# Patient Record
Sex: Female | Born: 1950 | Race: White | Hispanic: No | Marital: Married | Smoking: Never smoker
Health system: Southern US, Community
[De-identification: ages and names within clinical notes are randomized; demographics above are authoritative.]

## PROBLEM LIST (undated history)

## (undated) DIAGNOSIS — Z8679 Personal history of other diseases of the circulatory system: Secondary | ICD-10-CM

## (undated) DIAGNOSIS — Z9289 Personal history of other medical treatment: Secondary | ICD-10-CM

## (undated) DIAGNOSIS — Z860100 Personal history of colon polyps, unspecified: Secondary | ICD-10-CM

## (undated) DIAGNOSIS — I83813 Varicose veins of bilateral lower extremities with pain: Secondary | ICD-10-CM

## (undated) DIAGNOSIS — J4520 Mild intermittent asthma, uncomplicated: Secondary | ICD-10-CM

## (undated) DIAGNOSIS — Z973 Presence of spectacles and contact lenses: Secondary | ICD-10-CM

## (undated) DIAGNOSIS — E890 Postprocedural hypothyroidism: Secondary | ICD-10-CM

## (undated) DIAGNOSIS — I219 Acute myocardial infarction, unspecified: Secondary | ICD-10-CM

## (undated) DIAGNOSIS — E785 Hyperlipidemia, unspecified: Secondary | ICD-10-CM

## (undated) DIAGNOSIS — Z8639 Personal history of other endocrine, nutritional and metabolic disease: Secondary | ICD-10-CM

## (undated) DIAGNOSIS — R112 Nausea with vomiting, unspecified: Secondary | ICD-10-CM

## (undated) DIAGNOSIS — H814 Vertigo of central origin: Secondary | ICD-10-CM

## (undated) DIAGNOSIS — G4733 Obstructive sleep apnea (adult) (pediatric): Secondary | ICD-10-CM

## (undated) DIAGNOSIS — S83206A Unspecified tear of unspecified meniscus, current injury, right knee, initial encounter: Secondary | ICD-10-CM

## (undated) DIAGNOSIS — Z9889 Other specified postprocedural states: Secondary | ICD-10-CM

## (undated) DIAGNOSIS — K219 Gastro-esophageal reflux disease without esophagitis: Secondary | ICD-10-CM

## (undated) HISTORY — DX: Acute myocardial infarction, unspecified: I21.9

## (undated) HISTORY — DX: Hyperlipidemia, unspecified: E78.5

## (undated) HISTORY — DX: Vertigo of central origin: H81.4

## (undated) HISTORY — PX: LAPAROSCOPIC GASTRIC SLEEVE RESECTION: SHX5895

## (undated) HISTORY — PX: LAPAROSCOPIC CHOLECYSTECTOMY: SUR755

## (undated) HISTORY — DX: Gastro-esophageal reflux disease without esophagitis: K21.9

## (undated) HISTORY — DX: Varicose veins of bilateral lower extremities with pain: I83.813

## (undated) HISTORY — PX: TONSILLECTOMY AND ADENOIDECTOMY: SUR1326

---

## 1989-03-23 HISTORY — PX: THYROID LOBECTOMY: SHX420

## 1993-03-23 HISTORY — PX: VAGINAL HYSTERECTOMY: SUR661

## 1998-02-22 ENCOUNTER — Ambulatory Visit (HOSPITAL_COMMUNITY): Admission: RE | Admit: 1998-02-22 | Discharge: 1998-02-22 | Payer: Self-pay | Admitting: Gynecology

## 1998-02-22 ENCOUNTER — Encounter: Payer: Self-pay | Admitting: Gynecology

## 1998-05-28 ENCOUNTER — Other Ambulatory Visit: Admission: RE | Admit: 1998-05-28 | Discharge: 1998-05-28 | Payer: Self-pay | Admitting: Gynecology

## 1998-09-04 ENCOUNTER — Ambulatory Visit (HOSPITAL_COMMUNITY): Admission: RE | Admit: 1998-09-04 | Discharge: 1998-09-04 | Payer: Self-pay | Admitting: Family Medicine

## 1999-03-06 ENCOUNTER — Encounter: Payer: Self-pay | Admitting: Gynecology

## 1999-03-06 ENCOUNTER — Ambulatory Visit (HOSPITAL_COMMUNITY): Admission: RE | Admit: 1999-03-06 | Discharge: 1999-03-06 | Payer: Self-pay | Admitting: Gynecology

## 2000-11-24 ENCOUNTER — Ambulatory Visit (HOSPITAL_COMMUNITY): Admission: RE | Admit: 2000-11-24 | Discharge: 2000-11-24 | Payer: Self-pay | Admitting: Obstetrics and Gynecology

## 2000-11-24 ENCOUNTER — Encounter: Payer: Self-pay | Admitting: Obstetrics and Gynecology

## 2002-08-01 ENCOUNTER — Other Ambulatory Visit: Admission: RE | Admit: 2002-08-01 | Discharge: 2002-08-01 | Payer: Self-pay | Admitting: Gynecology

## 2002-08-28 ENCOUNTER — Ambulatory Visit (HOSPITAL_COMMUNITY): Admission: RE | Admit: 2002-08-28 | Discharge: 2002-08-28 | Payer: Self-pay | Admitting: Neurosurgery

## 2002-08-28 ENCOUNTER — Encounter: Payer: Self-pay | Admitting: Neurosurgery

## 2006-01-12 ENCOUNTER — Other Ambulatory Visit: Admission: RE | Admit: 2006-01-12 | Discharge: 2006-01-12 | Payer: Self-pay | Admitting: *Deleted

## 2007-01-21 ENCOUNTER — Other Ambulatory Visit: Admission: RE | Admit: 2007-01-21 | Discharge: 2007-01-21 | Payer: Self-pay | Admitting: Family Medicine

## 2007-03-24 LAB — HM COLONOSCOPY: HM Colonoscopy: NORMAL

## 2008-07-13 ENCOUNTER — Ambulatory Visit: Payer: Self-pay | Admitting: Endocrinology

## 2008-07-13 DIAGNOSIS — I10 Essential (primary) hypertension: Secondary | ICD-10-CM | POA: Insufficient documentation

## 2008-07-13 DIAGNOSIS — R635 Abnormal weight gain: Secondary | ICD-10-CM | POA: Insufficient documentation

## 2008-07-13 DIAGNOSIS — K219 Gastro-esophageal reflux disease without esophagitis: Secondary | ICD-10-CM | POA: Insufficient documentation

## 2008-07-13 DIAGNOSIS — E890 Postprocedural hypothyroidism: Secondary | ICD-10-CM | POA: Insufficient documentation

## 2008-07-16 ENCOUNTER — Telehealth: Payer: Self-pay | Admitting: Endocrinology

## 2008-07-16 ENCOUNTER — Ambulatory Visit: Payer: Self-pay | Admitting: Endocrinology

## 2008-07-16 LAB — CONVERTED CEMR LAB: TSH: 0.91 microintl units/mL (ref 0.35–5.50)

## 2008-09-13 ENCOUNTER — Encounter: Admission: RE | Admit: 2008-09-13 | Discharge: 2008-09-13 | Payer: Self-pay | Admitting: Neurosurgery

## 2008-09-20 LAB — CONVERTED CEMR LAB: Pap Smear: NORMAL

## 2008-10-05 ENCOUNTER — Other Ambulatory Visit: Admission: RE | Admit: 2008-10-05 | Discharge: 2008-10-05 | Payer: Self-pay | Admitting: Family Medicine

## 2008-10-11 ENCOUNTER — Encounter: Admission: RE | Admit: 2008-10-11 | Discharge: 2008-10-11 | Payer: Self-pay | Admitting: Neurosurgery

## 2008-10-25 ENCOUNTER — Ambulatory Visit (HOSPITAL_BASED_OUTPATIENT_CLINIC_OR_DEPARTMENT_OTHER): Admission: RE | Admit: 2008-10-25 | Discharge: 2008-10-25 | Payer: Self-pay | Admitting: Family Medicine

## 2008-10-25 ENCOUNTER — Ambulatory Visit: Payer: Self-pay | Admitting: Diagnostic Radiology

## 2009-04-11 ENCOUNTER — Encounter: Payer: Self-pay | Admitting: Family Medicine

## 2009-06-21 LAB — HM MAMMOGRAPHY: HM Mammogram: NORMAL

## 2009-06-28 ENCOUNTER — Encounter: Payer: Self-pay | Admitting: Family Medicine

## 2009-09-06 ENCOUNTER — Ambulatory Visit (HOSPITAL_COMMUNITY): Admission: RE | Admit: 2009-09-06 | Discharge: 2009-09-06 | Payer: Self-pay | Admitting: Surgery

## 2009-09-12 ENCOUNTER — Ambulatory Visit (HOSPITAL_COMMUNITY): Admission: RE | Admit: 2009-09-12 | Discharge: 2009-09-12 | Payer: Self-pay | Admitting: Surgery

## 2009-09-13 ENCOUNTER — Encounter: Admission: RE | Admit: 2009-09-13 | Discharge: 2009-12-12 | Payer: Self-pay | Admitting: Surgery

## 2009-09-19 ENCOUNTER — Encounter: Payer: Self-pay | Admitting: Family Medicine

## 2009-09-25 ENCOUNTER — Ambulatory Visit (HOSPITAL_BASED_OUTPATIENT_CLINIC_OR_DEPARTMENT_OTHER): Admission: RE | Admit: 2009-09-25 | Discharge: 2009-09-25 | Payer: Self-pay | Admitting: Surgery

## 2009-10-05 ENCOUNTER — Ambulatory Visit: Payer: Self-pay | Admitting: Internal Medicine

## 2009-10-10 ENCOUNTER — Ambulatory Visit: Payer: Self-pay | Admitting: Family Medicine

## 2009-10-10 DIAGNOSIS — E559 Vitamin D deficiency, unspecified: Secondary | ICD-10-CM | POA: Insufficient documentation

## 2009-10-10 DIAGNOSIS — J45909 Unspecified asthma, uncomplicated: Secondary | ICD-10-CM | POA: Insufficient documentation

## 2009-10-10 DIAGNOSIS — M545 Low back pain, unspecified: Secondary | ICD-10-CM | POA: Insufficient documentation

## 2009-10-10 DIAGNOSIS — E785 Hyperlipidemia, unspecified: Secondary | ICD-10-CM | POA: Insufficient documentation

## 2009-10-10 DIAGNOSIS — M810 Age-related osteoporosis without current pathological fracture: Secondary | ICD-10-CM | POA: Insufficient documentation

## 2009-10-10 DIAGNOSIS — M79609 Pain in unspecified limb: Secondary | ICD-10-CM | POA: Insufficient documentation

## 2009-10-10 DIAGNOSIS — E119 Type 2 diabetes mellitus without complications: Secondary | ICD-10-CM | POA: Insufficient documentation

## 2009-10-11 ENCOUNTER — Telehealth: Payer: Self-pay | Admitting: Family Medicine

## 2009-10-14 ENCOUNTER — Telehealth: Payer: Self-pay | Admitting: Family Medicine

## 2009-10-14 LAB — CONVERTED CEMR LAB
Creatinine,U: 119.3 mg/dL
Microalb Creat Ratio: 1 mg/g (ref 0.0–30.0)
Microalb, Ur: 1.2 mg/dL (ref 0.0–1.9)
Vit D, 25-Hydroxy: 58 ng/mL (ref 30–89)
Vitamin B-12: 231 pg/mL (ref 211–911)

## 2009-10-16 ENCOUNTER — Telehealth: Payer: Self-pay | Admitting: Family Medicine

## 2009-10-22 ENCOUNTER — Telehealth (INDEPENDENT_AMBULATORY_CARE_PROVIDER_SITE_OTHER): Payer: Self-pay | Admitting: *Deleted

## 2009-11-05 ENCOUNTER — Telehealth: Payer: Self-pay | Admitting: Family Medicine

## 2009-11-15 ENCOUNTER — Encounter: Payer: Self-pay | Admitting: Family Medicine

## 2009-11-20 ENCOUNTER — Encounter: Payer: Self-pay | Admitting: Family Medicine

## 2009-12-02 ENCOUNTER — Telehealth: Payer: Self-pay | Admitting: Family Medicine

## 2009-12-05 ENCOUNTER — Ambulatory Visit: Payer: Self-pay | Admitting: Family Medicine

## 2009-12-24 ENCOUNTER — Telehealth: Payer: Self-pay | Admitting: Family Medicine

## 2009-12-26 ENCOUNTER — Encounter: Payer: Self-pay | Admitting: Family Medicine

## 2009-12-27 ENCOUNTER — Ambulatory Visit: Payer: Self-pay | Admitting: Family Medicine

## 2010-01-27 IMAGING — US US EXTREM LOW VENOUS BILAT
1 series · 14 of 24 positions shown · non-contrast
Comparison: None

CLINICAL DATA: History given of pain and swelling in the legs.

BILATERAL LOWER EXTREMITY VENOUS DUPLEX ULTRASOUND
TECHNIQUE: Gray-scale sonography with graded compression, as well
as color Doppler and duplex ultrasound, were performed to evaluate
the deep venous system of both lower extremities from the level of
the common femoral vein through the popliteal and proximal calf
veins. Spectral Doppler was utilized to evaluate flow at rest and
with distal augmentation maneuvers.

[Series 1: us extrem low venous bilat · 14 of 40 slices shown]
[im 1/40]
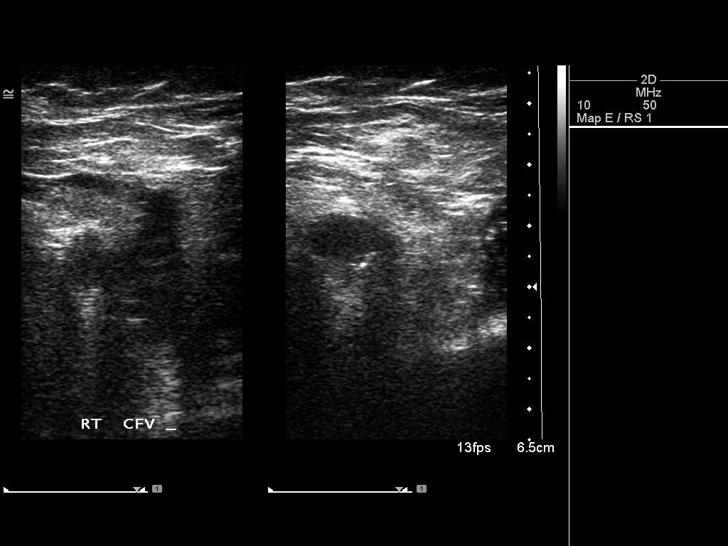
[im 4/40]
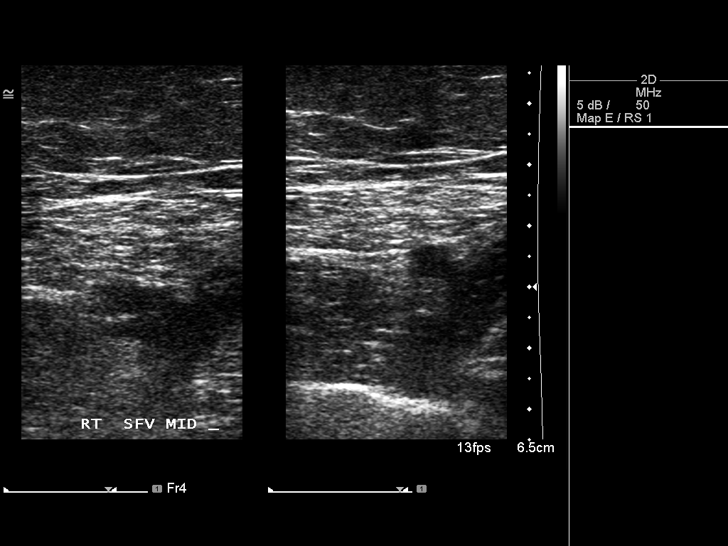
[im 7/40]
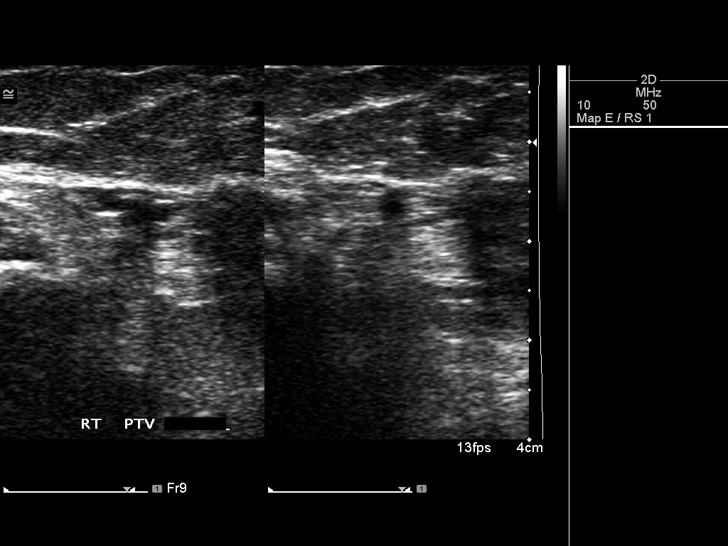
[im 11/40]
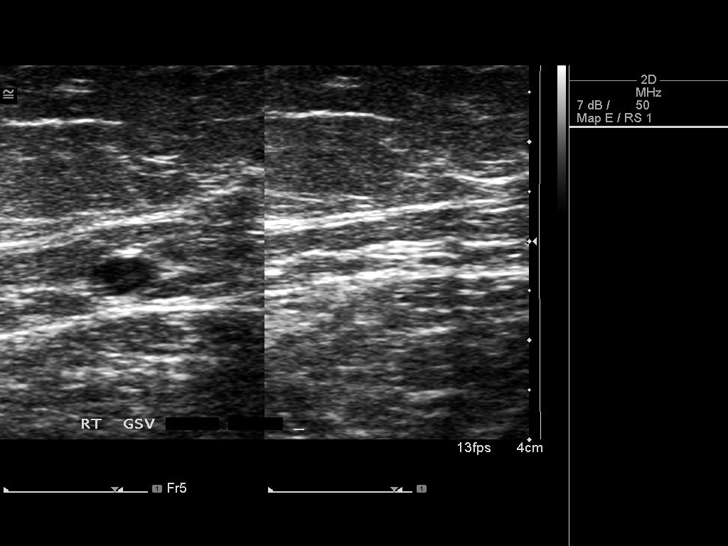
[im 12/40]
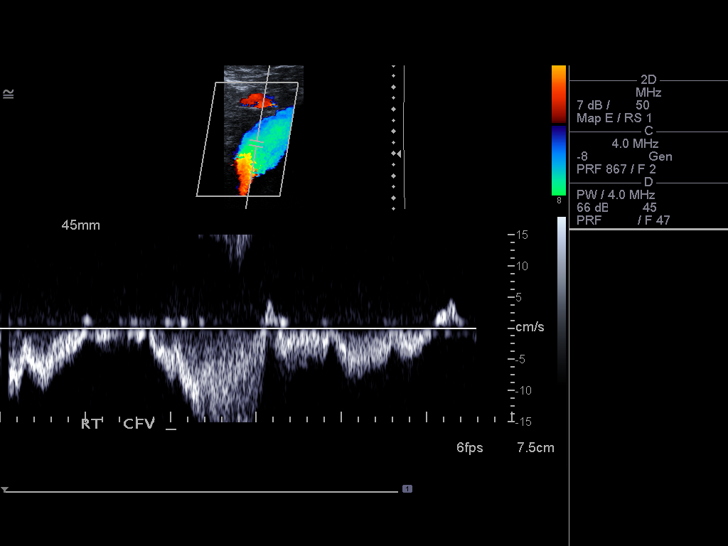
[im 16/40]
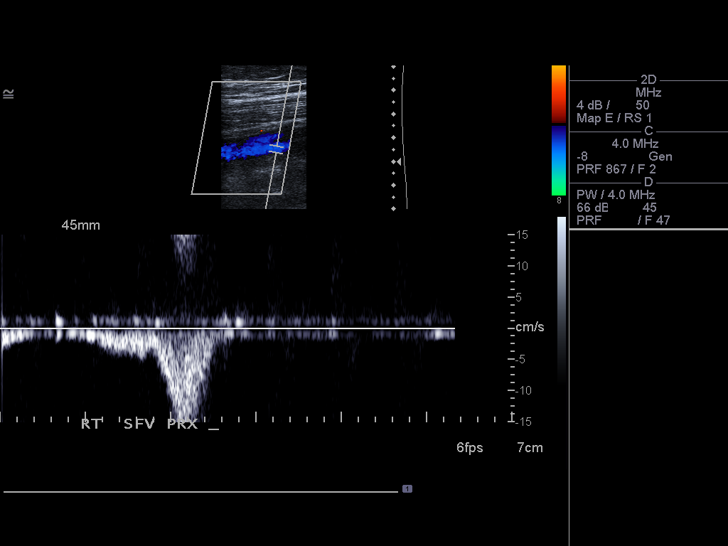
[im 19/40]
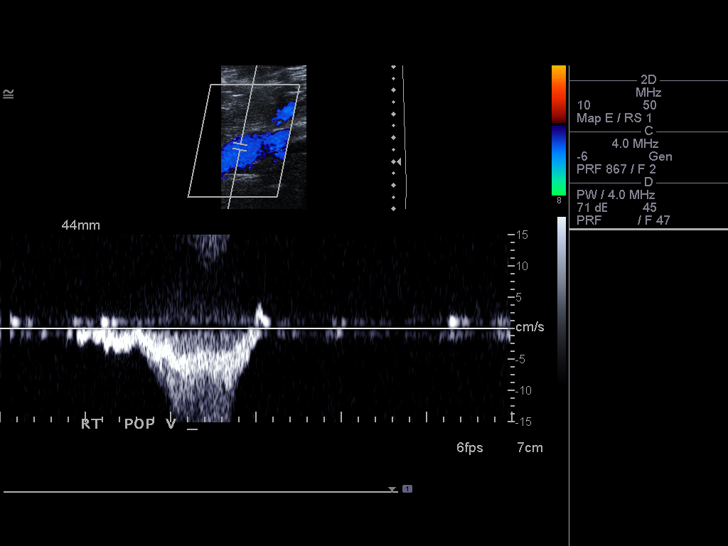
[im 21/40]
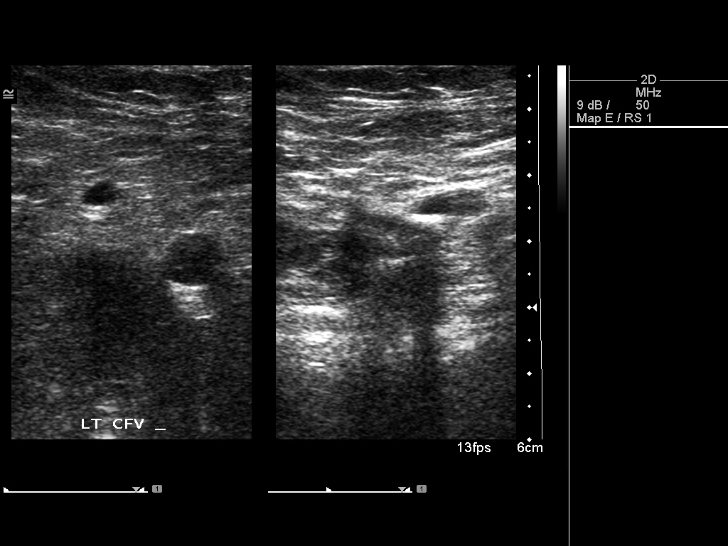
[im 24/40]
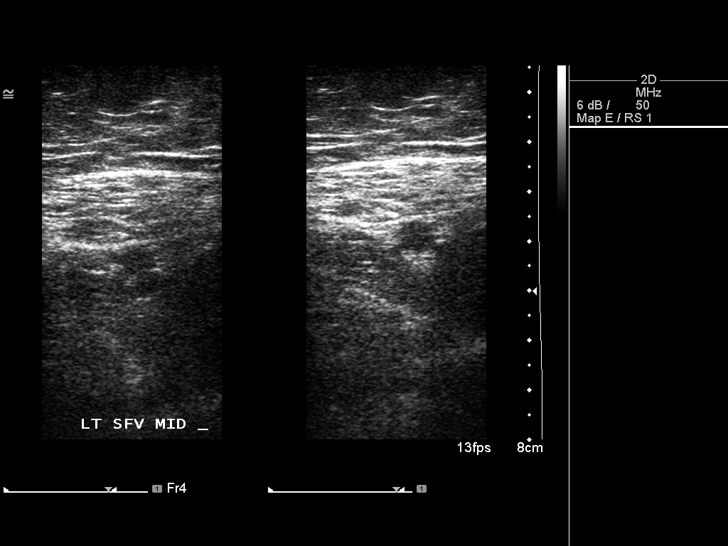
[im 28/40]
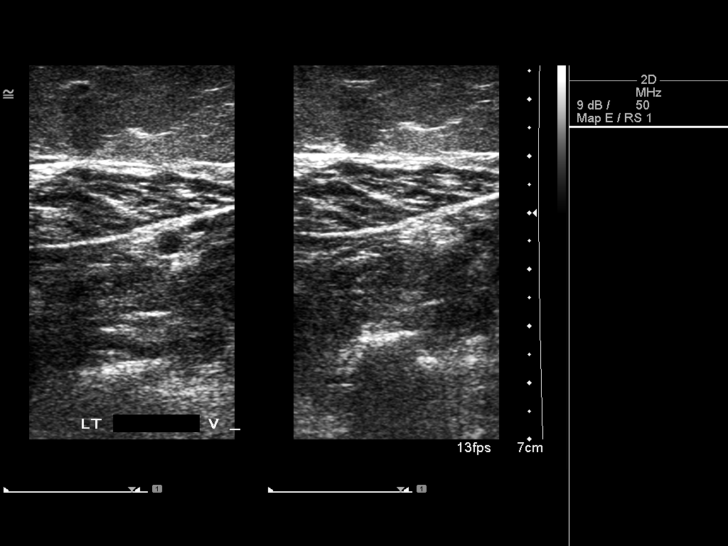
[im 31/40]
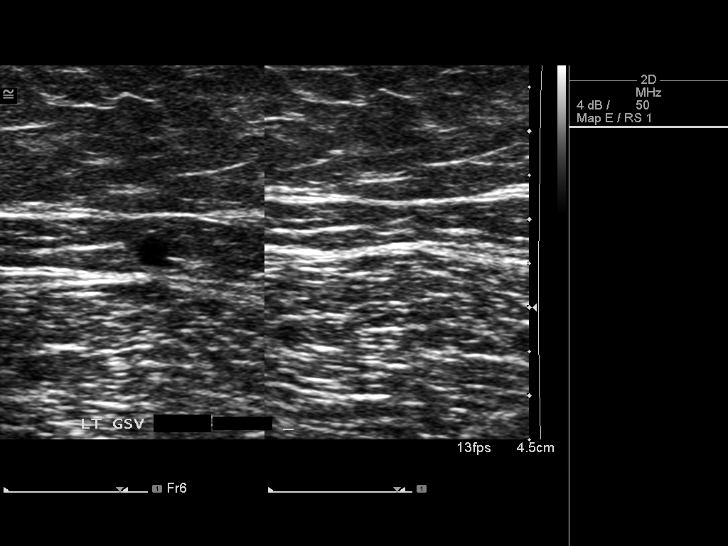
[im 33/40]
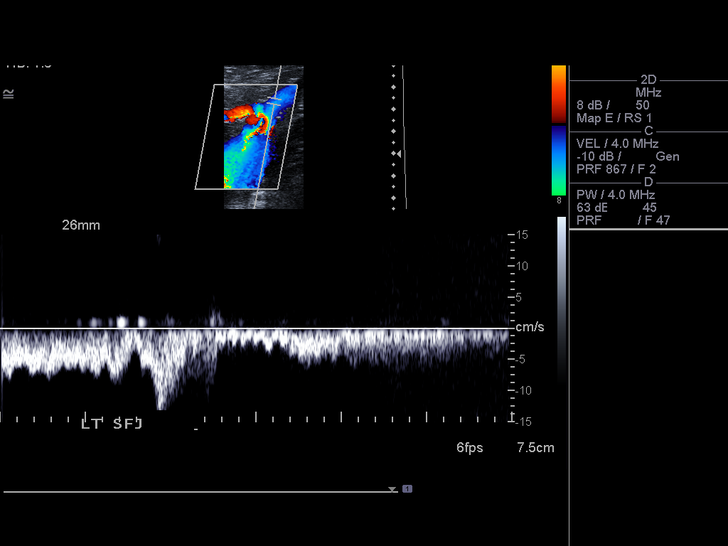
[im 36/40]
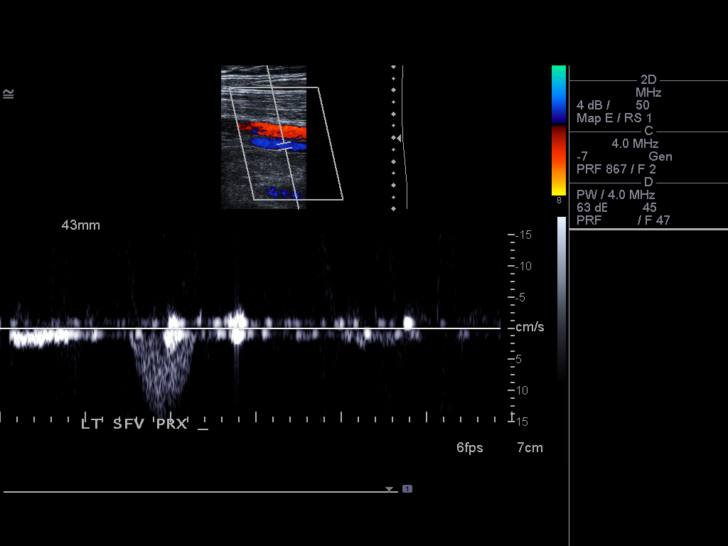
[im 40/40]
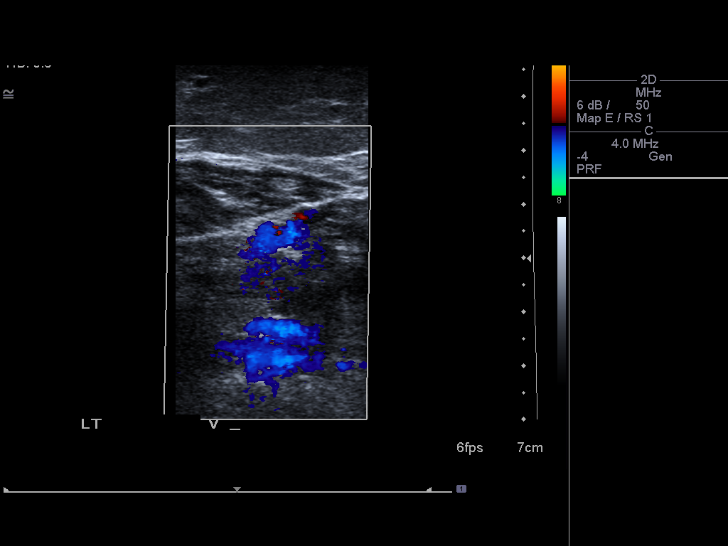

[14 of 24 positions shown; findings below may reference images not displayed]

FINDINGS: Normal compressibility of  bilateral common femoral,
superficial femoral, and popliteal veins is demonstrated, as well
as the visualized proximal calf veins.  No filling defects to
suggest DVT on grayscale or color Doppler imaging.  Doppler
waveforms show normal direction of venous flow, normal respiratory
phasicity and response to augmentation.
IMPRESSION: No evidence of bilateral lower extremity deep vein thrombosis.

## 2010-04-10 ENCOUNTER — Ambulatory Visit: Admit: 2010-04-10 | Payer: Self-pay | Admitting: Family Medicine

## 2010-04-13 ENCOUNTER — Encounter: Payer: Self-pay | Admitting: Surgery

## 2010-04-14 ENCOUNTER — Encounter: Payer: Self-pay | Admitting: Surgery

## 2010-04-22 NOTE — Progress Notes (Signed)
Summary: Pt cx the Doppler at [facility], because pt had it done at Dr. Ruffin Frederick  Phone Note Call from Patient Call back at Work Phone [phone] Call back at x [phone]   Caller: Patient Summary of Call: Pt called and said that she went to see her surgeon, Dr. Adolphus Birchwood, yesterday and he permformed the Doppler that was ordered, so she cancelled the Doppler for 10/11/09 at [facility].  Doppler results did not show anything wrong. Pt says that Dr. Adolphus Birchwood, is sending report over to Dr. Clent Ridges.   Initial call taken by: Lucy Antigua,  October 11, 2009 8:34 AM  Follow-up for Phone Call        noted Follow-up by: Nelwyn Salisbury MD,  October 11, 2009 8:44 AM

## 2010-04-22 NOTE — Assessment & Plan Note (Signed)
Summary: PROLIA INJ/CJR  rt gluteal sub q   Nurse Visit   Allergies: No Known Drug Allergies  Medication Administration  Injection # 1:    Medication: Prolia 60mg     Diagnosis: OSTEOPOROSIS (ICD-733.00)    Route: SQ    Site: R deltoid    Exp Date: 02/2011    Lot #: [id number]    Mfr: amgen    Comments: instructed pt to wait in procedure room 15 minutes to make sure no adverse reactions.      Patient tolerated injection without complications    Given by: Pura Spice, RN (December 27, 2009 3:17 PM)  Injection # 2:    Comments: pt left office without any noted problems to rtc 6 months to repeat inj.     Given by: Pura Spice, RN (December 27, 2009 3:27 PM)  Orders Added: 1)  Prolia 60mg  [J3590] 2)  Admin of Therapeutic Inj  intramuscular or subcutaneous [45409]

## 2010-04-22 NOTE — Letter (Signed)
Summary: Diagnostic Reports from [place] at [facility] 2001 - 2011  Diagnostic Reports from [place] at [facility] 2001 - 2011   Imported By: Maryln Gottron 10/15/2009 11:29:35  _____________________________________________________________________  External Attachment:    Type:   Image     Comment:   External Document

## 2010-04-22 NOTE — Letter (Signed)
Summary: [facility]  [facility]   Imported By: Maryln Gottron 12/10/2009 08:58:06  _____________________________________________________________________  External Attachment:    Type:   Image     Comment:   External Document

## 2010-04-22 NOTE — Medication Information (Signed)
Summary: Denosumab Approved  Denosumab Approved   Imported By: Maryln Gottron 01/07/2010 14:05:30  _____________________________________________________________________  External Attachment:    Type:   Image     Comment:   External Document

## 2010-04-22 NOTE — Progress Notes (Signed)
Summary: Pt has stopped Crestor and Micardis after Bariactric surgery  Phone Note Call from Patient   Summary of Call: Pt left message that she has stopped Crestor and Micardis since her Bariactric surgery.  Advised office visit and left message to call back for appt. Initial call taken by: Lynann Beaver CMA,  December 02, 2009 8:54 AM  Follow-up for Phone Call        Pt called back and has sch an ov for Thurs 12/05/09 at 8:45am, as noted above. Pt said that she could not come in any sooner, because she just started back to work after being out for surgery.  Follow-up by: Lucy Antigua,  December 02, 2009 10:01 AM  Additional Follow-up for Phone Call Additional follow up Details #1::        noted Additional Follow-up by: Nelwyn Salisbury MD,  December 02, 2009 12:58 PM

## 2010-04-22 NOTE — Op Note (Signed)
Summary: Laparoscopic Sleeve Gastrectomy/[facility]  Laparoscopic Sleeve Gastrectomy/[facility]   Imported By: Maryln Gottron 12/10/2009 09:00:31  _____________________________________________________________________  External Attachment:    Type:   Image     Comment:   External Document

## 2010-04-22 NOTE — Assessment & Plan Note (Signed)
Summary: to be est/fasting/njr   Vital Signs:  Patient profile:   60 year old female Weight:      291 pounds BMI:     47.14 Pulse rate:   84 / minute Pulse rhythm:   regular BP sitting:   132 / 78  (left arm) Cuff size:   large  Vitals Entered By: Raechel Ache, RN (October 10, 2009 9:03 AM) CC: To Establish, for CPX - not fasting.   History of Present Illness: 60 yr old female to establish with [place] and for a cpx. In general she feels good but she does mention one month of aching pains in the back of the left leg, from the knee to the mid calf. No recent trauma. No pain in the right leg. She apparently had dopplers to both legs about 6 months ago that were negative. Her main problem is her morbid obesity, and this is causing a host of other problems. She had gone through an intense workup with Dr. Wenda Low for a planned lap band surgery, but now her insurance has changed. Now she is seeing a Careers adviser by the name of Dr. Adolphus Birchwood at the [facility] in [facility], and she is scheduled to have a sleeve gastrectomy on 11-13-09. Last month she had a full preoperative workup including labs, CXR, EKG, etc., and these were remarkable for a fasting glucose of 126, a TG of over 200, and an LDL of 95. After being treated for vitamin D deficiency, her last vitamin D level last winter was normal at 22. She had a normal mammogram earlier this year.   Preventive Screening-Counseling & Management  Alcohol-Tobacco     Smoking Status: never  Caffeine-Diet-Exercise     Does Patient Exercise: no      Drug Use:  no.    Allergies (verified): No Known Drug Allergies  Past History:  Past Medical History: HYPOTHYROIDISM, POSTSURGICAL (ICD-244.0), sees Dr. Romero Belling WEIGHT GAIN [phone]) GERD (ICD-530.81) HYPERTENSION (ICD-401.9) Asthma, sees Dr. Stevphen Rochester Diabetes mellitus, type II Osteoporosis, last DEXA 10-2008 Hyperlipidemia Low back pain, sees Dr. 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 Alcoholism-parents mother had hypothyroidism mother weighed 200 lbs father was lean Family History of Arthritis Family History of CAD Female 1st degree relative <50 Family History High cholesterol Family History Hypertension  Social History: Reviewed history from 07/13/2008 and no changes required. married works hr at [employer] Never Smoked Alcohol use-yes Drug use-no Regular exercise-no Smoking Status:  never Drug Use:  no Does Patient Exercise:  no  Review of Systems  The patient denies anorexia, fever, weight loss, vision loss, decreased hearing, hoarseness, chest pain, syncope, dyspnea on exertion, peripheral edema, prolonged cough, headaches, hemoptysis, abdominal pain, melena, hematochezia, severe indigestion/heartburn, hematuria, incontinence, genital sores, muscle weakness, suspicious skin lesions, transient blindness, difficulty walking, depression, unusual weight change, abnormal bleeding, enlarged lymph nodes, angioedema, breast masses, and testicular masses.    Physical Exam  General:  morbidly obese Head:  Normocephalic and atraumatic without obvious abnormalities. No apparent alopecia or balding. Eyes:  No corneal or conjunctival inflammation noted. EOMI. Perrla. Funduscopic exam benign, without hemorrhages, exudates or papilledema. Vision grossly normal. Ears:  External ear exam  shows no significant lesions or deformities.  Otoscopic examination reveals clear canals, tympanic membranes are intact bilaterally without bulging, retraction, inflammation or discharge. Hearing is grossly normal bilaterally. Nose:  External nasal  examination shows no deformity or inflammation. Nasal mucosa are pink and moist without lesions or exudates. Mouth:  Oral mucosa and oropharynx without lesions or exudates.  Teeth in good repair. Neck:  No deformities, masses, or tenderness noted. Chest Wall:  No deformities, masses, or tenderness noted. Breasts:  No mass, nodules, thickening, tenderness, bulging, retraction, inflamation, nipple discharge or skin changes noted.   Lungs:  Normal respiratory effort, chest expands symmetrically. Lungs are clear to auscultation, no crackles or wheezes. Heart:  Normal rate and regular rhythm. S1 and S2 normal without gallop, murmur, click, rub or other extra sounds. Abdomen:  Bowel sounds positive,abdomen soft and non-tender without masses, organomegaly or hernias noted. Msk:  No deformity or scoliosis noted of thoracic or lumbar spine.   Pulses:  R and L carotid,radial,femoral,dorsalis pedis and posterior tibial pulses are full and equal bilaterally Extremities:  No clubbing, cyanosis, edema, or deformity noted with normal full range of motion of all joints.  She is mildly tender behind the right knee and in the right calf, and she is very tender behind the left knee and in the left calf. Denna Haggard is negative bilaterally. her obesity makes it difficult to palpate for cords. Neurologic:  No cranial nerve deficits noted. Station and gait are normal. Plantar reflexes are down-going bilaterally. DTRs are symmetrical throughout. Sensory, motor and coordinative functions appear intact. Skin:  Intact without suspicious lesions or rashes Cervical Nodes:  No lymphadenopathy noted Axillary Nodes:  No palpable lymphadenopathy Inguinal Nodes:  No significant adenopathy Psych:  Cognition and judgment appear intact. Alert and cooperative with normal attention span and concentration. No apparent delusions, illusions, hallucinations   Impression & Recommendations:  Problem # 1:  HEALTH MAINTENANCE EXAM  (ICD-V70.0)  Complete Medication List: 1)  Synthroid 112 Mcg Tabs (Levothyroxine sodium) .Marland Kitchen.. 1 tab qam 2)  Cytomel 5 Mcg Tabs (Liothyronine sodium) .Marland Kitchen.. 1 qam 3)  Micardis 80 Mg Tabs (Telmisartan) .Marland Kitchen.. 1 tab qam 4)  Multiple Vitamin-folic Acid Tabs (Multiple vitamin) .Marland Kitchen.. 1 daily 5)  Biotin 1000 Mcg Tabs (Biotin) .Marland Kitchen.. 1 daily 6)  Fish Oil Oil (Fish oil) .Marland Kitchen.. 1 daily 7)  Flax Seed Oil 1000 Mg Caps (Flaxseed (linseed)) .Marland Kitchen.. 1 daily 8)  Calcium 2000  .Marland Kitchen.. 1 daily 9)  Glucosamine-chondroitin 1500-1200 Mg/18ml Liqd (Glucosamine-chondroitin) .Marland Kitchen.. 1 daily 10)  Crestor 10 Mg Tabs (Rosuvastatin calcium) .... Once daily 11)  Astepro 0.15 % Soln (Azelastine hcl) .... Once daily 12)  Prolia 60 Mg/ml Soln (Denosumab) .... Once every 6 months  Other Orders: Venipuncture (81191) TLB-A1C / Hgb A1C (Glycohemoglobin) (83036-A1C) TLB-Microalbumin/Creat Ratio, Urine (82043-MALB) TLB-B12, Serum-Total ONLY (47829-F62) T-Vitamin D (25-Hydroxy) (13086-57846) Doppler Referral (Doppler)  Patient Instructions: 1)  We will get venous dopplers of both legs to rule out DVTs. Her obesity and sedentary lifestyle put her at risk for these. If negative, we will plan for her bariatric surgery as above. Check labs for her newly diagnosed DM.  Prescriptions: CRESTOR 10 MG TABS (ROSUVASTATIN CALCIUM) once daily  #90 x 3   Entered and Authorized by:   Nelwyn Salisbury MD   Signed by:   Nelwyn Salisbury MD on 10/10/2009   Method used:   [facility]ron[facility] to        [facility] [phone][facility] (retail)  [address]       [place], [place]  [address]       Ph: [phone] or [phone]       Fax: [phone]   RxID:   [id number] MICARDIS 80 MG TABS (TELMISARTAN) 1 tab qam  #90 x 3   Entered and Authorized by:   Nelwyn Salisbury MD   Signed by:   Nelwyn Salisbury MD on 10/10/2009   Method used:   Electronically to        [facility] [phone]* (retail)       [address]       [place], [place]   [address]       Ph: [phone] or [phone]       Fax: [phone]   RxID:   [id number] CYTOMEL 5 MCG TABS (LIOTHYRONINE SODIUM) 1 qam  #90 x 3   Entered and Authorized by:   Nelwyn Salisbury MD   Signed by:   Nelwyn Salisbury MD on 10/10/2009   Method used:   [facility]ronically [facility]        [facility] [phone]* (retail)       [address] [place], [place]  [address]       Ph: [phone] or [phone]       Fax: [phone]   RxID:   [id number] SYNTHROID 112 MCG TABS (LEVOTHYROXINE SODIUM) 1 tab qam Brand medically necessary #90 x 3   Entered and Authorized by:   Nelwyn Salisbury MD   Signed by:   Nelwyn Salisbury MD on 10/10/2009   Method used:   [facility]ronically [facility]        [facility]  [address] [phone]* (retail)       [address] [place], [place]  [address]       Ph: [phone] or [phone]       Fax: [phone]   RxID:   [id number]    Preventive Care Screening  Mammogram:    Date:  06/21/2009    Results:  normal   Pap Smear:    Date:  09/20/2008    Results:  normal   Colonoscopy:    Date:  03/24/2007    Results:  normal

## 2010-04-22 NOTE — Consult Note (Signed)
[facility]   Imported By: Maryln Gottron 10/16/2009 15:39:29  _____________________________________________________________________  External Attachment:    Type:   Image     Comment:   External Document

## 2010-04-22 NOTE — Progress Notes (Signed)
Summary: return call  Phone Note Call from Patient Call back at Home Phone [phone]   Caller: Patient Call For: dr fry Summary of Call: sonya please mail pt a copy of letter and also [facility] Initial call taken by: Heron Sabins,  October 22, 2009 8:44 AM  Follow-up for Phone Call        I mailed a copy and faxed a copy to [facility]ariatic center. Follow-up by: Josph Macho RMA,  October 23, 2009 8:11 AM

## 2010-04-22 NOTE — Progress Notes (Signed)
Summary: Pt req to get status of letter written to Dr. Adolphus Birchwood  Phone Note Call from Patient Call back at Work Phone [phone] Call back at ext [phone]   Caller: Patient Summary of Call: Pt called re: status of letter to Dr Adolphus Birchwood on Medical Neccessity. This letter was suppose to be sent to the attention of Cecille Po at fax#  [phone]  [facility] for [facility]. Pls call back asap.    Initial call taken by: Lucy Antigua,  October 16, 2009 9:09 AM  Follow-up for Phone Call        it is not ready yet, but should be soon Follow-up by: Nelwyn Salisbury MD,  October 16, 2009 9:27 AM  Additional Follow-up for Phone Call Additional follow up Details #1::        I called pt and notified her that letter not ready yet, but will be ready soon. Pt says that the lab would that Dr. Clent Ridges did last week, results need to be sent to them also in addition to the letter.  Pt also is req a copy for her records.  Additional Follow-up by: Lucy Antigua,  October 16, 2009 11:56 AM    Additional Follow-up for Phone Call Additional follow up Details #2::    will do Follow-up by: Nelwyn Salisbury MD,  October 16, 2009 11:58 AM

## 2010-04-22 NOTE — Letter (Signed)
Summary: Records from [facility] 2010 - 2011  Records from [facility] 2010 - 2011   Imported By: Maryln Gottron 10/03/2009 15:30:41  _____________________________________________________________________  External Attachment:    Type:   Image     Comment:   External Document

## 2010-04-22 NOTE — Progress Notes (Signed)
Summary: Sonata  Phone Note Call from Patient   Caller: Patient Call For: Dr. Clent Ridges Summary of Call: Pt is asking for Sonata for sleep.  Is on Bipap, and has trouble sleeping. [facility]  EXT [phone]  Initial call taken by: Lynann Beaver CMA,  November 05, 2009 4:54 PM  Follow-up for Phone Call        call in Sonata 10 mg at bedtime , #30 with 2 rf Follow-up by: Nelwyn Salisbury MD,  November 06, 2009 1:30 PM    New/Updated Medications: SONATA 10 MG CAPS (ZALEPLON) 1 at bedtime Prescriptions: SONATA 10 MG CAPS (ZALEPLON) 1 at bedtime  #30 x 2   Entered by:   Raechel Ache, RN   Authorized by:   Nelwyn Salisbury MD   Signed by:   Raechel Ache, RN on 11/06/2009   Method used:   Telephoned to ...       [facility] [facility] [phone]* (retail)       [address]       [place], [place]  [address]       Ph: [phone] or [phone]       Fax: [phone]   RxID:   [id number]

## 2010-04-22 NOTE — Progress Notes (Signed)
Summary: wants gina to return her call  Phone Note Call from Patient Call back at Work Phone [phone] Call back at x [phone]   Caller: Patient----live call Summary of Call: Was told to call  3 days advance for a Prolia inj. Wants to come this Friday. wants Almira Coaster to call her with confirmation. Initial call taken by: Warnell Forester,  December 24, 2009 11:06 AM  Follow-up for Phone Call        left mess to return call  Follow-up by: Pura Spice, RN,  December 24, 2009 11:54 AM  Additional Follow-up for Phone Call Additional follow up Details #1::        I called pt--pt had pa form for Prolia sent to gina and dr fry. Additional Follow-up by: Warnell Forester,  December 24, 2009 2:47 PM    Additional Follow-up for Phone Call Additional follow up Details #2::    spoke with pt  concerned about cost of inj stated insuracne has paid in past and wants to make sure its only 20. 00 copay.  infcormed pt to sch appt friday but should speak with our manager, Tim Lair  about the billing  Follow-up by: Pura Spice, RN,  December 25, 2009 4:28 PM

## 2010-04-22 NOTE — Progress Notes (Signed)
Summary: Pt req lab results. Pls call asap.  Phone Note Call from Patient Call back at Work Phone [phone] Call back at x [phone]   Caller: Patient Summary of Call: Pt req lab results. Pls call asap.    Initial call taken by: Lucy Antigua,  October 14, 2009 3:05 PM  Follow-up for Phone Call        Phone Call Completed Follow-up by: Raechel Ache, RN,  October 14, 2009 3:27 PM

## 2010-04-22 NOTE — Assessment & Plan Note (Signed)
Summary: bp check/pt stopped meds/cjr   Vital Signs:  Patient profile:   60 year old female Weight:      260 pounds O2 Sat:      94 % Temp:     98.5 degrees F Pulse rate:   99 / minute BP sitting:   140 / 82  (left arm) Cuff size:   large  Vitals Entered By: Pura Spice, RN (December 05, 2009 8:57 AM) CC: fojllow up BP states uses home  mointor at home and reading this am 107/65   History of Present Illness: Here to follow up after bariatric surgery 3 weeks ago. This went well, and she stopped taking Crestor and Micardis at that time. Since then her BP at home has been 90-110 systolic, and she feels fine. She has lost around 30 lbs since before this surgery.  Allergies: No Known Drug Allergies  Past History:  Past Medical History: Reviewed history from 10/10/2009 and no changes required. HYPOTHYROIDISM, POSTSURGICAL (ICD-244.0), sees Dr. Romero Belling WEIGHT GAIN [phone]) GERD (ICD-530.81) HYPERTENSION (ICD-401.9) Asthma, sees Dr. Stevphen Rochester Diabetes mellitus, type II Osteoporosis, last DEXA 10-2008 Hyperlipidemia Low back pain, sees Dr. 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 Adolphus Birchwood in [facility]  Review of Systems  The patient denies anorexia, fever, weight gain, vision loss, decreased hearing, hoarseness, chest pain, syncope, dyspnea on exertion, peripheral edema, prolonged cough, headaches, hemoptysis, abdominal pain, melena, hematochezia, severe indigestion/heartburn, hematuria, incontinence, genital sores, muscle weakness, suspicious skin lesions, transient blindness, difficulty walking, depression, unusual weight change, abnormal bleeding, enlarged lymph nodes,  angioedema, breast masses, and testicular masses.         Flu Vaccine Consent Questions     Do you have a history of severe allergic reactions to this vaccine? no    Any prior history of allergic reactions to egg and/or gelatin? no    Do you have a sensitivity to the preservative Thimersol? no    Do you have a past history of Guillan-Barre Syndrome? no    Do you currently have an acute febrile illness? no    Have you ever had a severe reaction to latex? no    Vaccine information given and explained to patient? yes    Are you currently pregnant? no    Lot Number:[id number]   Exp Date:09/20/2010   Site Given  Left Deltoid IM Pura Spice, RN  December 05, 2009 9:32 AM   Physical Exam  General:  overweight-appearing.   Neck:  No deformities, masses, or tenderness noted. Lungs:  Normal respiratory effort, chest expands symmetrically. Lungs are clear to auscultation, no crackles or wheezes. Heart:  Normal rate and regular rhythm. S1 and S2 normal without gallop, murmur, click, rub or other extra sounds.   Impression & Recommendations:  Problem # 1:  HYPERLIPIDEMIA (ICD-272.4)  The following medications were removed from the medication list:    Crestor 10 Mg Tabs (Rosuvastatin calcium) ..... Once daily  Problem # 2:  DIABETES MELLITUS, TYPE II (ICD-250.00)  The following medications were removed from the medication list:    Micardis 80 Mg Tabs (Telmisartan) .Marland Kitchen... 1 tab qam  Problem # 3:  HYPERTENSION (ICD-401.9)  The following medications were removed from the medication  list:    Micardis 80 Mg Tabs (Telmisartan) .Marland Kitchen... 1 tab qam  Problem # 4:  MORBID OBESITY (ICD-278.01)  Complete Medication List: 1)  Synthroid 112 Mcg Tabs (Levothyroxine sodium) .Marland Kitchen.. 1 tab qam 2)  Cytomel 5 Mcg Tabs (Liothyronine sodium) .Marland Kitchen.. 1 qam 3)  Multiple Vitamin-folic Acid Tabs (Multiple vitamin) .Marland Kitchen.. 1 daily 4)  Calcium 2000  .Marland Kitchen.. 1 daily 5)  Glucosamine-chondroitin 1500-1200 Mg/48ml Liqd  (Glucosamine-chondroitin) .Marland Kitchen.. 1 daily 6)  Prolia 60 Mg/ml Soln (Denosumab) .... Once every 6 months  Other Orders: Admin 1st Vaccine (84696) Flu Vaccine 17yrs + (29528)  Patient Instructions: 1)  Her Bp seems to be stable off meds, so we will continue off Micardis. I encouraged her to begin walking on her treadmill again. She will return in 3 months to recheck her BP and for fasting labs.

## 2010-04-25 NOTE — Letter (Signed)
Summary: Office Notes from [place] at [facility] 2001 - 2011  Office Notes from [place] at [facility] 2001 - 2011   Imported By: Maryln Gottron 10/15/2009 11:33:58  _____________________________________________________________________  External Attachment:    Type:   Image     Comment:   External Document

## 2010-05-19 ENCOUNTER — Telehealth: Payer: Self-pay | Admitting: Family Medicine

## 2010-05-19 NOTE — Telephone Encounter (Signed)
Pt states she was told on last week that Dr. Clent Ridges would not order labs for you when she comes in on 3/23 for her 6 mnth f/u, was told she needs to go to free standing lab.  Pt is confused please clarify.

## 2010-05-20 NOTE — Telephone Encounter (Signed)
Please call her and find out what she needs. I have no idea what this is about

## 2010-05-21 NOTE — Telephone Encounter (Signed)
Would you triage this for me?  thanks

## 2010-06-02 ENCOUNTER — Encounter: Payer: Self-pay | Admitting: *Deleted

## 2010-06-11 ENCOUNTER — Encounter: Payer: Self-pay | Admitting: Family Medicine

## 2010-06-13 ENCOUNTER — Ambulatory Visit: Payer: Self-pay | Admitting: Family Medicine

## 2010-06-13 ENCOUNTER — Ambulatory Visit (INDEPENDENT_AMBULATORY_CARE_PROVIDER_SITE_OTHER): Payer: Self-pay | Admitting: Family Medicine

## 2010-06-13 ENCOUNTER — Encounter: Payer: Self-pay | Admitting: Family Medicine

## 2010-06-13 DIAGNOSIS — M810 Age-related osteoporosis without current pathological fracture: Secondary | ICD-10-CM

## 2010-06-13 DIAGNOSIS — J45909 Unspecified asthma, uncomplicated: Secondary | ICD-10-CM

## 2010-06-13 DIAGNOSIS — E119 Type 2 diabetes mellitus without complications: Secondary | ICD-10-CM

## 2010-06-13 DIAGNOSIS — I83813 Varicose veins of bilateral lower extremities with pain: Secondary | ICD-10-CM

## 2010-06-13 DIAGNOSIS — E785 Hyperlipidemia, unspecified: Secondary | ICD-10-CM

## 2010-06-13 DIAGNOSIS — I10 Essential (primary) hypertension: Secondary | ICD-10-CM

## 2010-06-13 DIAGNOSIS — I83893 Varicose veins of bilateral lower extremities with other complications: Secondary | ICD-10-CM

## 2010-06-13 DIAGNOSIS — E890 Postprocedural hypothyroidism: Secondary | ICD-10-CM

## 2010-06-13 DIAGNOSIS — E559 Vitamin D deficiency, unspecified: Secondary | ICD-10-CM

## 2010-06-13 DIAGNOSIS — R079 Chest pain, unspecified: Secondary | ICD-10-CM | POA: Insufficient documentation

## 2010-06-13 DIAGNOSIS — E669 Obesity, unspecified: Secondary | ICD-10-CM

## 2010-06-13 MED ORDER — ZOLPIDEM TARTRATE 10 MG PO TABS: 10.0000 mg | ORAL_TABLET | Freq: Every evening | ORAL | Status: DC | PRN

## 2010-06-13 MED ORDER — NITROGLYCERIN 0.4 MG SL SUBL: 0.4000 mg | SUBLINGUAL_TABLET | SUBLINGUAL | Status: DC | PRN

## 2010-06-13 MED ORDER — RANITIDINE HCL 150 MG PO TABS: 150.0000 mg | ORAL_TABLET | Freq: Every day | ORAL | Status: DC

## 2010-06-13 MED ORDER — ASPIRIN EC 81 MG PO TBEC: 81.0000 mg | DELAYED_RELEASE_TABLET | Freq: Every day | ORAL | Status: DC

## 2010-06-13 MED ORDER — DIAZEPAM 2 MG PO TABS: 2.0000 mg | ORAL_TABLET | Freq: Two times a day (BID) | ORAL | Status: DC | PRN

## 2010-06-13 NOTE — Patient Instructions (Addendum)
Obesity Obesity is defined as having a Body Mass Index (BMI) of 30 or more. To calculate your BMI divide your weight in pounds by your height in inches squared and multiply that product by 703. Major illnesses resulting from long-term obesity include:  Stroke.   Heart disease.   Diabetes.   Many cancers.   Arthritis.  Obesity also complicates recovery from many other medical problems.  CAUSES  A history of obesity in your parents.   Thyroid hormone imbalance.   Environmental factors such as excess calorie intake and physical inactivity.  TREATMENT A healthy weight loss program includes:  A calorie restricted diet based on individual calorie needs.   Increased physical activity (exercise).  An exercise program is just as important as the right low calorie diet.  Weight-loss medicines should be used only under the supervision of your physician. These medicines help, but only if they are used with diet and exercise programs. Medicines can have side effects including nervousness, nausea, abdominal pain, diarrhea, headache, drowsiness, and depression.  An unhealthy weight loss program includes:  Fasting.   Fad diets.   Supplements and drugs.  These choices do not succeed in long-term weight control.  HOME CARE INSTRUCTIONS To help you make the needed dietary changes:   Keep a daily record of everything you eat. There are many free websites to help you with this. It may be helpful to measure your foods so you can determine if you are eating the correct portion sizes.   Use low-calorie cookbooks or take special cooking classes.   Avoid alcohol. Drink more water and drinks with no calories.   Take vitamins and supplements only as recommended by your caregiver.   Weight-loss support groups, Registered Dieticians, counselors, and stress reduction education can also be very helpful.  PREVENTION Losing weight and keeping it off takes time, discipline, a healthy diet and regular  exercise. Document Released: 04/16/2004 Document Re-Released: 03/29/2007 Exit[employer] Patient Information 2011 [employer]. 

## 2010-06-16 ENCOUNTER — Encounter: Payer: Self-pay | Admitting: Family Medicine

## 2010-06-16 ENCOUNTER — Other Ambulatory Visit: Payer: Self-pay | Admitting: Family Medicine

## 2010-06-16 DIAGNOSIS — Z9089 Acquired absence of other organs: Secondary | ICD-10-CM | POA: Insufficient documentation

## 2010-06-16 DIAGNOSIS — K635 Polyp of colon: Secondary | ICD-10-CM | POA: Insufficient documentation

## 2010-06-16 DIAGNOSIS — I83819 Varicose veins of unspecified lower extremities with pain: Secondary | ICD-10-CM

## 2010-06-16 DIAGNOSIS — E669 Obesity, unspecified: Secondary | ICD-10-CM | POA: Insufficient documentation

## 2010-06-16 DIAGNOSIS — E559 Vitamin D deficiency, unspecified: Secondary | ICD-10-CM | POA: Insufficient documentation

## 2010-06-16 DIAGNOSIS — I83813 Varicose veins of bilateral lower extremities with pain: Secondary | ICD-10-CM | POA: Insufficient documentation

## 2010-06-16 NOTE — Assessment & Plan Note (Signed)
Follows with endocrinology and is maintained on Synthroid and Cytomel, no changes today

## 2010-06-16 NOTE — Assessment & Plan Note (Signed)
Is currently taking a calcium with Vitamin D supplement will need levels monitored in the future

## 2010-06-16 NOTE — Assessment & Plan Note (Signed)
Unclear if this diagnosis is fully established, will encourage avoid simple carbs and check hgba1c with labs

## 2010-06-16 NOTE — Assessment & Plan Note (Signed)
No recent flares 

## 2010-06-16 NOTE — Assessment & Plan Note (Signed)
Patient is in need of next Prolia injection on 06/27/2010. Will arrange

## 2010-06-16 NOTE — Progress Notes (Signed)
Subjective:    Patient ID: Lori Durham, female    DOB: 1950-12-01, 60 y.o.   MRN: [id number]  HPI  Patient is a 60 yo Caucasian female in today for new patient appt.  She has a history of osteoporosis recently diagnosed on Prolia injections and is due for her next one 06/27/10. She has tolerated them well.  Her biggest concern today is of painful swollen varicose veins. She has undergone a chemical ablation veins in the past but now they have recurred. Most painful lesions are along the medial aspect of her leg just below her knee. She also has an uncomfortable lesions at top on anterior right leg.  She has not tried hot compresses or treatments thus far she notes she has tried compression hose in the distant past and did not tolerate them.  No recent illness, fevers, chills, chest pain, palpitations, shortness of breath, GI or GU complaints. She did undergo bariatric surgery last year.  Past Medical History  Diagnosis Date  . Weight gain   . Hypertension   . Osteoporosis 10-2008    last DEXA  . Hyperlipidemia   . Low back pain     Dr Jeral Fruit  . Varicose veins of both lower extremities with pain   . Asthma     mild, intermittent  . Hypothyroidism     postsurgical- sees Dr Everardo All  . GERD (gastroesophageal reflux disease)   . Vitamin D deficiency   . Obesity   . Colon polyps   . S/P tonsillectomy and adenoidectomy     Past Surgical History  Procedure Date  . 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 Abdominal hysterectomy     Partial w/ ovaries intact  . 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 Venoablation     Family History  Problem Relation Age of Onset  . Hypothyroidism Mother   . Heart disease Mother   . Hypertension Mother   . Alcohol abuse Father   . Arthritis Other     family hx of  . Coronary artery disease Other     family history of  . Hyperlipidemia Other     family history of  . Hypertension Other    family history of  . Heart disease Maternal Grandmother   . Alcohol abuse Paternal Grandfather   . Alcohol abuse Brother   . Heart disease Brother   . Heart attack Brother 42  . Alcohol abuse Brother   . Osteoporosis Brother   . Other Brother     varicose veins  . Osteoporosis Brother     History   Social History  . Marital Status: Married    Spouse Name: N/A    Number of Children: N/A  . Years of Education: N/A   Occupational History  . Not on file.   Social History Main Topics  . Smoking status: Never Smoker   . Smokeless tobacco: Never Used  . Alcohol Use: Yes     wine once a week  . Drug Use: No  . Sexually Active: Yes -- Female partner(s)   Other Topics Concern  . Not on file   Social History Narrative  . No narrative on file    Current Outpatient Prescriptions on File Prior to Visit  Medication Sig Dispense Refill  . Calcium Carbonate-Vitamin D (CALCIUM-D PO) Take by mouth daily.        Marland Kitchen denosumab (PROLIA)  60 MG/ML SOLN Inject 60 mg into the skin every 6 (six) months.        . Glucosamine-Chondroitin 1500-1200 MG/30ML LIQD Take by mouth daily.        Marland Kitchen levothyroxine (SYNTHROID, LEVOTHROID) 112 MCG tablet Take 112 mcg by mouth daily.        Marland Kitchen liothyronine (CYTOMEL) 5 MCG tablet Take 5 mcg by mouth daily.        . Multiple Vitamin-Folic Acid TABS Take 1 tablet by mouth daily.          No Known Allergies   Review of Systems  Constitutional: Negative for fever, chills, activity change and appetite change.  HENT: Negative for ear pain, nosebleeds, congestion, rhinorrhea, sneezing, neck pain, neck stiffness, postnasal drip and tinnitus.   Eyes: Negative for pain, discharge, itching and visual disturbance.  Respiratory: Negative for apnea, cough, choking, chest tightness, shortness of breath and wheezing.   Cardiovascular: Negative for chest pain, palpitations and leg swelling.       [Painful varicosity medial aspect of LLE near near, swollen and  tender Gastrointestinal: Negative.   Genitourinary: Negative.   Musculoskeletal: Negative.   Skin: Negative.   Neurological: Positive for numbness. Negative for dizziness, tremors, syncope, weakness, light-headedness and headaches.       [Numbness is in left lower extremity Psychiatric/Behavioral: Negative.        Objective:   Physical Exam  Constitutional: She is oriented to person, place, and time. She appears well-developed and well-nourished. No distress.  HENT:  Head: Normocephalic and atraumatic.  Right Ear: External ear normal.  Left Ear: External ear normal.  Nose: Nose normal.  Mouth/Throat: Oropharynx is clear and moist.  Eyes: Conjunctivae and EOM are normal. Pupils are equal, round, and reactive to light. Right eye exhibits no discharge. Left eye exhibits no discharge.  Neck: Normal range of motion. Neck supple. No thyromegaly present.  Cardiovascular: Normal rate, regular rhythm and normal heart sounds.  Exam reveals no gallop.   No murmur heard. Pulmonary/Chest: Effort normal and breath sounds normal. No respiratory distress. She has no wheezes. She exhibits no tenderness.  Abdominal: Soft. Bowel sounds are normal. She exhibits no distension and no mass. There is no tenderness. There is no rebound and no guarding.  Musculoskeletal: Normal range of motion. She exhibits no edema and no tenderness.  Lymphadenopathy:    She has no cervical adenopathy.  Neurological: She is alert and oriented to person, place, and time. She displays normal reflexes. No cranial nerve deficit. Coordination normal.  Skin: Skin is warm and dry. No rash noted. She is not diaphoretic.     Psychiatric: She has a normal mood and affect. Her behavior is normal. Judgment and thought content normal.          Assessment & Plan:  Vitamin D deficiency Is currently taking a calcium with Vitamin D supplement will need levels monitored in the future  Varicose veins of both lower extremities with  pain Patient noting increasing size and discomfort of veins in lower extremities, worse in left leg than right. Encouraged compression stockings 15-25 mmhg on in am off in pm and minimize sodium until seen by specialist. Will refer to [facility] for further treatments. If she develops warth and redness over veins in meantime will apply warm compresses 2-3 x a day  OSTEOPOROSIS Patient is in need of next Prolia injection on 06/27/2010. Will arrange  HYPOTHYROIDISM, POSTSURGICAL Follows with endocrinology and is maintained on Synthroid and Cytomel,  no changes today  ASTHMA No recent flares  DIABETES MELLITUS, TYPE II Unclear if this diagnosis is fully established, will encourage avoid simple carbs and check hgba1c with labs

## 2010-06-16 NOTE — Assessment & Plan Note (Signed)
Patient noting increasing size and discomfort of veins in lower extremities, worse in left leg than right. Encouraged compression stockings 15-25 mmhg on in am off in pm and minimize sodium until seen by specialist. Will refer to [facility] for further treatments. If she develops warth and redness over veins in meantime will apply warm compresses 2-3 x a day

## 2010-06-17 NOTE — Telephone Encounter (Signed)
Addended by: Danise Edge on: 06/17/2010 04:58 PM   Modules accepted: Orders

## 2010-06-17 NOTE — Telephone Encounter (Signed)
Patient aware referral is being made, discussed at visit

## 2010-06-19 ENCOUNTER — Other Ambulatory Visit: Payer: Self-pay | Admitting: Family Medicine

## 2010-06-19 ENCOUNTER — Ambulatory Visit: Payer: BC Managed Care – PPO

## 2010-06-19 ENCOUNTER — Encounter: Payer: Self-pay | Admitting: Family Medicine

## 2010-06-19 ENCOUNTER — Ambulatory Visit: Payer: Self-pay

## 2010-06-19 LAB — VITAMIN B12: Vitamin B-12: 1296 pg/mL — ABNORMAL HIGH (ref 211–911)

## 2010-06-19 LAB — HEPATIC FUNCTION PANEL
ALT: 27 U/L (ref 0–35)
AST: 22 U/L (ref 0–37)
Albumin: 3.9 g/dL (ref 3.5–5.2)
Total Bilirubin: 0.9 mg/dL (ref 0.3–1.2)
Total Protein: 6.3 g/dL (ref 6.0–8.3)

## 2010-06-19 LAB — HEMOGLOBIN A1C: Hgb A1c MFr Bld: 5.6 % (ref 4.6–6.5)

## 2010-06-19 LAB — BASIC METABOLIC PANEL
BUN: 20 mg/dL (ref 6–23)
Chloride: 108 mEq/L (ref 96–112)
Creatinine, Ser: 0.8 mg/dL (ref 0.4–1.2)
Glucose, Bld: 87 mg/dL (ref 70–99)
Potassium: 4.3 mEq/L (ref 3.5–5.1)

## 2010-06-19 LAB — FERRITIN: Ferritin: 220 ng/mL (ref 10.0–291.0)

## 2010-06-19 LAB — LIPID PANEL
Cholesterol: 204 mg/dL — ABNORMAL HIGH (ref 0–200)
VLDL: 31.2 mg/dL (ref 0.0–40.0)

## 2010-06-19 NOTE — Progress Notes (Signed)
Addended by: Baldemar Lenis on: 06/19/2010 09:23 AM   Modules accepted: Orders

## 2010-06-20 ENCOUNTER — Other Ambulatory Visit: Payer: Self-pay

## 2010-06-20 LAB — CBC
MCH: 33.8 pg (ref 26.0–34.0)
MCV: 104.1 fL — ABNORMAL HIGH (ref 78.0–100.0)
Platelets: 239 10*3/uL (ref 150–400)
RBC: 4.35 MIL/uL (ref 3.87–5.11)
RDW: 12.6 % (ref 11.5–15.5)
WBC: 5.2 10*3/uL (ref 4.0–10.5)

## 2010-06-20 LAB — PTH, INTACT AND CALCIUM
Calcium, Total (PTH): 9.8 mg/dL (ref 8.4–10.5)
PTH: 29.9 pg/mL (ref 14.0–72.0)

## 2010-06-20 MED ORDER — LIOTHYRONINE SODIUM 5 MCG PO TABS: 5.0000 ug | ORAL_TABLET | Freq: Every day | ORAL | Status: DC

## 2010-06-20 MED ORDER — LEVOTHYROXINE SODIUM 112 MCG PO TABS: 100.0000 ug | ORAL_TABLET | Freq: Every day | ORAL | Status: DC

## 2010-06-20 NOTE — Telephone Encounter (Signed)
Pt informed and RX sent, Cytomel also sent per pts request

## 2010-06-23 ENCOUNTER — Other Ambulatory Visit: Payer: Self-pay

## 2010-06-23 LAB — VITAMIN B1: Vitamin B1 (Thiamine): 79 nmol/L — ABNORMAL HIGH (ref 9–44)

## 2010-06-23 MED ORDER — LEVOTHYROXINE SODIUM 100 MCG PO TABS: 100.0000 ug | ORAL_TABLET | Freq: Every day | ORAL | Status: DC

## 2010-06-25 ENCOUNTER — Telehealth: Payer: Self-pay | Admitting: Family Medicine

## 2010-06-25 NOTE — Telephone Encounter (Signed)
Will inform with other note 

## 2010-06-25 NOTE — Telephone Encounter (Signed)
Notify normal

## 2010-06-25 NOTE — Telephone Encounter (Signed)
Wanting to know the results of her vit. D level please call. When all her lab results come in she would like a copy of her labs sent to her by mail.

## 2010-06-25 NOTE — Telephone Encounter (Signed)
Pt called and is req to change back to Dr. Clent Ridges. Pls advise.

## 2010-06-26 NOTE — Telephone Encounter (Signed)
That would be okay with me.

## 2010-06-26 NOTE — Telephone Encounter (Signed)
Pt has req to change pcps back to Dr Clent Ridges and Dr Clent Ridges has agreed to pts req. Pls advise if ok.

## 2010-06-26 NOTE — Telephone Encounter (Signed)
Opened in error

## 2010-06-27 ENCOUNTER — Ambulatory Visit: Payer: Self-pay

## 2010-06-28 NOTE — Telephone Encounter (Signed)
OK with me.

## 2010-07-03 ENCOUNTER — Ambulatory Visit: Payer: BC Managed Care – PPO

## 2010-07-04 ENCOUNTER — Encounter: Payer: Self-pay | Admitting: Family Medicine

## 2010-07-04 ENCOUNTER — Telehealth: Payer: Self-pay | Admitting: Family Medicine

## 2010-07-04 NOTE — Telephone Encounter (Signed)
Message sent in error

## 2010-07-04 NOTE — Progress Notes (Signed)
Made in error

## 2010-07-04 NOTE — Progress Notes (Signed)
Message in error °

## 2010-07-07 ENCOUNTER — Ambulatory Visit: Payer: Self-pay | Admitting: Family Medicine

## 2010-07-11 ENCOUNTER — Encounter: Payer: Self-pay | Admitting: Family Medicine

## 2010-07-11 ENCOUNTER — Ambulatory Visit (INDEPENDENT_AMBULATORY_CARE_PROVIDER_SITE_OTHER): Payer: BC Managed Care – PPO | Admitting: Family Medicine

## 2010-07-11 DIAGNOSIS — I8390 Asymptomatic varicose veins of unspecified lower extremity: Secondary | ICD-10-CM

## 2010-07-11 DIAGNOSIS — M810 Age-related osteoporosis without current pathological fracture: Secondary | ICD-10-CM

## 2010-07-11 DIAGNOSIS — E039 Hypothyroidism, unspecified: Secondary | ICD-10-CM

## 2010-07-11 MED ORDER — DENOSUMAB 60 MG/ML ~~LOC~~ SOLN
60.0000 mg | Freq: Once | SUBCUTANEOUS | Status: AC
  Administered 2010-07-11: 60 mg via SUBCUTANEOUS

## 2010-07-11 NOTE — Progress Notes (Signed)
  Subjective:    Patient ID: Lori Durham, female    DOB: [birth year], 60 y.o.   MRN: [id number]  HPI Here to re-establish with [place] after seeing Dr. Abner Greenspan once last month. She had a host of labs drawn which were remarkable for a suppressed TSH. Her Synthroid dose was decreased, and we plan to recheck a level 2 months later. She feels well in general except for very painful varicose veins. These are no better despite her recent weight loss and despite a trial of compression hose. She is set to see the [facility] soon.    Review of Systems  Constitutional: Negative.   Respiratory: Negative.   Cardiovascular: Negative.        Objective:   Physical Exam  Constitutional: She appears well-developed and well-nourished.  Neck: No thyromegaly present.  Cardiovascular: Normal rate.   Pulmonary/Chest: Effort normal.  Musculoskeletal:       Multiple tender swollen varicose veins in both lower legs           Assessment & Plan:  We will assume care of her again, and we will check another TSH in one month. See [facility] as above.

## 2010-08-05 NOTE — Letter (Signed)
October 15, 2009     RE:  Lori Durham  MRN:  [id number]  /  DOB:  11/04/1950   To Whom It May Concern:   This letter is concerning a patient of mine by the name of Lori Durham.  This patient is currently scheduled for a bariatric procedure  called a sleeve gastrectomy to be performed on November 13, 2009, at the  [facility] in [place], [place] by a Careers adviser  named Dr. Adolphus Birchwood.  Bariatric Surgery is quite appropriate and in fact  medically necessary in my opinion for this patient's long term health.  She has problems including hypertension, high cholesterol, severe  arthritis in the hip, spine, and knees, hypothyroidism, and type 2  diabetes mellitus.  All these problems are greatly exacerbated by her  morbid obesity.  She has attempted to lose weight by conventional  methods for number of years with very little success.  She has had  intense exercise program supervised by personal trainers which have  failed.  She has undergone rigorous dietary programs supervised by  nutritionists which have failed.  She has also been prescribed appetite  suppressants which have also failed.  I feel that she is medically  stable and is certainly safe to undergo such a procedure.   In summary, my patient Lori Durham is scheduled to undergo a sleeve  gastrectomy to treat morbid obesity, and I feel this is medically  necessary to prevent severe complications of the multiple medical  problems she has that are listed above.  If I may be of further  assistance, please let me know.    Sincerely,     Tera Mater. Clent Ridges, MD  Electronically Signed   SAF/MedQ  DD: 10/15/2009  DT: 10/15/2009  Job #: [id number]

## 2010-08-14 ENCOUNTER — Other Ambulatory Visit (INDEPENDENT_AMBULATORY_CARE_PROVIDER_SITE_OTHER): Payer: BC Managed Care – PPO

## 2010-08-14 DIAGNOSIS — E039 Hypothyroidism, unspecified: Secondary | ICD-10-CM

## 2010-08-14 LAB — TSH: TSH: 0.5 u[IU]/mL (ref 0.35–5.50)

## 2010-08-15 ENCOUNTER — Telehealth: Payer: Self-pay | Admitting: *Deleted

## 2010-08-15 ENCOUNTER — Other Ambulatory Visit: Payer: Self-pay | Admitting: *Deleted

## 2010-08-15 MED ORDER — LEVOTHYROXINE SODIUM 100 MCG PO TABS: 100.0000 ug | ORAL_TABLET | Freq: Every day | ORAL | Status: DC

## 2010-08-15 MED ORDER — LIOTHYRONINE SODIUM 5 MCG PO TABS: 5.0000 ug | ORAL_TABLET | Freq: Every day | ORAL | Status: DC

## 2010-08-15 NOTE — Telephone Encounter (Signed)
See the report. 

## 2010-08-15 NOTE — Telephone Encounter (Signed)
Would like lab results today, please.  Call her work not home.

## 2010-09-01 ENCOUNTER — Encounter: Payer: Self-pay | Admitting: Family Medicine

## 2010-10-10 ENCOUNTER — Other Ambulatory Visit: Payer: Self-pay | Admitting: Family Medicine

## 2010-11-06 ENCOUNTER — Ambulatory Visit (INDEPENDENT_AMBULATORY_CARE_PROVIDER_SITE_OTHER): Payer: BC Managed Care – PPO | Admitting: Family Medicine

## 2010-11-06 ENCOUNTER — Encounter: Payer: Self-pay | Admitting: Family Medicine

## 2010-11-06 VITALS — BP 120/80 | HR 68 | Temp 98.7°F | Wt 209.0 lb

## 2010-11-06 DIAGNOSIS — M810 Age-related osteoporosis without current pathological fracture: Secondary | ICD-10-CM

## 2010-11-06 DIAGNOSIS — R739 Hyperglycemia, unspecified: Secondary | ICD-10-CM

## 2010-11-06 DIAGNOSIS — R7309 Other abnormal glucose: Secondary | ICD-10-CM

## 2010-11-06 DIAGNOSIS — E039 Hypothyroidism, unspecified: Secondary | ICD-10-CM

## 2010-11-06 DIAGNOSIS — E669 Obesity, unspecified: Secondary | ICD-10-CM

## 2010-11-06 LAB — BASIC METABOLIC PANEL
CO2: 30 mEq/L (ref 19–32)
Calcium: 9.2 mg/dL (ref 8.4–10.5)
Chloride: 101 mEq/L (ref 96–112)
Glucose, Bld: 88 mg/dL (ref 70–99)
Sodium: 139 mEq/L (ref 135–145)

## 2010-11-06 LAB — T3, FREE: T3, Free: 2.7 pg/mL (ref 2.3–4.2)

## 2010-11-06 LAB — POCT URINALYSIS DIPSTICK
Blood, UA: NEGATIVE
Nitrite, UA: NEGATIVE
Protein, UA: NEGATIVE
Spec Grav, UA: 1.01
Urobilinogen, UA: 0.2
pH, UA: 7

## 2010-11-06 LAB — LIPID PANEL
Total CHOL/HDL Ratio: 4
Triglycerides: 113 mg/dL (ref 0.0–149.0)

## 2010-11-06 LAB — CBC WITH DIFFERENTIAL/PLATELET
Basophils Relative: 0.5 % (ref 0.0–3.0)
Eosinophils Relative: 2.5 % (ref 0.0–5.0)
HCT: 44 % (ref 36.0–46.0)
Lymphs Abs: 1.9 10*3/uL (ref 0.7–4.0)
MCV: 103.8 fl — ABNORMAL HIGH (ref 78.0–100.0)
Monocytes Absolute: 0.3 10*3/uL (ref 0.1–1.0)
Monocytes Relative: 6.3 % (ref 3.0–12.0)
Platelets: 216 10*3/uL (ref 150.0–400.0)
RBC: 4.24 Mil/uL (ref 3.87–5.11)
WBC: 5.1 10*3/uL (ref 4.5–10.5)

## 2010-11-06 LAB — LDL CHOLESTEROL, DIRECT: Direct LDL: 144.2 mg/dL

## 2010-11-06 LAB — HEPATIC FUNCTION PANEL
ALT: 24 U/L (ref 0–35)
AST: 24 U/L (ref 0–37)
Albumin: 4.4 g/dL (ref 3.5–5.2)
Alkaline Phosphatase: 58 U/L (ref 39–117)
Bilirubin, Direct: 0 mg/dL (ref 0.0–0.3)
Total Protein: 7.2 g/dL (ref 6.0–8.3)

## 2010-11-06 LAB — HEMOGLOBIN A1C: Hgb A1c MFr Bld: 5.4 % (ref 4.6–6.5)

## 2010-11-06 LAB — VITAMIN B12: Vitamin B-12: 944 pg/mL — ABNORMAL HIGH (ref 211–911)

## 2010-11-06 NOTE — Progress Notes (Signed)
  Subjective:    Patient ID: Lori Durham, female    DOB: August 21, 1950, 60 y.o.   MRN: [id number]  HPI Here to get fasting labs and to ask advice. She is eating between 1000 to 1200 calories a day and exercsing daily, but her weight loss is still very slow. She is 6 lbs lighter than at her last visit here. She feels well.    Review of Systems  Constitutional: Negative.   Respiratory: Negative.   Cardiovascular: Negative.        Objective:   Physical Exam  Constitutional: She appears well-developed and well-nourished.  Neck: No thyromegaly present.  Cardiovascular: Normal rate, regular rhythm, normal heart sounds and intact distal pulses.   Pulmonary/Chest: Effort normal and breath sounds normal.  Lymphadenopathy:    She has no cervical adenopathy.          Assessment & Plan:  We will get a full panel of labs today

## 2010-11-10 ENCOUNTER — Telehealth: Payer: Self-pay | Admitting: *Deleted

## 2010-11-10 NOTE — Telephone Encounter (Signed)
Pt called again re: labs.

## 2010-11-10 NOTE — Telephone Encounter (Signed)
Please call results of lab work.

## 2010-11-11 ENCOUNTER — Telehealth: Payer: Self-pay

## 2010-11-11 NOTE — Telephone Encounter (Signed)
See report

## 2010-11-11 NOTE — Telephone Encounter (Signed)
Message copied by Beverely Low on Tue Nov 11, 2010  2:10 PM ------      Message from: Gershon Crane A      Created: Tue Nov 11, 2010  5:35 AM       Normal except for mildly high chol. Watch the diet. Her thyroid levels were all normal

## 2010-11-11 NOTE — Telephone Encounter (Signed)
Pt was given results and copy up front for -pick up

## 2010-11-17 ENCOUNTER — Telehealth: Payer: Self-pay | Admitting: *Deleted

## 2010-11-17 NOTE — Telephone Encounter (Signed)
Left voice message with below info. 

## 2010-11-17 NOTE — Telephone Encounter (Signed)
Pt saw hyperglycemia on her records and wonders if she should be concerned about this.

## 2010-11-17 NOTE — Telephone Encounter (Signed)
This means her glucose is elevated above normal but not to the point we call it diabetes. She needs to exercise and restrict her carbohydrate intake to keep this from becoming overt diabetes

## 2010-11-18 ENCOUNTER — Telehealth: Payer: Self-pay | Admitting: Family Medicine

## 2010-11-18 NOTE — Telephone Encounter (Signed)
Wants a new rxs of Acyclovir ointment and pills for her fever blisters. [facility]thanks.

## 2010-11-18 NOTE — Telephone Encounter (Signed)
Call in Acyclovir ointment, to apply 4 times a day prn, 30 days supply with 11 rf. Also Acyclovir 400 mg tabs, to take 4 times a day prn, #60 with 11 rf

## 2010-11-19 MED ORDER — ACYCLOVIR 400 MG PO TABS: 400.0000 mg | ORAL_TABLET | Freq: Four times a day (QID) | ORAL | Status: AC | PRN

## 2010-11-19 MED ORDER — ACYCLOVIR 5 % EX OINT: TOPICAL_OINTMENT | CUTANEOUS | Status: AC

## 2010-11-19 NOTE — Telephone Encounter (Signed)
Both scripts called in

## 2010-11-27 ENCOUNTER — Telehealth: Payer: Self-pay | Admitting: Family Medicine

## 2010-11-27 NOTE — Telephone Encounter (Signed)
Pt is requesting her Bone Density results.

## 2010-11-28 ENCOUNTER — Telehealth: Payer: Self-pay | Admitting: Family Medicine

## 2010-11-28 NOTE — Telephone Encounter (Signed)
Pt wants to know what does normal mean for her ( Bone Density Scan )? Please call back and not to her cell phone number.

## 2010-11-28 NOTE — Telephone Encounter (Signed)
Left voice message- scan was normal and should repeat in 2 years.

## 2010-12-04 ENCOUNTER — Encounter: Payer: Self-pay | Admitting: Family Medicine

## 2010-12-04 NOTE — Telephone Encounter (Signed)
Please advise 

## 2010-12-04 NOTE — Telephone Encounter (Signed)
I called home number and left a detailed message with below info.

## 2010-12-04 NOTE — Telephone Encounter (Signed)
Her DEXA results were normal. This means that she is at average risk of osteoporotic fractures compared to other normal women her age. She should take calcium 1500 and vitamin D 2000 every day, but she does not need any prescribed meds.

## 2010-12-23 ENCOUNTER — Telehealth: Payer: Self-pay | Admitting: *Deleted

## 2010-12-23 NOTE — Telephone Encounter (Signed)
I would advise using Prolia for a total of 5 years and then stopping it

## 2010-12-23 NOTE — Telephone Encounter (Signed)
Pt is asking if she still needs Prolia 12/2010 since her bone density was better??

## 2010-12-24 NOTE — Telephone Encounter (Signed)
Spoke with pt

## 2010-12-24 NOTE — Telephone Encounter (Signed)
Left a message for pt to return call 

## 2010-12-26 ENCOUNTER — Telehealth: Payer: Self-pay | Admitting: Family Medicine

## 2010-12-26 NOTE — Telephone Encounter (Signed)
Pt spoke to you a few days ago about a Prolia injection. She was informed to contact her insurance about getting authorization for this injection. Pt has contact insurance and was instructed to have the doctor contact them or fax them. Any questions please contact pt     Fax [phone]

## 2010-12-30 NOTE — Telephone Encounter (Signed)
I see from her chart that her last DEXA was in August of 2010, and we should be getting these every 2 years. Her insurance company will probably require this before it would approve the Prolia anyway. Please set her up for another DEXA soon for osteoporosis.

## 2011-01-08 ENCOUNTER — Telehealth: Payer: Self-pay | Admitting: Family Medicine

## 2011-01-08 NOTE — Telephone Encounter (Signed)
I gave paperwork to [facility] to help with scheduling the Prolia and also the EKG.

## 2011-01-08 NOTE — Telephone Encounter (Signed)
Pt called to get status on getting Prolia Inj and EKG. Pt originally req this back on 12/26/10. Pls call asap.

## 2011-01-08 NOTE — Telephone Encounter (Signed)
Pt stated last dexa was aug 2012 at [facility]

## 2011-01-15 NOTE — Telephone Encounter (Signed)
Spoke with patient, appt scheduled

## 2011-01-23 ENCOUNTER — Ambulatory Visit: Payer: BC Managed Care – PPO | Admitting: Family Medicine

## 2011-01-27 ENCOUNTER — Telehealth: Payer: Self-pay

## 2011-01-27 NOTE — Telephone Encounter (Signed)
Took triage call- pt is coming in Thurs for prolia injection. Has emergency surg for gallbladder removal - was on abx in hospital - now with vaginal itching - ?yeast infection? Do you need to see or can something be called out?  I did tell her that if we called out rx - you would like to see her post hospital after surgeon released her. [facility] - please advise

## 2011-01-28 MED ORDER — TERCONAZOLE 0.4 % VA CREA: 1.0000 | TOPICAL_CREAM | Freq: Two times a day (BID) | VAGINAL | Status: DC

## 2011-01-28 NOTE — Telephone Encounter (Signed)
rx sent into pharmacy

## 2011-01-28 NOTE — Telephone Encounter (Signed)
Call in Terazol vaginal cream, apply bid prn, 10 day supply with 2 rf

## 2011-01-29 ENCOUNTER — Ambulatory Visit (INDEPENDENT_AMBULATORY_CARE_PROVIDER_SITE_OTHER): Payer: BC Managed Care – PPO | Admitting: Family Medicine

## 2011-01-29 ENCOUNTER — Telehealth: Payer: Self-pay | Admitting: Family Medicine

## 2011-01-29 ENCOUNTER — Other Ambulatory Visit: Payer: Self-pay

## 2011-01-29 DIAGNOSIS — M810 Age-related osteoporosis without current pathological fracture: Secondary | ICD-10-CM

## 2011-01-29 MED ORDER — DENOSUMAB 60 MG/ML ~~LOC~~ SOLN
60.0000 mg | Freq: Once | SUBCUTANEOUS | Status: AC
  Administered 2011-01-29: 60 mg via SUBCUTANEOUS

## 2011-01-29 MED ORDER — FLUCONAZOLE 150 MG PO TABS: 150.0000 mg | ORAL_TABLET | Freq: Once | ORAL | Status: AC

## 2011-01-29 NOTE — Telephone Encounter (Signed)
Pt came in for a Prolia injection today and request a pill instead of vaginal cream.  Per Dr Clent Ridges sent in Diflucan to pharmacy.

## 2011-01-29 NOTE — Telephone Encounter (Signed)
Triage Record Num: [id number] Operator: Tarri Glenn Patient Name: Lindalou Soltis Call Date & Time: 01/27/2011 6:45:42PM Patient Phone: [phone] ext. [phone] PCP: Tera Mater. Clent Ridges Patient Gender: Female PCP Fax : [phone] Patient DOB: 03/28/1950 Practice Name: [name] Reason for Call: Patient is calling about called office 01/27/11 and was told that Diflucan was going to be called into pharmacy but it is not there yet. All emergent s/s r/o with exception to genital itching, burning or redness per Vaginal Discharge or Irritation protocol. Prescription for Diflucan 150mg  x1, no refills, [facility] [phone] to Cindy, Rph, per standing orders. Protocol(s) Used: Vaginal Discharge or Irritation Recommended Outcome per Protocol: See Provider within 24 hours Override Outcome if Used in Protocol: Provide Home/Self Care RN Reason for Override Outcome: Rx Standing Orders Used. Reason for Outcome: Genital itching, burning or redness Care Advice: ~

## 2011-01-30 ENCOUNTER — Encounter: Payer: Self-pay | Admitting: Internal Medicine

## 2011-01-30 ENCOUNTER — Ambulatory Visit (INDEPENDENT_AMBULATORY_CARE_PROVIDER_SITE_OTHER): Payer: BC Managed Care – PPO | Admitting: Internal Medicine

## 2011-01-30 ENCOUNTER — Other Ambulatory Visit (HOSPITAL_COMMUNITY)
Admission: RE | Admit: 2011-01-30 | Discharge: 2011-01-30 | Disposition: A | Discharge status: Home / Self Care | Payer: BC Managed Care – PPO | Source: Ambulatory Visit | Attending: Internal Medicine | Admitting: Internal Medicine

## 2011-01-30 VITALS — BP 110/60 | HR 72 | Wt 197.0 lb

## 2011-01-30 DIAGNOSIS — N760 Acute vaginitis: Secondary | ICD-10-CM

## 2011-01-30 DIAGNOSIS — Z9889 Other specified postprocedural states: Secondary | ICD-10-CM

## 2011-01-30 DIAGNOSIS — Z01419 Encounter for gynecological examination (general) (routine) without abnormal findings: Secondary | ICD-10-CM | POA: Insufficient documentation

## 2011-01-30 DIAGNOSIS — E669 Obesity, unspecified: Secondary | ICD-10-CM

## 2011-01-30 DIAGNOSIS — Z113 Encounter for screening for infections with a predominantly sexual mode of transmission: Secondary | ICD-10-CM | POA: Insufficient documentation

## 2011-01-30 LAB — POCT URINALYSIS DIPSTICK
Bilirubin, UA: NEGATIVE
Blood, UA: NEGATIVE
Ketones, UA: NEGATIVE
pH, UA: 5

## 2011-01-30 NOTE — Patient Instructions (Signed)
Will notify you  of labs when available. This could be prtly treated yeast .  Take/finish the terazole  In the meantime .

## 2011-01-30 NOTE — Progress Notes (Signed)
  Subjective:    Patient ID: Lori Durham, female    DOB: 03-23-1951, 60 y.o.   MRN: [id number]  HPI Patient comes in today for SDA  For acute problem evaluation.  Pt of dr fry. Says se has severe problem with vaginal ? Yeast infection.   Dr Lysle Morales had called in diflucan and terazole    Had hospital for gallbladder surgery.   And mild secondary  pancreatitis .   Got out November 3rd.  Had antibiotic on the hospital. No meds on dc . Then began to have sx.   A day or 2 later   External irritation. Severe this week  Did have dc now  is better.  No frequency but dysuria and sensitivity externally  Used 2 day of terazole and didn't finish cause she had the diflucan. Still with sx and going to weekend and asks to be seen today.   Want to make sure she doesn't have any thing like a staph infection.  Low risk sti  Hx of hyst for endometriosis no hx of gu cancer   Review of Systems NO fever cp sob  New gi gu sx. No vomiting uti sx otherwise.  No hx of foley catheter use.  Rest of ros neg or [place] Past history family history social history reviewed in the electronic medical record.     Objective:   Physical Exam  WDWN in nad  Neck: Supple without adenopathy or masses or bruits BP 110/60  Pulse 72  Wt 197 lb (89.359 kg) abd soft nt healng stapled area from lap surgery. No masses or hernia seen  [id number]  Mild  erythema no lesions or pustules  Speculum exam  Done no cx  red no lesion vaginally scan dc.  No hosp records fromHP hospital  See ua  Affirm sent     Assessment & Plan:  Vaginitis  Post surgical hosp and antibiotic  No hx of same ( bg nl in hospital and says she doesn't have dm)  Low to no risk sti  Partly rx ?  Exam not as bad as her sx .  Good hx of yeast.rec finish the terazole  In case of resistant yeast . Affirm done today. Should fu with dr Clent Ridges for further advice if not better  ? If has vaginal atrophic changes  SP abd surgery. SP bariatric surgery. Pt say she doesn't  have dm  Will change in ehr .  To rseolve and make comment of such    Total visit > 50% spent counseling revieweing record  Updating with pt  and coordinating care

## 2011-01-31 ENCOUNTER — Encounter: Payer: Self-pay | Admitting: Internal Medicine

## 2011-01-31 DIAGNOSIS — Z9889 Other specified postprocedural states: Secondary | ICD-10-CM | POA: Insufficient documentation

## 2011-01-31 DIAGNOSIS — N760 Acute vaginitis: Secondary | ICD-10-CM | POA: Insufficient documentation

## 2011-02-04 ENCOUNTER — Telehealth: Payer: Self-pay | Admitting: *Deleted

## 2011-02-04 NOTE — Telephone Encounter (Signed)
I cannot find results   In the EHR  Please  Investigate  ? If result  Not back yet or to anothers desk top?

## 2011-02-04 NOTE — Telephone Encounter (Signed)
These results need to come from Dr. Fabian Sharp, the doctor who ordered them

## 2011-02-04 NOTE — Telephone Encounter (Signed)
Pt would like to have lab results.

## 2011-02-04 NOTE — Telephone Encounter (Signed)
Pt. Is calling for lab results.

## 2011-02-05 ENCOUNTER — Telehealth: Payer: Self-pay | Admitting: *Deleted

## 2011-02-05 NOTE — Telephone Encounter (Signed)
See previous phone note.  

## 2011-02-05 NOTE — Telephone Encounter (Signed)
Pt aware that results are not back yet but that we will call her as soon as we get the results.

## 2011-02-05 NOTE — Telephone Encounter (Signed)
Returning call for lab work again.

## 2011-02-18 ENCOUNTER — Telehealth: Payer: Self-pay | Admitting: *Deleted

## 2011-02-18 NOTE — Progress Notes (Signed)
Quick Note:  Tell patient PAP is normal. As well as Neg for yeast and BV. ______

## 2011-02-18 NOTE — Telephone Encounter (Signed)
Patient is requesting her lab results

## 2011-02-19 NOTE — Progress Notes (Signed)
Quick Note:  Left message to call back. ______ 

## 2011-02-19 NOTE — Telephone Encounter (Signed)
Dr. Fabian Sharp saw her that day

## 2011-02-19 NOTE — Telephone Encounter (Signed)
See results. Left message for pt to call back.

## 2011-02-20 NOTE — Progress Notes (Signed)
Quick Note:  Pt aware of results. ______ 

## 2011-03-03 ENCOUNTER — Ambulatory Visit (INDEPENDENT_AMBULATORY_CARE_PROVIDER_SITE_OTHER): Payer: BC Managed Care – PPO | Admitting: Family Medicine

## 2011-03-03 ENCOUNTER — Encounter: Payer: Self-pay | Admitting: Family Medicine

## 2011-03-03 VITALS — BP 130/88 | HR 81 | Temp 99.4°F | Wt 190.0 lb

## 2011-03-03 DIAGNOSIS — J329 Chronic sinusitis, unspecified: Secondary | ICD-10-CM

## 2011-03-03 MED ORDER — HYDROCODONE-HOMATROPINE 5-1.5 MG/5ML PO SYRP: 5.0000 mL | ORAL_SOLUTION | ORAL | Status: AC | PRN

## 2011-03-03 MED ORDER — FLUCONAZOLE 150 MG PO TABS: 150.0000 mg | ORAL_TABLET | Freq: Once | ORAL | Status: AC

## 2011-03-03 MED ORDER — AMOXICILLIN-POT CLAVULANATE 875-125 MG PO TABS: 1.0000 | ORAL_TABLET | Freq: Two times a day (BID) | ORAL | Status: AC

## 2011-03-03 NOTE — Progress Notes (Signed)
  Subjective:    Patient ID: Lori Durham, female    DOB: Aug 18, 1950, 60 y.o.   MRN: [id number]  HPI Here for one week of sinus pressure, PND, right ear pain, ST, and a dry cough.    Review of Systems  Constitutional: Positive for fever.  HENT: Positive for ear pain, congestion, postnasal drip and sinus pressure.   Eyes: Negative.   Respiratory: Positive for cough.        Objective:   Physical Exam  Constitutional: She appears well-developed and well-nourished.  HENT:  Right Ear: External ear normal.  Left Ear: External ear normal.  Nose: Nose normal.  Mouth/Throat: Oropharynx is clear and moist. No oropharyngeal exudate.  Eyes: Conjunctivae are normal.  Pulmonary/Chest: Effort normal and breath sounds normal.  Lymphadenopathy:    She has no cervical adenopathy.          Assessment & Plan:  Recheck prn

## 2011-04-27 ENCOUNTER — Telehealth: Payer: Self-pay | Admitting: *Deleted

## 2011-04-27 MED ORDER — ALPRAZOLAM 0.25 MG PO TABS: 0.2500 mg | ORAL_TABLET | Freq: Three times a day (TID) | ORAL | Status: DC | PRN

## 2011-04-27 NOTE — Telephone Encounter (Signed)
Script called in to [facility] and I spoke with pt.

## 2011-04-27 NOTE — Telephone Encounter (Signed)
Pt is requesting low dose of generic xanax- pt is going thru separation and needs something for anxiety--[facility] [address]--call pt at work at [phone]

## 2011-04-27 NOTE — Telephone Encounter (Signed)
Call in Xanax 0.25 mg tid prn anxiety, #60 with no rf. See me after that

## 2011-04-28 ENCOUNTER — Telehealth: Payer: Self-pay | Admitting: Family Medicine

## 2011-04-28 MED ORDER — ALPRAZOLAM 0.25 MG PO TABS: 0.2500 mg | ORAL_TABLET | Freq: Three times a day (TID) | ORAL | Status: AC | PRN

## 2011-04-28 NOTE — Telephone Encounter (Signed)
Pt requesting script to be called in to [facility] on [address] ( for Xanax ). I cancelled script at [facility] and called in to [facility], also spoke with pt.

## 2011-06-12 ENCOUNTER — Other Ambulatory Visit: Payer: Self-pay | Admitting: Dermatology

## 2011-07-08 ENCOUNTER — Other Ambulatory Visit: Payer: Self-pay | Admitting: Family Medicine

## 2011-07-29 ENCOUNTER — Telehealth: Payer: Self-pay | Admitting: Family Medicine

## 2011-07-29 NOTE — Telephone Encounter (Signed)
Pt called and said that she is sch for Prolia inj on Fri 07/31/11 and wants to be sure that this has been preapproved by pts insurance. Pt said that Tim Lair was suppose to be working on getting approval. Pls call.

## 2011-07-30 NOTE — Telephone Encounter (Signed)
Prolia authorized by [employer]. Pt aware.

## 2011-07-31 ENCOUNTER — Ambulatory Visit (INDEPENDENT_AMBULATORY_CARE_PROVIDER_SITE_OTHER): Payer: BC Managed Care – PPO | Admitting: Family Medicine

## 2011-07-31 ENCOUNTER — Other Ambulatory Visit: Payer: BC Managed Care – PPO

## 2011-07-31 DIAGNOSIS — M810 Age-related osteoporosis without current pathological fracture: Secondary | ICD-10-CM

## 2011-07-31 MED ORDER — DENOSUMAB 60 MG/ML ~~LOC~~ SOLN
60.0000 mg | Freq: Once | SUBCUTANEOUS | Status: AC
  Administered 2011-07-31: 60 mg via SUBCUTANEOUS

## 2011-08-07 ENCOUNTER — Encounter: Payer: BC Managed Care – PPO | Admitting: Family Medicine

## 2011-10-05 ENCOUNTER — Telehealth: Payer: Self-pay | Admitting: Family Medicine

## 2011-10-05 MED ORDER — LIOTHYRONINE SODIUM 5 MCG PO TABS: 5.0000 ug | ORAL_TABLET | Freq: Every day | ORAL | Status: DC

## 2011-10-05 NOTE — Telephone Encounter (Signed)
Refill request for Cytomel and I sent script e-scribe.

## 2011-10-20 ENCOUNTER — Encounter: Payer: Self-pay | Admitting: Family Medicine

## 2011-12-07 HISTORY — PX: COLONOSCOPY: SHX174

## 2011-12-17 ENCOUNTER — Other Ambulatory Visit (INDEPENDENT_AMBULATORY_CARE_PROVIDER_SITE_OTHER): Payer: BC Managed Care – PPO

## 2011-12-17 DIAGNOSIS — Z0000 Encounter for general adult medical examination without abnormal findings: Secondary | ICD-10-CM

## 2011-12-17 LAB — LDL CHOLESTEROL, DIRECT: Direct LDL: 141.2 mg/dL

## 2011-12-17 LAB — POCT URINALYSIS DIPSTICK
Ketones, UA: NEGATIVE
Protein, UA: NEGATIVE
Urobilinogen, UA: 0.2
pH, UA: 5.5

## 2011-12-17 LAB — HEPATIC FUNCTION PANEL
ALT: 21 U/L (ref 0–35)
Alkaline Phosphatase: 58 U/L (ref 39–117)
Bilirubin, Direct: 0.1 mg/dL (ref 0.0–0.3)
Total Protein: 7.1 g/dL (ref 6.0–8.3)

## 2011-12-17 LAB — BASIC METABOLIC PANEL
Calcium: 9.2 mg/dL (ref 8.4–10.5)
Creatinine, Ser: 0.7 mg/dL (ref 0.4–1.2)
GFR: 94.14 mL/min (ref >60.00)

## 2011-12-17 LAB — CBC WITH DIFFERENTIAL/PLATELET
Basophils Relative: 0.6 % (ref 0.0–3.0)
Eosinophils Absolute: 0.2 10*3/uL (ref 0.0–0.7)
Eosinophils Relative: 3.6 % (ref 0.0–5.0)
Lymphocytes Relative: 47.9 % — ABNORMAL HIGH (ref 12.0–46.0)
Neutrophils Relative %: 40.1 % — ABNORMAL LOW (ref 43.0–77.0)
RBC: 4.11 Mil/uL (ref 3.87–5.11)
WBC: 4.3 10*3/uL — ABNORMAL LOW (ref 4.5–10.5)

## 2011-12-17 LAB — LIPID PANEL
Total CHOL/HDL Ratio: 4
VLDL: 22.6 mg/dL (ref 0.0–40.0)

## 2011-12-17 NOTE — Addendum Note (Signed)
Addended by: Bonnye Fava on: 12/17/2011 08:46 AM   Modules accepted: Orders

## 2011-12-21 NOTE — Progress Notes (Signed)
Quick Note:  I left voice message with results. ______ 

## 2011-12-25 ENCOUNTER — Encounter: Payer: Self-pay | Admitting: Family Medicine

## 2011-12-25 ENCOUNTER — Ambulatory Visit (INDEPENDENT_AMBULATORY_CARE_PROVIDER_SITE_OTHER): Payer: BC Managed Care – PPO | Admitting: Family Medicine

## 2011-12-25 VITALS — BP 132/84 | HR 66 | Temp 98.3°F | Ht 65.75 in | Wt 200.0 lb

## 2011-12-25 DIAGNOSIS — Z23 Encounter for immunization: Secondary | ICD-10-CM

## 2011-12-25 DIAGNOSIS — Z0000 Encounter for general adult medical examination without abnormal findings: Secondary | ICD-10-CM

## 2011-12-25 MED ORDER — SYNTHROID 100 MCG PO TABS: 100.0000 ug | ORAL_TABLET | Freq: Every day | ORAL | Status: DC

## 2011-12-25 MED ORDER — LIOTHYRONINE SODIUM 5 MCG PO TABS: 5.0000 ug | ORAL_TABLET | Freq: Every day | ORAL | Status: DC

## 2011-12-25 NOTE — Addendum Note (Signed)
Addended by: Aniceto Boss A on: 12/25/2011 10:28 AM   Modules accepted: Orders

## 2011-12-25 NOTE — Progress Notes (Signed)
  Subjective:    Patient ID: Lori Durham, female    DOB: [birth year], 61 y.o.   MRN: [id number]  HPI 61 yr old female for a cpx. She feels well and has no concerns. She is going through a divorce currently. She says things broke down with her husband after she had her bariatric surgery.    Review of Systems  Constitutional: Negative.   HENT: Negative.   Eyes: Negative.   Respiratory: Negative.   Cardiovascular: Negative.   Gastrointestinal: Negative.   Genitourinary: Negative for dysuria, urgency, frequency, hematuria, flank pain, decreased urine volume, enuresis, difficulty urinating, pelvic pain and dyspareunia.  Musculoskeletal: Negative.   Skin: Negative.   Neurological: Negative.   Hematological: Negative.   Psychiatric/Behavioral: Negative.        Objective:   Physical Exam  Constitutional: She is oriented to person, place, and time. She appears well-developed and well-nourished. No distress.  HENT:  Head: Normocephalic and atraumatic.  Right Ear: External ear normal.  Left Ear: External ear normal.  Nose: Nose normal.  Mouth/Throat: Oropharynx is clear and moist. No oropharyngeal exudate.  Eyes: Conjunctivae normal and EOM are normal. Pupils are equal, round, and reactive to light. No scleral icterus.  Neck: Normal range of motion. Neck supple. No JVD present. No thyromegaly present.  Cardiovascular: Normal rate, regular rhythm, normal heart sounds and intact distal pulses.  Exam reveals no gallop and no friction rub.   No murmur heard.      EKG normal   Pulmonary/Chest: Effort normal and breath sounds normal. No respiratory distress. She has no wheezes. She has no rales. She exhibits no tenderness.  Abdominal: Soft. Bowel sounds are normal. She exhibits no distension and no mass. There is no tenderness. There is no rebound and no guarding.  Musculoskeletal: Normal range of motion. She exhibits no edema and no tenderness.  Lymphadenopathy:    She has no cervical  adenopathy.  Neurological: She is alert and oriented to person, place, and time. She has normal reflexes. No cranial nerve deficit. She exhibits normal muscle tone. Coordination normal.  Skin: Skin is warm and dry. No rash noted. No erythema.  Psychiatric: She has a normal mood and affect. Her behavior is normal. Judgment and thought content normal.          Assessment & Plan:  Well exam. She needs to get back on exercising again.

## 2012-01-06 ENCOUNTER — Telehealth: Payer: Self-pay | Admitting: Family Medicine

## 2012-01-06 NOTE — Telephone Encounter (Signed)
Lori Durham,  Patient is scheduled for Prolia injection on 11/21. States she usually calls in 2wks ahead of time to make sure we have the injection. Is this necessary, or will it be here?

## 2012-01-06 NOTE — Telephone Encounter (Signed)
Yes we do have the injection in and I left a voice message with this information.

## 2012-01-20 ENCOUNTER — Other Ambulatory Visit: Payer: Self-pay | Admitting: Family Medicine

## 2012-02-05 ENCOUNTER — Ambulatory Visit (INDEPENDENT_AMBULATORY_CARE_PROVIDER_SITE_OTHER): Payer: BC Managed Care – PPO | Admitting: Family Medicine

## 2012-02-05 DIAGNOSIS — M810 Age-related osteoporosis without current pathological fracture: Secondary | ICD-10-CM

## 2012-02-05 MED ORDER — DENOSUMAB 60 MG/ML ~~LOC~~ SOLN
60.0000 mg | Freq: Once | SUBCUTANEOUS | Status: AC
  Administered 2012-02-05: 60 mg via SUBCUTANEOUS

## 2012-02-08 ENCOUNTER — Telehealth: Payer: Self-pay | Admitting: Family Medicine

## 2012-02-08 NOTE — Telephone Encounter (Signed)
[facility] Triage Call Report Triage Record Num: [id number] Operator: Bennie Hind Patient Name: Lori Durham Call Date & Time: 02/06/2012 10:47:04AM Patient Phone: [phone] ext. [phone] PCP: Tera Mater. Clent Ridges Patient Gender: Female PCP Fax : [phone] Patient DOB: 03/10/1951 Practice Name: [name] Reason for Call: Caller: Gregory/Patient; PCP: Gershon Crane [facility]); CB#: [phone]; Call regarding Back Pain; 02-06-12 had episdode of very sharp back pain last night, she took some Ibuprofen and it seemed to ease up her sxs. She has very mild pain now. She got her Prolia shot yesterday and she has never had any type of adverse reaction to it She has no neuro deficits. All emergent sxs per Back Symptoms ruled out Home care advice given Protocol(s) Used: Back Symptoms Recommended Outcome per Protocol: Provide Home/Self Care Reason for Outcome: Mild to moderate low back discomfort or recurrent backaches and no other symptoms AND responding to home care OR home care for less than 72 hours Care Advice: ~ Call provider if symptoms worsen or new symptoms develop. ~ Avoid heavy lifting, bending and twisting of the back, and prolonged sitting for 24 hours. Apply a cloth-covered cold or ice pack to the area for 20 minutes 4 to 8 times a day for relief of pain for the first 24-48 hours. After 24 to 48 hours of cold application, use a cloth-covered heat pack to the area for 20 minutes 3 to 4 times a day. ~ Continue normal activities. Walking short distances, using a stationary bike or swimming can be done even if having mild pain without putting much stress on back. Follow any provider's specific directions. ~ Sleep on a moderately firm mattress. Try sleeping on back with a pillow placed under knees or sleep on side with knees bent and a pillow between knees. When lying on stomach, place pillow under the abdomen and pelvis; do not use a pillow for your head. ~ ~  CAUTIONS Analgesic/Antipyretic Advice - Acetaminophen: Consider acetaminophen as directed on label or by pharmacist/provider for pain or fever PRECAUTIONS: - Use if there is no history of liver disease, alcoholism, or intake of three or more alcohol drinks per day - Only if approved by provider during pregnancy or when breastfeeding - During pregnancy, acetaminophen should not be taken more than 3 consecutive days without telling provider - Do not exceed recommended dose or frequency ~ 11/16/

## 2012-02-12 ENCOUNTER — Telehealth: Payer: Self-pay | Admitting: Family Medicine

## 2012-02-12 MED ORDER — SYNTHROID 100 MCG PO TABS: 100.0000 ug | ORAL_TABLET | Freq: Every day | ORAL | Status: DC

## 2012-02-12 MED ORDER — LIOTHYRONINE SODIUM 5 MCG PO TABS: 5.0000 ug | ORAL_TABLET | Freq: Every day | ORAL | Status: DC

## 2012-02-12 NOTE — Telephone Encounter (Signed)
Call both of these into [facility] please for #90 with 3 rf

## 2012-02-12 NOTE — Telephone Encounter (Signed)
Patient calling back.  She is going to get medications filled at [facility].  She has found out SHE CAN STAY SYNTHROID same dosage.  The Cytomel/leiothyroxnine Will REMAIN THE SAME AS WELL.  The only difference is she needs these- (1) medication called into [facility]- [phone]  (2)  She needs them to be 90 day supply.

## 2012-02-12 NOTE — Telephone Encounter (Signed)
Patient is calling concerning her medications Levothyroxine/Synthroid and Liothyroxnine/ Cytomel.  Patient was switched to a new insurance [facility] and they will not cover these medications. She has been on these medication for 30 years and is concerned about changes.   She will need her prescriptions changed to Generic and resubmitted to her pharmacy.   She also states that her Cytomel is not covered.  She is thinking she will have to give up this medication. Listed medication on her plan she can afford- Methimozole, levothyroxine, Levoxyl .    (1)  Synthroid will be covered   (2)  Can it be switched to generic Levothyroxine  (3) Does  Dr.Fry  want to increase her dosage on the Synthroid since she cannot afford Cytomel.  Please contact patient regarding medication.

## 2012-04-06 ENCOUNTER — Ambulatory Visit (INDEPENDENT_AMBULATORY_CARE_PROVIDER_SITE_OTHER): Payer: 59 | Admitting: Family Medicine

## 2012-04-06 ENCOUNTER — Encounter: Payer: Self-pay | Admitting: Family Medicine

## 2012-04-06 VITALS — BP 136/82 | HR 75 | Temp 98.5°F | Wt 206.0 lb

## 2012-04-06 DIAGNOSIS — M461 Sacroiliitis, not elsewhere classified: Secondary | ICD-10-CM

## 2012-04-06 DIAGNOSIS — B351 Tinea unguium: Secondary | ICD-10-CM

## 2012-04-06 MED ORDER — DICLOFENAC SODIUM 75 MG PO TBEC: 75.0000 mg | DELAYED_RELEASE_TABLET | Freq: Two times a day (BID) | ORAL | Status: DC

## 2012-04-06 MED ORDER — TERBINAFINE HCL 250 MG PO TABS: 250.0000 mg | ORAL_TABLET | Freq: Every day | ORAL | Status: DC

## 2012-04-06 NOTE — Progress Notes (Signed)
  Subjective:    Patient ID: Lori Durham, female    DOB: 07-Oct-1950, 62 y.o.   MRN: [id number]  HPI Here for 2 issues. First she has had toenail fungus on the left foot for a year, and it is causing the nails to get thick and painful. She tries to trim them back as much as possible. Also she has had stiffness and pain in the left lower back for 5 days. No acute trauma, but she has been moving furniture and unpacking boxes for 2 weeks since she is moving to a new home,. Motrin helps.    Review of Systems  Constitutional: Negative.   Musculoskeletal: Positive for back pain.       Objective:   Physical Exam  Constitutional: She appears well-developed and well-nourished.  Musculoskeletal:       Tender over the left sacroiliac joint and the left lower back. No spasm. Full ROM   Skin:       The left great and second toes have thick yellow nails           Assessment & Plan:  Rest, use ice on the back. Try Diclofenac prn. Try Terbinafine for the nails for 90 days

## 2012-05-13 ENCOUNTER — Ambulatory Visit: Payer: 59 | Admitting: Family Medicine

## 2012-05-30 ENCOUNTER — Other Ambulatory Visit: Payer: Self-pay | Admitting: Family Medicine

## 2012-06-09 ENCOUNTER — Telehealth: Payer: Self-pay | Admitting: Family Medicine

## 2012-06-09 NOTE — Telephone Encounter (Signed)
Patient Information:  Caller Name: Rhodia  Phone: [phone]  Patient: Shamara, Soza  Gender: Female  DOB: 1950/07/07  Age: 62 Years  PCP: Gershon Crane [facility])  Office Follow Up:  Does the office need to follow up with this patient?: No  Instructions For The Office: N/A   Symptoms  Reason For Call & Symptoms: Patient state she has hives.  Reviewed Health History In EMR: Yes  Reviewed Medications In EMR: Yes  Reviewed Allergies In EMR: Yes  Reviewed Surgeries / Procedures: Yes  Date of Onset of Symptoms: 05/26/2012  Treatments Tried: medicated Gold Bond lotion, Benadryl nightly  Treatments Tried Worked: No  Guideline(s) Used:  Hives  Disposition Per Guideline:   See Today or Tomorrow in Office  Reason For Disposition Reached:   Hives persist > 1 week  Advice Given:  Antihistamine  Benadryl (diphenhydramine) is an antihistamine. The adult dose is 25-50 mg. If the hives are still present after 6 hours, repeat the Benadryl.  Call Back If:  Severe hives or severe itching persist more than 24 hours despite taking an antihistamine (e.g., Benadryl)  You become worse.  Patient Will Follow Care Advice:  YES  Appointment Scheduled:  06/10/2012 13:45:00 Appointment Scheduled Provider:  Gershon Crane [facility])

## 2012-06-10 ENCOUNTER — Encounter: Payer: Self-pay | Admitting: Family Medicine

## 2012-06-10 ENCOUNTER — Telehealth: Payer: Self-pay | Admitting: Family Medicine

## 2012-06-10 ENCOUNTER — Ambulatory Visit (INDEPENDENT_AMBULATORY_CARE_PROVIDER_SITE_OTHER): Payer: 59 | Admitting: Family Medicine

## 2012-06-10 VITALS — BP 160/80 | HR 77 | Temp 99.4°F | Wt 208.0 lb

## 2012-06-10 DIAGNOSIS — M810 Age-related osteoporosis without current pathological fracture: Secondary | ICD-10-CM

## 2012-06-10 DIAGNOSIS — R6882 Decreased libido: Secondary | ICD-10-CM

## 2012-06-10 MED ORDER — ESTROGENS CONJUGATED 0.3 MG PO TABS: 0.3000 mg | ORAL_TABLET | Freq: Every day | ORAL | Status: DC

## 2012-06-10 NOTE — Telephone Encounter (Signed)
Pt states the estrogens, conjugated, (PREMARIN) 0.3 MG tablet the Dr Clent Ridges prescribed today does NOT come in generic. Pt instructed to let MD know if it did not so that he could prescribe an alternate that is not so expensive. Pharm: [facility] [address]

## 2012-06-10 NOTE — Telephone Encounter (Signed)
I spoke with pt and she is going to check with insurance company and find out what is on the preferred list and call us back.

## 2012-06-10 NOTE — Progress Notes (Signed)
  Subjective:    Patient ID: Lori Durham, female    DOB: [birth year], 62 y.o.   MRN: [id number]  HPI Here for several issues. First she asks about getting another Prolia injection but she says her insurance company requires a prior authorization. Also she reports that she got some diffuse body aches after the last shot of this. Second she asks for help with a low libido. She was recently remarried and her husband is very interested in sex. She has little desire for this however. She is S/P a hysterectomy. She seems to be through with menopause and no longer gets hot flashes. She denies any anxiety or depression.    Review of Systems  Constitutional: Negative.   Respiratory: Negative.   Cardiovascular: Negative.   Neurological: Negative.   Psychiatric/Behavioral: Negative.        Objective:   Physical Exam  Constitutional: She is oriented to person, place, and time. She appears well-developed and well-nourished.  Neurological: She is alert and oriented to person, place, and time.  Psychiatric: She has a normal mood and affect. Her behavior is normal. Thought content normal.          Assessment & Plan:  We will refer her to Endocrine to consider alternative treatments for her osteoporosis. Start on low dose Premarin and recheck in 90 days

## 2012-06-21 ENCOUNTER — Telehealth: Payer: Self-pay | Admitting: Family Medicine

## 2012-06-21 DIAGNOSIS — M810 Age-related osteoporosis without current pathological fracture: Secondary | ICD-10-CM

## 2012-06-21 NOTE — Telephone Encounter (Signed)
I am not sure what happened but I put in another referral today to Endocrine

## 2012-06-21 NOTE — Telephone Encounter (Addendum)
Pt was seen on 06-10-12 and was suppose to be referred to endocrinologist for alternative treatment for osteoporosis

## 2012-06-21 NOTE — Telephone Encounter (Signed)
I spoke with pt  

## 2012-07-04 ENCOUNTER — Encounter: Payer: Self-pay | Admitting: Internal Medicine

## 2012-07-04 ENCOUNTER — Ambulatory Visit (INDEPENDENT_AMBULATORY_CARE_PROVIDER_SITE_OTHER): Payer: 59 | Admitting: Internal Medicine

## 2012-07-04 VITALS — BP 132/88 | HR 68 | Temp 98.4°F | Resp 10 | Ht 65.5 in | Wt 212.0 lb

## 2012-07-04 DIAGNOSIS — M810 Age-related osteoporosis without current pathological fracture: Secondary | ICD-10-CM

## 2012-07-04 DIAGNOSIS — R5381 Other malaise: Secondary | ICD-10-CM

## 2012-07-04 DIAGNOSIS — E559 Vitamin D deficiency, unspecified: Secondary | ICD-10-CM

## 2012-07-04 MED ORDER — ALENDRONATE SODIUM 70 MG PO TABS: 70.0000 mg | ORAL_TABLET | ORAL | Status: DC

## 2012-07-04 NOTE — Patient Instructions (Addendum)
Please return in a year.  Please join MyChart, I will release the labs there. I sent Fosamax to your pharmacy.  Osteoporosis instructions  How Can I Prevent Falls? Men and women with osteoporosis need to take care not to fall down. Falls can break bones. Some reasons people fall are:   Poor vision    Poor balance    Certain diseases that affect how you walk    Some types of medicine, such as sleeping pills.  Some tips to help prevent falls outdoors are:   Use a cane or walker    Wear rubber-soled shoes so you don't slip    Walk on grass when sidewalks are slippery    In winter, put salt or kitty litter on icy sidewalks.  Some ways to help prevent falls indoors are:   Keep rooms free of clutter, especially on floors    Use plastic or carpet runners on slippery floors    Wear low-heeled shoes that provide good support    Do not walk in socks, stockings, or slippers    Be sure carpets and area rugs have skid-proof backs or are tacked to the floor    Be sure stairs are well lit and have rails on both sides    Put grab bars on bathroom walls near tub, shower, and toilet    Use a rubber bath mat in the shower or tub    Keep a flashlight next to your bed    Use a sturdy step stool with a handrail and wide steps    Add more lights in rooms (and night lights)   Buy a cordless phone to keep with you so that you don't have to rush to the phone when it rings and so that you can call for help if you fall.  (adapted from http://www.niams.HostessTraining.at)  Dietary sources of calcium and vitamin D:  Calcium content (mg) - http://www.niams.https://www.gonzalez.org/  Fortified oatmeal, 1 packet 350  Sardines, canned in oil, with edible bones, 3 oz. 324  Cheddar cheese, 1 oz. shredded 306  Milk, nonfat, 1 cup 302  Milkshake, 1 cup 300  Yogurt, plain, low-fat, 1 cup 300  Soybeans, cooked, 1 cup 261  Tofu, firm, with calcium,  cup 204   Orange juice, fortified with calcium, 6 oz. 200-260 (varies)  Salmon, canned, with edible bones, 3 oz. 181  Pudding, instant, made with 2% milk,  cup 153  Baked beans, 1 cup 142  Cottage cheese, 1% milk fat, 1 cup 138  Spaghetti, lasagna, 1 cup 125  Frozen yogurt, vanilla, soft-serve,  cup 103  Ready-to-eat cereal, fortified with calcium, 1 cup 100-1,000 (varies)  Cheese pizza, 1 slice 100  Fortified waffles, 2 100  Turnip greens, boiled,  cup 99  Broccoli, raw, 1 cup 90  Ice cream, vanilla,  cup 85  Soy or rice milk, fortified with calcium, 1 cup 80-500 (varies)   Vitamin D content (International Units, IU) - https://www.ars.usda.gov Cod liver oil, 1 tablespoon 1,360  Swordfish, cooked, 3 oz 566  Salmon (sockeye), cooked, 3 oz 447  Tuna fish, canned in water, drained, 3 oz 154  Orange juice fortified with vitamin D, 1 cup (check product labels, as amount of added vitamin D varies) 137  Milk, nonfat, reduced fat, and whole, vitamin D-fortified, 1 cup 115-124  Yogurt, fortified with 20% of the daily value for vitamin D, 6 oz 80  Margarine, fortified, 1 tablespoon 60  Sardines, canned in oil, drained, 2 sardines 46  Liver,  beef, cooked, 3 oz 42  Egg, 1 large (vitamin D is found in yolk) 41  Ready-to-eat cereal, fortified with 10% of the daily value for vitamin D, 0.75-1 cup  40  Cheese, Swiss, 1 oz 6

## 2012-07-04 NOTE — Progress Notes (Signed)
Subjective:     Patient ID: Lori Durham, female   DOB: 1950/08/30, 62 y.o.   MRN: [id number]  HPI Lori Durham is a pleasant 62 y/o woman, referred by her PCP, Dr. Clent Ridges, for management of osteoporosis.  She was dx with OP in 2004. She was 62 years old at the time. She tells me that she had hysterectomy in 27 (62 y/o) but per review of the chart, she did not have oophorectomy. No history of fractures. She was seen in the past at [facility], but I do not have these records. She was started on Prolia of which she had at least 8 injections. Her last injection was in 02/05/2012, and it caused her to have hip pain (bilateral) - lasted for <2 days.  She did not try a bisphosphonate before, and she mentions that this was because of the severe osteoporosis, not because of her GERD. No reflux problems anymore.   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 She takes calcium 2500 milligrams daily: Citrucel - divided into doses. She is not sure about how much vitamin D this has. She takes an extra vit D 5000 IU 5/7 days. Does not drink milk every day but likes it, has a yoghurt 5/7 days, likes cheese. She has protein shakes frequently. She also eats green leafy vegetables.   She has FH of OP in mom, and 2 brothers who had to have a hip replaced at 33. She is suspecting that there problems with because of their alcoholic father.  She mention is that she has some more fatigued than normal despite walking 2 miles/day, 5/7 days. She also mentions that she is newly married and has low libido, so Dr. Clent Ridges started her on Premarin 0.3 milligrams daily, however she could not afford this.   Review of Systems - all negative except as per history of present illness Constitutional: no weight gain/loss, no fatigue, no subjective hyperthermia/hypothermia Eyes: no blurry vision, no xerophthalmia ENT: no sore throat, no nodules palpated in throat, no dysphagia/odynophagia, no  hoarseness Cardiovascular: no CP/SOB/palpitations/leg swelling Respiratory: no cough/SOB Gastrointestinal: no N/V/D/C Musculoskeletal: no muscle/joint aches Skin: no rashes Neurological: no tremors/numbness/tingling/dizziness Psychiatric: no depression/anxiety  Past Medical History  Diagnosis Date  . Weight gain   . Hypertension   . Osteoporosis 10-2008    last DEXA  . Hyperlipidemia   . Low back pain     Dr Jeral Fruit  . Varicose veins of both lower extremities with pain   . Asthma     mild, intermittent  . Hypothyroidism     postsurgical- sees Dr Everardo All  . GERD (gastroesophageal reflux disease)   . Vitamin D deficiency   . Obesity     hx of hyperglycemia before surgery.  . Colon polyps   . S/P tonsillectomy and adenoidectomy    Past Surgical History  Procedure Laterality Date  . 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 Abdominal hysterectomy      Partial w/ ovaries intact  . Sleeve gastrectomy  11-15-09    Bariatric per Dr Adolphus Birchwood in [facility]  . Venoablation    . Cholecystectomy, laparoscopic  NOV 2012    [facility]  . Colonoscopy  12-07-11    per Dr. Noe Gens at [facility], clear, repeat in 5 yrs    History   Social History  . Marital Status: Married    Spouse Name: N/A  Number of Children: 1   Occupational History  . HR   Social History Main Topics  . Smoking status: Never Smoker   . Smokeless tobacco: Never Used  . Alcohol Use: 4.2 oz/week    7 Glasses of wine per week     Comment: glass of wine each day  . Drug Use: No  . Sexually Active: Yes -- Female partner(s)   Current Outpatient Prescriptions on File Prior to Visit  Medication Sig Dispense Refill  . acyclovir (ZOVIRAX) 400 MG tablet Take 400 mg by mouth as needed.      . ALPRAZolam (XANAX) 0.25 MG tablet Take 0.25 mg by mouth 3 (three) times daily as needed.      . Calcium Carbonate-Vitamin D (CALCIUM-D PO) Take by mouth daily.        . diclofenac (VOLTAREN) 75  MG EC tablet Take 1 tablet (75 mg total) by mouth 2 (two) times daily.  60 tablet  2  . fish oil-omega-3 fatty acids 1000 MG capsule Take 2 g by mouth daily.      . Glucosamine-Chondroitin 1500-1200 MG/30ML LIQD Take by mouth daily.        Marland Kitchen liothyronine (CYTOMEL) 5 MCG tablet Take 1 tablet (5 mcg total) by mouth daily.  90 tablet  3  . Multiple Vitamin-Folic Acid TABS Take 1 tablet by mouth daily.        Marland Kitchen SYNTHROID 100 MCG tablet Take 1 tablet (100 mcg total) by mouth daily.  90 tablet  3  . terbinafine (LAMISIL) 250 MG tablet TAKE ONE TABLET BY MOUTH ONE TIME DAILY  30 tablet  11  . zinc gluconate 50 MG tablet Take 50 mg by mouth daily.         No current facility-administered medications on file prior to visit.   Allergies  Allergen Reactions  . Hydrocodone     ( cough syrup ) light headed   Family History  Problem Relation Age of Onset  . Hypothyroidism Mother   . Heart disease Mother   . Hypertension Mother   . Alcohol abuse Father   . Arthritis Other     family hx of  . Coronary artery disease Other     family history of  . Hyperlipidemia Other     family history of  . Hypertension Other     family history of  . Heart disease Maternal Grandmother   . Alcohol abuse Paternal Grandfather   . Alcohol abuse Brother   . Heart disease Brother   . Heart attack Brother 42  . Alcohol abuse Brother   . Osteoporosis Brother   . Other Brother     varicose veins  . Osteoporosis Brother    Objective:   Physical Exam BP 132/88  Pulse 68  Temp(Src) 98.4 F (36.9 C) (Oral)  Resp 10  Ht 5' 5.5" (1.664 m)  Wt 212 lb (96.163 kg)  BMI 34.73 kg/m2  SpO2 97% Wt Readings from Last 3 Encounters:  07/04/12 212 lb (96.163 kg)  06/10/12 208 lb (94.348 kg)  04/06/12 206 lb (93.441 kg)   Constitutional: overweight, in NAD Eyes: PERRLA, EOMI, no exophthalmos ENT: moist mucous membranes, no thyromegaly, no cervical lymphadenopathy Cardiovascular: RRR, No MRG Respiratory: CTA  B Gastrointestinal: abdomen soft, NT, ND, BS+ Musculoskeletal: no deformities, strength intact in all 4 Skin: moist, warm, no rashes Neurological: no tremor with outstretched hands, DTR normal in all 4  Assessment:     1. Osteoporosis - on Prolia x  8 doses - insurance now not approving Prolia - last DEXA on 12/04/2010:  Lumbar L1-4: 0.1 (but high SD a before and measurements: -0.8, -0.1, -0.1, 0.3)  Right femoral neck: - 1.8 (BMD increased 6.5% from 11/16/2008 (signif.)  Left femoral neck: - 1.1   Date L1-4 left femoral neck Total left hip Right femoral neck Total right hip  12/04/2010 0.1  -1.1  -1.8   11/20/2010 (-0.8%)  (1.0%)  (6.5%)  11/16/2008 (8.7%)  (-3.0%)  (-8.5%)  08/28/2005 (2.1%)  (11.5%)  (8.9%)  07/19/2002 -  -  -   Plan:     Patient with ten-year history of osteoporosis, initially severe, however I do not have these records to review. The only DEXA scan that I have is after approximately 2 years of Prolia, and this is much improved per patient.  - she also has problems with her insurance paying for Prolia - We discussed about obtaining the drug holiday, however, with Prolia, we can expect her bone mineral density to decrease fast after we stop the medication, therefore I suggest keeping her on a bisphosphonate (for example alendronate) for 1 year before a drug holiday - I sent alendronate 70 mg q. weekly to her pharmacy and I advised her how to take it correctly - We'll check a DEXA scan now, and a repeat in a year - We did discuss about side effects to bisphosphonates to include osteonecrosis of the jaw and atypical fractures and I advised the patient let me know if he develops pain in her jaw or thigh. She does not have upcoming dental work. - The also need to make sure that her calcium and vitamin D levels are normal before we start a medication - I would check these today - Do not her a handout about sources of vitamin D and calcium in foods - we also discussed  about Cytomel and the fact that the blood level fluctuation can impact her bones negatively. However, if she feels well on it, and her tests are normal, she should probably continue - since she complaints of fatigue, and her MCV is elevated, in the context of her previous bariatric surgery, I would check a B12 and a folate level  - I did suggest that she start a B complex supplement daily     Office Visit on 07/04/2012  Component Date Value Range Status  . Sodium 07/04/2012 136  135 - 145 mEq/L Final  . Potassium 07/04/2012 4.1  3.5 - 5.1 mEq/L Final  . Chloride 07/04/2012 100  96 - 112 mEq/L Final  . CO2 07/04/2012 28  19 - 32 mEq/L Final  . Glucose, Bld 07/04/2012 85  70 - 99 mg/dL Final  . BUN 78/29/5621 24* 6 - 23 mg/dL Final  . Creatinine, Ser 07/04/2012 0.7  0.4 - 1.2 mg/dL Final  . Calcium 30/86/5784 9.6  8.4 - 10.5 mg/dL Final  . GFR 69/62/9528 90.87  >60.00 mL/min Final  . Vit D, 25-Hydroxy 07/04/2012 60  30 - 89 ng/mL Final   Comment: This assay accurately quantifies Vitamin D, which is the sum of the                          25-Hydroxy forms of Vitamin D2 and D3.  Studies have shown that the                          optimum concentration of 25-Hydroxy  Vitamin D is 30 ng/mL or higher.                           Concentrations of Vitamin D between 20 and 29 ng/mL are considered to                          be insufficient and concentrations less than 20 ng/mL are considered                          to be deficient for Vitamin D.  . Vitamin B-12 07/04/2012 414  211 - 911 pg/mL Final  . RBC Folate 07/04/2012 TEST NOT PERFORMED  >=366 ng/mL Final   Reference range not established for pediatric patients.

## 2012-07-05 ENCOUNTER — Other Ambulatory Visit: Payer: Self-pay | Admitting: *Deleted

## 2012-07-05 LAB — BASIC METABOLIC PANEL
BUN: 24 mg/dL — ABNORMAL HIGH (ref 6–23)
CO2: 28 mEq/L (ref 19–32)
Calcium: 9.6 mg/dL (ref 8.4–10.5)
Creatinine, Ser: 0.7 mg/dL (ref 0.4–1.2)
Glucose, Bld: 85 mg/dL (ref 70–99)

## 2012-07-05 LAB — VITAMIN B12: Vitamin B-12: 414 pg/mL (ref 211–911)

## 2012-07-05 LAB — VITAMIN D 25 HYDROXY (VIT D DEFICIENCY, FRACTURES): Vit D, 25-Hydroxy: 60 ng/mL (ref 30–89)

## 2012-07-06 ENCOUNTER — Telehealth: Payer: Self-pay | Admitting: Internal Medicine

## 2012-07-06 NOTE — Telephone Encounter (Signed)
Called [facility] to schedule pt's DEXA. She can only have a scan every 2 yrs. She is unable to have one after 11/19/12. Scheduled her for 11/22/12 at 3:30 pm. Spoke with pt and gave her the date and time of the appt. Please be advised.

## 2012-07-06 NOTE — Telephone Encounter (Signed)
The patient scheduled to come in for blood work tomorrow at 10:30.  She stated she need labs that she "missed" on her last apt.

## 2012-07-07 ENCOUNTER — Other Ambulatory Visit: Payer: 59

## 2012-07-07 DIAGNOSIS — M810 Age-related osteoporosis without current pathological fracture: Secondary | ICD-10-CM

## 2012-07-07 DIAGNOSIS — E559 Vitamin D deficiency, unspecified: Secondary | ICD-10-CM

## 2012-07-07 DIAGNOSIS — R5381 Other malaise: Secondary | ICD-10-CM

## 2012-07-11 ENCOUNTER — Other Ambulatory Visit: Payer: Self-pay | Admitting: *Deleted

## 2012-07-11 ENCOUNTER — Encounter: Payer: Self-pay | Admitting: Internal Medicine

## 2012-07-12 NOTE — Progress Notes (Signed)
Francis Dowse Callicutt, CMA at 07/06/2012 10:21 AM:  [facility] to schedule pt's DEXA. She can only have a scan every 2 yrs. She is unable to have one after 11/19/12. Scheduled her for 11/22/12 at 3:30 pm. Spoke with pt and gave her the date and time of the appt. Please be advised.

## 2012-07-20 ENCOUNTER — Ambulatory Visit (INDEPENDENT_AMBULATORY_CARE_PROVIDER_SITE_OTHER): Payer: 59 | Admitting: Family Medicine

## 2012-07-20 ENCOUNTER — Encounter: Payer: Self-pay | Admitting: Family Medicine

## 2012-07-20 VITALS — BP 148/80 | HR 69 | Temp 98.8°F | Wt 212.0 lb

## 2012-07-20 DIAGNOSIS — M899 Disorder of bone, unspecified: Secondary | ICD-10-CM

## 2012-07-20 NOTE — Progress Notes (Signed)
  Subjective:    Patient ID: Lori Durham, female    DOB: [birth year], 62 y.o.   MRN: [id number]  HPI Here for a recurrent mild but sharp pain in the pubic area. This started 2 months ago and bothered her for 2-3 days, and then it went away. Now for the past 2 days it has returned. No abdominal pain, no urinary or bowel issues, no vaginal DC. No recent trauma. She has recently started to exercise however by walking her dog at a very brisk pace daily. Part of this walk includes going up a very steep hill which she does very aggressively.    Review of Systems  Constitutional: Negative.   Gastrointestinal: Negative.   Endocrine: Negative.        Objective:   Physical Exam  Constitutional: She appears well-developed and well-nourished. No distress.  Abdominal: Soft. Bowel sounds are normal. She exhibits no distension and no mass. There is no tenderness. There is no rebound and no guarding.  Genitourinary:  The perineal area is unremarkable  Musculoskeletal:  Tender over the pubis, no swelling          Assessment & Plan:  Pubic pain, possibly inflammation of the attachment of the abdominal musculature. Rest, avoid walking up steep hills as much as possible. Recheck prn

## 2012-07-21 ENCOUNTER — Encounter: Payer: Self-pay | Admitting: Family Medicine

## 2012-08-17 ENCOUNTER — Encounter: Payer: Self-pay | Admitting: Internal Medicine

## 2012-10-25 ENCOUNTER — Encounter: Payer: Self-pay | Admitting: Internal Medicine

## 2012-11-02 ENCOUNTER — Encounter: Payer: Self-pay | Admitting: Internal Medicine

## 2012-11-23 ENCOUNTER — Encounter: Payer: Self-pay | Admitting: Family Medicine

## 2012-12-12 ENCOUNTER — Encounter: Payer: Self-pay | Admitting: Family Medicine

## 2013-01-26 ENCOUNTER — Other Ambulatory Visit: Payer: 59

## 2013-01-26 ENCOUNTER — Other Ambulatory Visit: Payer: Self-pay

## 2013-02-03 ENCOUNTER — Encounter: Payer: 59 | Admitting: Family Medicine

## 2013-02-17 ENCOUNTER — Ambulatory Visit (INDEPENDENT_AMBULATORY_CARE_PROVIDER_SITE_OTHER): Payer: 59 | Admitting: Family Medicine

## 2013-02-17 ENCOUNTER — Other Ambulatory Visit (INDEPENDENT_AMBULATORY_CARE_PROVIDER_SITE_OTHER): Payer: 59

## 2013-02-17 DIAGNOSIS — Z23 Encounter for immunization: Secondary | ICD-10-CM

## 2013-02-17 DIAGNOSIS — Z0000 Encounter for general adult medical examination without abnormal findings: Secondary | ICD-10-CM

## 2013-02-17 LAB — POCT URINALYSIS DIPSTICK
Ketones, UA: NEGATIVE
Leukocytes, UA: NEGATIVE
Nitrite, UA: NEGATIVE
Protein, UA: NEGATIVE
Urobilinogen, UA: 0.2
pH, UA: 8.5

## 2013-02-17 LAB — HEPATIC FUNCTION PANEL
ALT: 19 U/L (ref 0–35)
Albumin: 4.1 g/dL (ref 3.5–5.2)
Bilirubin, Direct: 0.1 mg/dL (ref 0.0–0.3)
Total Bilirubin: 1.3 mg/dL — ABNORMAL HIGH (ref 0.3–1.2)
Total Protein: 6.8 g/dL (ref 6.0–8.3)

## 2013-02-17 LAB — LDL CHOLESTEROL, DIRECT: Direct LDL: 149.6 mg/dL

## 2013-02-17 LAB — BASIC METABOLIC PANEL
BUN: 13 mg/dL (ref 6–23)
CO2: 30 mEq/L (ref 19–32)
Calcium: 9.6 mg/dL (ref 8.4–10.5)
Chloride: 103 mEq/L (ref 96–112)
Creatinine, Ser: 0.6 mg/dL (ref 0.4–1.2)
Glucose, Bld: 89 mg/dL (ref 70–99)
Potassium: 3.7 mEq/L (ref 3.5–5.1)

## 2013-02-17 LAB — CBC WITH DIFFERENTIAL/PLATELET
Basophils Relative: 0.3 % (ref 0.0–3.0)
Eosinophils Absolute: 0.3 10*3/uL (ref 0.0–0.7)
Eosinophils Relative: 5.4 % — ABNORMAL HIGH (ref 0.0–5.0)
HCT: 42.8 % (ref 36.0–46.0)
Lymphocytes Relative: 39.8 % (ref 12.0–46.0)
Lymphs Abs: 1.9 10*3/uL (ref 0.7–4.0)
MCHC: 34.6 g/dL (ref 30.0–36.0)
MCV: 104.4 fl — ABNORMAL HIGH (ref 78.0–100.0)
Monocytes Absolute: 0.4 10*3/uL (ref 0.1–1.0)
Neutro Abs: 2.2 10*3/uL (ref 1.4–7.7)
RBC: 4.1 Mil/uL (ref 3.87–5.11)
RDW: 12 % (ref 11.5–14.6)
WBC: 4.7 10*3/uL (ref 4.5–10.5)

## 2013-02-17 LAB — TSH: TSH: 1.74 u[IU]/mL (ref 0.35–5.50)

## 2013-02-17 LAB — LIPID PANEL: VLDL: 24.8 mg/dL (ref 0.0–40.0)

## 2013-02-23 ENCOUNTER — Ambulatory Visit (INDEPENDENT_AMBULATORY_CARE_PROVIDER_SITE_OTHER): Payer: 59 | Admitting: Family Medicine

## 2013-02-23 ENCOUNTER — Encounter: Payer: Self-pay | Admitting: Family Medicine

## 2013-02-23 VITALS — BP 144/82 | HR 75 | Temp 99.5°F | Ht 65.25 in | Wt 213.0 lb

## 2013-02-23 DIAGNOSIS — Z0000 Encounter for general adult medical examination without abnormal findings: Secondary | ICD-10-CM

## 2013-02-23 DIAGNOSIS — Z23 Encounter for immunization: Secondary | ICD-10-CM

## 2013-02-23 DIAGNOSIS — Z2911 Encounter for prophylactic immunotherapy for respiratory syncytial virus (RSV): Secondary | ICD-10-CM

## 2013-02-23 MED ORDER — MECLIZINE HCL 25 MG PO TABS: 25.0000 mg | ORAL_TABLET | ORAL | Status: DC | PRN

## 2013-02-23 MED ORDER — DICLOFENAC SODIUM 1 % TD GEL: 4.0000 g | Freq: Four times a day (QID) | TRANSDERMAL | Status: DC

## 2013-02-23 MED ORDER — LIOTHYRONINE SODIUM 5 MCG PO TABS: 5.0000 ug | ORAL_TABLET | Freq: Every day | ORAL | Status: DC

## 2013-02-23 MED ORDER — ALPRAZOLAM 0.25 MG PO TABS: 0.2500 mg | ORAL_TABLET | Freq: Three times a day (TID) | ORAL | Status: DC | PRN

## 2013-02-23 MED ORDER — LEVOTHYROXINE SODIUM 100 MCG PO TABS: 100.0000 ug | ORAL_TABLET | Freq: Every day | ORAL | Status: DC

## 2013-02-23 MED ORDER — ACYCLOVIR 400 MG PO TABS: 400.0000 mg | ORAL_TABLET | Freq: Every day | ORAL | Status: DC

## 2013-02-23 NOTE — Progress Notes (Signed)
Pre visit review using our clinic review tool, if applicable. No additional management support is needed unless otherwise documented below in the visit note. 

## 2013-02-23 NOTE — Progress Notes (Signed)
   Subjective:    Patient ID: Lori Durham, female    DOB: 1951/02/28, 62 y.o.   MRN: [id number]  HPI 62 yr old female for a cpx. She feels well in general. She watches her diet but admits to getting little exercise.    Review of Systems  Constitutional: Negative.   HENT: Negative.   Eyes: Negative.   Respiratory: Negative.   Cardiovascular: Negative.   Gastrointestinal: Negative.   Genitourinary: Negative for dysuria, urgency, frequency, hematuria, flank pain, decreased urine volume, enuresis, difficulty urinating, pelvic pain and dyspareunia.  Musculoskeletal: Negative.   Skin: Negative.   Neurological: Negative.   Psychiatric/Behavioral: Negative.        Objective:   Physical Exam  Constitutional: She is oriented to person, place, and time. She appears well-developed and well-nourished. No distress.  HENT:  Head: Normocephalic and atraumatic.  Right Ear: External ear normal.  Left Ear: External ear normal.  Nose: Nose normal.  Mouth/Throat: Oropharynx is clear and moist. No oropharyngeal exudate.  Eyes: Conjunctivae and EOM are normal. Pupils are equal, round, and reactive to light. No scleral icterus.  Neck: Normal range of motion. Neck supple. No JVD present. No thyromegaly present.  Cardiovascular: Normal rate, regular rhythm, normal heart sounds and intact distal pulses.  Exam reveals no gallop and no friction rub.   No murmur heard. EKG normal   Pulmonary/Chest: Effort normal and breath sounds normal. No respiratory distress. She has no wheezes. She has no rales. She exhibits no tenderness.  Abdominal: Soft. Bowel sounds are normal. She exhibits no distension and no mass. There is no tenderness. There is no rebound and no guarding.  Musculoskeletal: Normal range of motion. She exhibits no edema and no tenderness.  Lymphadenopathy:    She has no cervical adenopathy.  Neurological: She is alert and oriented to person, place, and time. She has normal reflexes. No  cranial nerve deficit. She exhibits normal muscle tone. Coordination normal.  Skin: Skin is warm and dry. No rash noted. No erythema.  Psychiatric: She has a normal mood and affect. Her behavior is normal. Judgment and thought content normal.          Assessment & Plan:  Well exam.

## 2013-02-23 NOTE — Addendum Note (Signed)
Addended by: Aniceto Boss A on: 02/23/2013 12:15 PM   Modules accepted: Orders

## 2013-02-24 ENCOUNTER — Encounter: Payer: Self-pay | Admitting: Family Medicine

## 2013-02-24 NOTE — Telephone Encounter (Signed)
Call in Raloxifene 60 mg to take daily, for a one year supply

## 2013-02-27 ENCOUNTER — Encounter: Payer: Self-pay | Admitting: Family Medicine

## 2013-02-27 MED ORDER — RALOXIFENE HCL 60 MG PO TABS: 60.0000 mg | ORAL_TABLET | Freq: Every day | ORAL | Status: DC

## 2013-02-28 ENCOUNTER — Encounter: Payer: Self-pay | Admitting: Family Medicine

## 2013-03-01 NOTE — Telephone Encounter (Signed)
Tell her that it is okay to take the meds now

## 2013-03-30 ENCOUNTER — Encounter: Payer: Self-pay | Admitting: Family Medicine

## 2013-03-31 ENCOUNTER — Encounter: Payer: Self-pay | Admitting: Family Medicine

## 2013-03-31 NOTE — Telephone Encounter (Signed)
Tell her that I don't think she needs to worry. This is a normal reaction to stress and should be temporary. Use Xanax prn. See me if it does not settle down by 2 weeks

## 2013-05-18 ENCOUNTER — Encounter: Payer: Self-pay | Admitting: Family Medicine

## 2013-05-19 NOTE — Telephone Encounter (Signed)
Tell her to drink plenty of water along with some Gatorade. Try Imodium AD prn

## 2013-06-05 ENCOUNTER — Encounter: Payer: Self-pay | Admitting: Family Medicine

## 2013-06-06 MED ORDER — RALOXIFENE HCL 60 MG PO TABS: 60.0000 mg | ORAL_TABLET | Freq: Every day | ORAL | Status: DC

## 2013-06-06 NOTE — Telephone Encounter (Signed)
She is correct, I forgot about this conversation. Let's try Evista, since this works in a very different way and never upsets the stomach. Call in Evista 60 mg daily for one year

## 2013-06-06 NOTE — Telephone Encounter (Signed)
Tell her that yes there are alternatives, but we referred her to Dr. Elvera LennoxGherghe for this several years ago, so she should ask Dr. Elvera LennoxGherghe this question

## 2013-07-05 ENCOUNTER — Encounter: Payer: Self-pay | Admitting: Family Medicine

## 2013-07-05 DIAGNOSIS — E039 Hypothyroidism, unspecified: Secondary | ICD-10-CM

## 2013-07-06 NOTE — Telephone Encounter (Signed)
We could probably do this but I would like to get a full thyroid panel first to see where we are. Have her set up a lab appt and I will put in the orders

## 2013-07-07 ENCOUNTER — Other Ambulatory Visit (INDEPENDENT_AMBULATORY_CARE_PROVIDER_SITE_OTHER): Payer: 59

## 2013-07-07 ENCOUNTER — Encounter: Payer: Self-pay | Admitting: *Deleted

## 2013-07-07 DIAGNOSIS — E039 Hypothyroidism, unspecified: Secondary | ICD-10-CM

## 2013-07-07 LAB — T3, FREE: T3, Free: 2.9 pg/mL (ref 2.3–4.2)

## 2013-07-07 LAB — T4, FREE: FREE T4: 1.03 ng/dL (ref 0.60–1.60)

## 2013-07-07 LAB — TSH: TSH: 1.52 u[IU]/mL (ref 0.35–5.50)

## 2013-07-11 ENCOUNTER — Encounter: Payer: Self-pay | Admitting: Family Medicine

## 2013-07-11 NOTE — Telephone Encounter (Signed)
Pt recently had thyroid checked. Do you still recommend both thyroid medications?

## 2013-07-12 MED ORDER — LIOTHYRONINE SODIUM 5 MCG PO TABS: 5.0000 ug | ORAL_TABLET | Freq: Every day | ORAL | Status: DC

## 2013-07-12 MED ORDER — LEVOTHYROXINE SODIUM 100 MCG PO TABS: 100.0000 ug | ORAL_TABLET | Freq: Every day | ORAL | Status: DC

## 2013-07-12 MED ORDER — LEVOTHYROXINE SODIUM 150 MCG PO TABS: 150.0000 ug | ORAL_TABLET | Freq: Every day | ORAL | Status: DC

## 2013-07-12 NOTE — Telephone Encounter (Signed)
Refill BOTH thyroid meds for a year

## 2013-07-12 NOTE — Telephone Encounter (Signed)
She is correct, we did have this conversation. Stop the Cytomel and increase the Synthroid to 150 mcg daily. Call in one year supply. Recheck a TSH in 60 days

## 2013-07-14 ENCOUNTER — Other Ambulatory Visit: Payer: Self-pay | Admitting: Family Medicine

## 2013-07-14 DIAGNOSIS — E039 Hypothyroidism, unspecified: Secondary | ICD-10-CM

## 2013-07-14 NOTE — Telephone Encounter (Signed)
Yes just set up a TSH in 60 days

## 2013-07-19 ENCOUNTER — Encounter: Payer: Self-pay | Admitting: Family Medicine

## 2013-07-19 ENCOUNTER — Telehealth: Payer: Self-pay | Admitting: Family Medicine

## 2013-07-19 NOTE — Telephone Encounter (Signed)
I sent pt the below message by way of my chart.

## 2013-07-19 NOTE — Telephone Encounter (Signed)
Patient Information:  Caller Name: Lori Durham  Phone: [phone]  Patient: Lori Durham, Lori Durham  Gender: Female  DOB: September 21, 1950  Age: 63 Years  PCP: Lori Durham, Lori Durham [facility](Family Practice)  Office Follow Up:  Does the office need to follow up with this patient?: Yes  Instructions For The Office: Please contact patient regarding medication issues  RN Note:  Patient asking for medication changes.  She is concerned about her new symptoms of tiredness, concentration, emotional and weight gain.  Requesting Dr. Clent RidgesFry review and someone contact her if Rx are called in.  Symptoms  Reason For Call & Symptoms: Patient states she is having issues with her Thyroid medication..  She states that her insurance DID NOT  covered her synthroid  and she switched to generic  Levothyroxine  and changed her dosage.  She also stopped the Cytomel.  She complains of being tired, emotional, concentrations issues and weight gain.  (1) Patient wants to  Stop the Generic Levothyroixine and go back to regular Synthroid.   She is requesting a new  a 90 day script sent to [facility]  (2) She wants to start back on her Cytomel  . She states she already has a script for that.   She is asking that Dr. Clent RidgesFry review.  Reviewed Health History In EMR: N/A  Reviewed Medications In EMR: N/A  Reviewed Allergies In EMR: N/A  Reviewed Surgeries / Procedures: N/A  Date of Onset of Symptoms: 07/19/2013  Guideline(s) Used:  No Protocol Available - Information Only  Disposition Per Guideline:   Discuss with PCP and Callback by Nurse Today  Reason For Disposition Reached:   Nursing judgment  Advice Given:  Call Back If:  New symptoms develop  You become worse.  Patient Will Follow Care Advice:  YES

## 2013-07-19 NOTE — Telephone Encounter (Signed)
I think her thyroid issue is too complicated for me to handle, and I suggest she return to see Dr. Elvera LennoxGherghe who is a thyroid specialist. I will not change her meds any more at this time. She will not need a referral, just tell her to call and set up an appt soon

## 2013-07-20 ENCOUNTER — Encounter: Payer: Self-pay | Admitting: Family Medicine

## 2013-07-20 MED ORDER — LEVOTHYROXINE SODIUM 150 MCG PO TABS: 150.0000 ug | ORAL_TABLET | Freq: Every day | ORAL | Status: DC

## 2013-07-20 MED ORDER — LIOTHYRONINE SODIUM 5 MCG PO TABS: 5.0000 ug | ORAL_TABLET | Freq: Every day | ORAL | Status: DC

## 2013-07-20 NOTE — Telephone Encounter (Signed)
Okay. Get back on Name Brand Synthroid and generic Cytomel 5 mg daily. Call in one year supply of both

## 2013-07-21 MED ORDER — LEVOTHYROXINE SODIUM 100 MCG PO TABS: 100.0000 ug | ORAL_TABLET | Freq: Every day | ORAL | Status: DC

## 2013-07-21 MED ORDER — LIOTHYRONINE SODIUM 5 MCG PO TABS: 5.0000 ug | ORAL_TABLET | Freq: Every day | ORAL | Status: DC

## 2013-08-08 ENCOUNTER — Encounter: Payer: Self-pay | Admitting: Family Medicine

## 2013-12-12 ENCOUNTER — Encounter: Payer: Self-pay | Admitting: Family Medicine

## 2013-12-12 NOTE — Telephone Encounter (Signed)
Can you put pt on injection schedule for 12/14/13 Thursday for flu vaccine? I have already spoken with pt, thanks.

## 2013-12-14 ENCOUNTER — Ambulatory Visit (INDEPENDENT_AMBULATORY_CARE_PROVIDER_SITE_OTHER): Payer: 59 | Admitting: Family Medicine

## 2013-12-14 DIAGNOSIS — Z23 Encounter for immunization: Secondary | ICD-10-CM

## 2013-12-18 ENCOUNTER — Encounter: Payer: Self-pay | Admitting: Family Medicine

## 2013-12-18 ENCOUNTER — Ambulatory Visit (INDEPENDENT_AMBULATORY_CARE_PROVIDER_SITE_OTHER): Payer: 59 | Admitting: Family Medicine

## 2013-12-18 VITALS — BP 163/95 | HR 79 | Temp 99.3°F | Ht 65.25 in | Wt 225.0 lb

## 2013-12-18 DIAGNOSIS — E890 Postprocedural hypothyroidism: Secondary | ICD-10-CM

## 2013-12-18 DIAGNOSIS — K047 Periapical abscess without sinus: Secondary | ICD-10-CM

## 2013-12-18 LAB — T3, FREE: T3, Free: 2.9 pg/mL (ref 2.3–4.2)

## 2013-12-18 LAB — T4, FREE: FREE T4: 0.9 ng/dL (ref 0.60–1.60)

## 2013-12-18 LAB — TSH: TSH: 1.17 u[IU]/mL (ref 0.35–4.50)

## 2013-12-18 MED ORDER — AMOXICILLIN 875 MG PO TABS: 875.0000 mg | ORAL_TABLET | Freq: Two times a day (BID) | ORAL | Status: DC

## 2013-12-18 NOTE — Progress Notes (Signed)
   Subjective:    Patient ID: Lori Durham, female    DOB: [birth year], 63 y.o.   MRN: [id number]  HPI Here for several things. First she has had some pain around the left jaw and some swelling of the nodes in the left neck for one week. It hurts to chew at times. No fever. No ST or sinus drainage. Also she thinks her thyroid dose is too low because she is tired all the time ans has gained some weight. She wants to go back to Name Brand Only Synthroid.    Review of Systems  Constitutional: Positive for fatigue and unexpected weight change. Negative for fever.  HENT: Positive for dental problem. Negative for congestion, ear pain, postnasal drip, sinus pressure and sore throat.   Eyes: Negative.   Respiratory: Negative.        Objective:   Physical Exam  Constitutional: She appears well-developed and well-nourished.  HENT:  Right Ear: External ear normal.  Left Ear: External ear normal.  Nose: Nose normal.  Mouth/Throat: Oropharynx is clear and moist.  Tender over the left lower jaw, the teeth and gums appear normal   Eyes: Conjunctivae are normal.  Neck: No thyromegaly present.  Tender mildly swollen left AC nodes   Pulmonary/Chest: Effort normal and breath sounds normal.          Assessment & Plan:  She likely has an abscessed tooth. Treat with Amoxicillin, and she will set up an appt with her dentist soon. Check a thyroid panel today

## 2013-12-18 NOTE — Progress Notes (Signed)
Pre visit review using our clinic review tool, if applicable. No additional management support is needed unless otherwise documented below in the visit note. 

## 2013-12-19 ENCOUNTER — Encounter: Payer: Self-pay | Admitting: Family Medicine

## 2013-12-19 NOTE — Telephone Encounter (Signed)
Increase the Synthroid dose to 125 mcg daily. Call this in for one year (NAME BRAND ONLY) and recheck labs in 90 days. Tell her to take the antibiotic that was prescribed since it could be an infected salivary gland, as we discussed. If this is not better by the first of next week, see me again

## 2013-12-20 ENCOUNTER — Encounter: Payer: Self-pay | Admitting: Family Medicine

## 2013-12-20 DIAGNOSIS — M542 Cervicalgia: Secondary | ICD-10-CM

## 2013-12-20 MED ORDER — SYNTHROID 125 MCG PO TABS: 125.0000 ug | ORAL_TABLET | Freq: Every day | ORAL | Status: DC

## 2013-12-21 ENCOUNTER — Encounter: Payer: Self-pay | Admitting: Family Medicine

## 2013-12-21 NOTE — Telephone Encounter (Signed)
Tell her I understand and this needs to be checked out. I have ordered some labs (CBC and BMET) as well as a CT scan of her neck. We will go from there

## 2013-12-28 ENCOUNTER — Inpatient Hospital Stay: Admission: RE | Admit: 2013-12-28 | Payer: 59 | Source: Ambulatory Visit

## 2013-12-28 ENCOUNTER — Ambulatory Visit (INDEPENDENT_AMBULATORY_CARE_PROVIDER_SITE_OTHER)
Admission: RE | Admit: 2013-12-28 | Discharge: 2013-12-28 | Disposition: A | Discharge status: Home / Self Care | Payer: 59 | Source: Ambulatory Visit | Attending: Family Medicine | Admitting: Family Medicine

## 2013-12-28 ENCOUNTER — Telehealth: Payer: Self-pay | Admitting: Family Medicine

## 2013-12-28 DIAGNOSIS — M542 Cervicalgia: Secondary | ICD-10-CM

## 2013-12-28 MED ORDER — IOHEXOL 300 MG/ML  SOLN
80.0000 mL | Freq: Once | INTRAMUSCULAR | Status: AC | PRN
  Administered 2013-12-28: 75 mL via INTRAVENOUS

## 2013-12-28 NOTE — Telephone Encounter (Signed)
Pt insurance  denied   The request  For CT SOFT TISSUE NECK W CONTRAST  [facility] is [facility] this  The doctor may [facility] to do a peer to peer @[phone] case# [id number] for cpt code [id number] pt ID # [id number] .

## 2013-12-29 ENCOUNTER — Ambulatory Visit (INDEPENDENT_AMBULATORY_CARE_PROVIDER_SITE_OTHER): Payer: 59 | Admitting: Family Medicine

## 2013-12-29 ENCOUNTER — Encounter: Payer: Self-pay | Admitting: Family Medicine

## 2013-12-29 VITALS — BP 156/92 | HR 110 | Temp 99.4°F | Ht 65.25 in | Wt 228.0 lb

## 2013-12-29 DIAGNOSIS — L509 Urticaria, unspecified: Secondary | ICD-10-CM

## 2013-12-29 DIAGNOSIS — G9001 Carotid sinus syncope: Secondary | ICD-10-CM

## 2013-12-29 DIAGNOSIS — E039 Hypothyroidism, unspecified: Secondary | ICD-10-CM

## 2013-12-29 LAB — CBC WITH DIFFERENTIAL/PLATELET
BASOS ABS: 0 10*3/uL (ref 0.0–0.1)
Basophils Relative: 0.3 % (ref 0.0–3.0)
Eosinophils Absolute: 0.2 10*3/uL (ref 0.0–0.7)
Eosinophils Relative: 3.5 % (ref 0.0–5.0)
HEMATOCRIT: 49.1 % — AB (ref 36.0–46.0)
Hemoglobin: 16.5 g/dL — ABNORMAL HIGH (ref 12.0–15.0)
LYMPHS ABS: 1.1 10*3/uL (ref 0.7–4.0)
Lymphocytes Relative: 15.2 % (ref 12.0–46.0)
MCHC: 33.7 g/dL (ref 30.0–36.0)
MCV: 106.5 fl — AB (ref 78.0–100.0)
MONO ABS: 0.5 10*3/uL (ref 0.1–1.0)
Monocytes Relative: 6.5 % (ref 3.0–12.0)
Neutro Abs: 5.2 10*3/uL (ref 1.4–7.7)
Neutrophils Relative %: 74.5 % (ref 43.0–77.0)
PLATELETS: 237 10*3/uL (ref 150.0–400.0)
RBC: 4.61 Mil/uL (ref 3.87–5.11)
RDW: 12.5 % (ref 11.5–15.5)
WBC: 7 10*3/uL (ref 4.0–10.5)

## 2013-12-29 LAB — BASIC METABOLIC PANEL
BUN: 22 mg/dL (ref 6–23)
CHLORIDE: 101 meq/L (ref 96–112)
CO2: 24 mEq/L (ref 19–32)
Calcium: 9.6 mg/dL (ref 8.4–10.5)
Creatinine, Ser: 1 mg/dL (ref 0.4–1.2)
GFR: 57.26 mL/min — ABNORMAL LOW (ref >60.00)
GLUCOSE: 104 mg/dL — AB (ref 70–99)
Potassium: 4.7 mEq/L (ref 3.5–5.1)
Sodium: 137 mEq/L (ref 135–145)

## 2013-12-29 LAB — TSH: TSH: 0.74 u[IU]/mL (ref 0.35–4.50)

## 2013-12-29 MED ORDER — METHYLPREDNISOLONE ACETATE 40 MG/ML IJ SUSP
120.0000 mg | Freq: Once | INTRAMUSCULAR | Status: AC
  Administered 2013-12-29: 120 mg via INTRAMUSCULAR

## 2013-12-29 NOTE — Addendum Note (Signed)
Addended by: Aniceto BossNIMMONS, Baby Gieger A on: 12/29/2013 12:20 PM   Modules accepted: Orders

## 2013-12-29 NOTE — Progress Notes (Signed)
   Subjective:    Patient ID: Lori Durham, female    DOB: 1951/02/16, 63 y.o.   MRN: [id number]  HPI Here for several reasons. First of all she has had an allergic reaction to the contrast media used yesterday to get a CT scan of her neck. Last night she developed stomach cramps, diarrhea, and an itchy red rash all over her body. No tongue swelling or SOB. She has been taking Benadryl. Today the stomach cramps are gone but she still has the rash. We had ordered the CT scan to investigate some swelling and pain she has been having in the left neck area. She had recently been treated by her dentist for a dental infection, and this has completely cleared up. The scan report was normal.    Review of Systems  Constitutional: Negative.   HENT: Negative.   Respiratory: Negative.   Cardiovascular: Negative.   Gastrointestinal: Negative.   Skin: Positive for rash.       Objective:   Physical Exam  Constitutional: She is oriented to person, place, and time. She appears well-developed and well-nourished. No distress.  HENT:  Right Ear: External ear normal.  Left Ear: External ear normal.  Nose: Nose normal.  Mouth/Throat: Oropharynx is clear and moist. No oropharyngeal exudate.  Eyes: Conjunctivae are normal. Pupils are equal, round, and reactive to light.  Neck: Normal range of motion. Neck supple. No thyromegaly present.  Mildly tender in the left anterior neck just under the mandible. No masses felt   Cardiovascular: Normal rate, regular rhythm, normal heart sounds and intact distal pulses.   Pulmonary/Chest: Effort normal and breath sounds normal. No respiratory distress. She has no wheezes. She has no rales.  Abdominal: Soft. Bowel sounds are normal. She exhibits no distension and no mass. There is no tenderness. There is no rebound and no guarding.  Lymphadenopathy:    She has no cervical adenopathy.  Neurological: She is alert and oriented to person, place, and time.  Skin:    Diffuse erythema with a few wheals           Assessment & Plan:  She has had an allergic reaction to contrast media. She will continue to take 50 mg of Benadryl every 4 hours prn and she is given a steroid shot today. 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 She was reassured that this is benign and self limited. The steroid shot may help alleviate this as well.

## 2013-12-29 NOTE — Progress Notes (Signed)
Pre visit review using our clinic review tool, if applicable. No additional management support is needed unless otherwise documented below in the visit note. 

## 2013-12-29 NOTE — Telephone Encounter (Signed)
Apparently she did have the scan done, and we will see her today

## 2014-01-01 ENCOUNTER — Encounter: Payer: Self-pay | Admitting: Family Medicine

## 2014-01-01 MED ORDER — PREDNISONE (PAK) 10 MG PO TABS: ORAL_TABLET | ORAL | Status: DC

## 2014-01-01 NOTE — Telephone Encounter (Signed)
I think she needs a prednisone taper. Call in Prednisone 10 mg to take 4 tabs a day for 4 days, then 3 a day for 4 days, then 2 a day for 4 days, then 1 a day for 4 days, then stop, #60 with no rf

## 2014-01-03 ENCOUNTER — Encounter: Payer: Self-pay | Admitting: Family Medicine

## 2014-01-03 NOTE — Telephone Encounter (Signed)
See my report on her results

## 2014-03-02 ENCOUNTER — Other Ambulatory Visit (INDEPENDENT_AMBULATORY_CARE_PROVIDER_SITE_OTHER): Payer: 59

## 2014-03-02 DIAGNOSIS — Z0000 Encounter for general adult medical examination without abnormal findings: Secondary | ICD-10-CM

## 2014-03-02 LAB — CBC WITH DIFFERENTIAL/PLATELET
BASOS ABS: 0 10*3/uL (ref 0.0–0.1)
BASOS PCT: 0.4 % (ref 0.0–3.0)
Eosinophils Absolute: 0.3 10*3/uL (ref 0.0–0.7)
Eosinophils Relative: 5.8 % — ABNORMAL HIGH (ref 0.0–5.0)
HCT: 43.1 % (ref 36.0–46.0)
HEMOGLOBIN: 14.5 g/dL (ref 12.0–15.0)
Lymphocytes Relative: 31.6 % (ref 12.0–46.0)
Lymphs Abs: 1.8 10*3/uL (ref 0.7–4.0)
MCHC: 33.7 g/dL (ref 30.0–36.0)
MCV: 106 fl — ABNORMAL HIGH (ref 78.0–100.0)
MONOS PCT: 8.1 % (ref 3.0–12.0)
Monocytes Absolute: 0.5 10*3/uL (ref 0.1–1.0)
NEUTROS ABS: 3 10*3/uL (ref 1.4–7.7)
Neutrophils Relative %: 54.1 % (ref 43.0–77.0)
Platelets: 222 10*3/uL (ref 150.0–400.0)
RBC: 4.07 Mil/uL (ref 3.87–5.11)
RDW: 12.3 % (ref 11.5–15.5)
WBC: 5.6 10*3/uL (ref 4.0–10.5)

## 2014-03-02 LAB — BASIC METABOLIC PANEL
BUN: 11 mg/dL (ref 6–23)
CALCIUM: 9.1 mg/dL (ref 8.4–10.5)
CO2: 29 mEq/L (ref 19–32)
Chloride: 104 mEq/L (ref 96–112)
Creatinine, Ser: 0.6 mg/dL (ref 0.4–1.2)
GFR: 107.96 mL/min (ref >60.00)
Glucose, Bld: 90 mg/dL (ref 70–99)
Potassium: 4.1 mEq/L (ref 3.5–5.1)
Sodium: 138 mEq/L (ref 135–145)

## 2014-03-02 LAB — POCT URINALYSIS DIPSTICK
BILIRUBIN UA: NEGATIVE
Glucose, UA: NEGATIVE
Ketones, UA: NEGATIVE
Leukocytes, UA: NEGATIVE
Nitrite, UA: NEGATIVE
Protein, UA: NEGATIVE
RBC UA: NEGATIVE
SPEC GRAV UA: 1.015
UROBILINOGEN UA: 0.2
pH, UA: 8.5

## 2014-03-02 LAB — LIPID PANEL
CHOLESTEROL: 173 mg/dL (ref 0–200)
HDL: 81.1 mg/dL (ref >39.00)
LDL CALC: 77 mg/dL (ref 0–99)
NonHDL: 91.9
Total CHOL/HDL Ratio: 2
Triglycerides: 77 mg/dL (ref 0.0–149.0)
VLDL: 15.4 mg/dL (ref 0.0–40.0)

## 2014-03-02 LAB — HEPATIC FUNCTION PANEL
ALBUMIN: 4 g/dL (ref 3.5–5.2)
ALK PHOS: 86 U/L (ref 39–117)
ALT: 20 U/L (ref 0–35)
AST: 22 U/L (ref 0–37)
Bilirubin, Direct: 0.1 mg/dL (ref 0.0–0.3)
Total Bilirubin: 1 mg/dL (ref 0.2–1.2)
Total Protein: 6.7 g/dL (ref 6.0–8.3)

## 2014-03-02 LAB — TSH: TSH: 0.51 u[IU]/mL (ref 0.35–4.50)

## 2014-03-08 ENCOUNTER — Ambulatory Visit (INDEPENDENT_AMBULATORY_CARE_PROVIDER_SITE_OTHER): Payer: 59 | Admitting: Family Medicine

## 2014-03-08 ENCOUNTER — Encounter: Payer: Self-pay | Admitting: Family Medicine

## 2014-03-08 VITALS — BP 168/96 | HR 78 | Temp 99.2°F | Ht 65.25 in | Wt 225.0 lb

## 2014-03-08 DIAGNOSIS — I10 Essential (primary) hypertension: Secondary | ICD-10-CM

## 2014-03-08 DIAGNOSIS — Z0000 Encounter for general adult medical examination without abnormal findings: Secondary | ICD-10-CM

## 2014-03-08 MED ORDER — RALOXIFENE HCL 60 MG PO TABS: 60.0000 mg | ORAL_TABLET | Freq: Every day | ORAL | Status: DC

## 2014-03-08 MED ORDER — BENZONATATE 200 MG PO CAPS: 200.0000 mg | ORAL_CAPSULE | Freq: Three times a day (TID) | ORAL | Status: DC | PRN

## 2014-03-08 MED ORDER — ACYCLOVIR 400 MG PO TABS: 400.0000 mg | ORAL_TABLET | ORAL | Status: DC | PRN

## 2014-03-08 MED ORDER — FLUCONAZOLE 150 MG PO TABS: 150.0000 mg | ORAL_TABLET | Freq: Once | ORAL | Status: DC

## 2014-03-08 MED ORDER — AMOXICILLIN-POT CLAVULANATE 875-125 MG PO TABS: 1.0000 | ORAL_TABLET | Freq: Two times a day (BID) | ORAL | Status: DC

## 2014-03-08 MED ORDER — MECLIZINE HCL 25 MG PO TABS: 25.0000 mg | ORAL_TABLET | ORAL | Status: DC | PRN

## 2014-03-08 NOTE — Progress Notes (Signed)
   Subjective:    Patient ID: Armond HangKathy K Honse, female    DOB: [birth year], 63 y.o.   MRN: [id number]  HPI 63 yr old female for a cpx. She had been doing well in general until one week ago when she developed sinus pressure, PND, and a dry cough. No fever.    Review of Systems  Constitutional: Negative.   HENT: Positive for congestion, postnasal drip and sinus pressure. Negative for ear discharge, ear pain, facial swelling, mouth sores and nosebleeds.   Eyes: Negative.   Respiratory: Positive for cough. Negative for apnea, chest tightness, shortness of breath and wheezing.   Cardiovascular: Negative.   Gastrointestinal: Negative.   Genitourinary: Negative for dysuria, urgency, frequency, hematuria, flank pain, decreased urine volume, enuresis, difficulty urinating, pelvic pain and dyspareunia.  Musculoskeletal: Negative.   Skin: Negative.   Neurological: Negative.   Psychiatric/Behavioral: Negative.        Objective:   Physical Exam  Constitutional: She is oriented to person, place, and time. She appears well-developed and well-nourished. No distress.  HENT:  Head: Normocephalic and atraumatic.  Right Ear: External ear normal.  Left Ear: External ear normal.  Nose: Nose normal.  Mouth/Throat: Oropharynx is clear and moist. No oropharyngeal exudate.  Eyes: Conjunctivae and EOM are normal. Pupils are equal, round, and reactive to light. No scleral icterus.  Neck: Normal range of motion. Neck supple. No JVD present. No thyromegaly present.  Cardiovascular: Normal rate, regular rhythm, normal heart sounds and intact distal pulses.  Exam reveals no gallop and no friction rub.   No murmur heard. EKG normal   Pulmonary/Chest: Effort normal and breath sounds normal. No respiratory distress. She has no wheezes. She has no rales. She exhibits no tenderness.  Abdominal: Soft. Bowel sounds are normal. She exhibits no distension and no mass. There is no tenderness. There is no rebound and no  guarding.  Musculoskeletal: Normal range of motion. She exhibits no edema or tenderness.  Lymphadenopathy:    She has no cervical adenopathy.  Neurological: She is alert and oriented to person, place, and time. She has normal reflexes. No cranial nerve deficit. She exhibits normal muscle tone. Coordination normal.  Skin: Skin is warm and dry. No rash noted. No erythema.  Psychiatric: She has a normal mood and affect. Her behavior is normal. Judgment and thought content normal.          Assessment & Plan:  Well exam. We also treated her sinusitis as below.

## 2014-03-08 NOTE — Progress Notes (Signed)
Pre visit review using our clinic review tool, if applicable. No additional management support is needed unless otherwise documented below in the visit note. 

## 2014-03-09 ENCOUNTER — Telehealth: Payer: Self-pay | Admitting: Family Medicine

## 2014-03-09 NOTE — Telephone Encounter (Signed)
emmi emailed °

## 2014-03-12 ENCOUNTER — Encounter: Payer: Self-pay | Admitting: Family Medicine

## 2014-03-12 MED ORDER — AZITHROMYCIN 250 MG PO TABS: ORAL_TABLET | ORAL | Status: DC

## 2014-03-12 NOTE — Telephone Encounter (Signed)
Per Dr. Clent RidgesFry, change from Medrol dose pack to a Z-pack. Pt does not like taking prednisone, it makes her feel agitated, she does not want this added to allergy list. I sent script for Z pack and spoke with pt.

## 2014-03-12 NOTE — Telephone Encounter (Signed)
We noted in her chart that she is allergic to Augmentin. Tell her to take Benadryl 50 mg every 4 hours prn, also Zantac 150 mg bid until the hives are gone. Call in a Medrol dose pack also

## 2014-03-29 ENCOUNTER — Other Ambulatory Visit: Payer: Self-pay | Admitting: Family Medicine

## 2014-04-06 ENCOUNTER — Encounter: Payer: Self-pay | Admitting: Family Medicine

## 2014-04-06 NOTE — Telephone Encounter (Signed)
This sounds like a yeast infection. Try OTC Monistat cream prn

## 2014-05-08 ENCOUNTER — Encounter: Payer: Self-pay | Admitting: Family Medicine

## 2014-05-23 ENCOUNTER — Encounter: Payer: Self-pay | Admitting: Family

## 2014-05-23 ENCOUNTER — Other Ambulatory Visit (HOSPITAL_COMMUNITY)
Admission: RE | Admit: 2014-05-23 | Discharge: 2014-05-23 | Disposition: A | Discharge status: Home / Self Care | Payer: 59 | Source: Ambulatory Visit | Attending: Family | Admitting: Family

## 2014-05-23 ENCOUNTER — Ambulatory Visit (INDEPENDENT_AMBULATORY_CARE_PROVIDER_SITE_OTHER): Payer: 59 | Admitting: Family

## 2014-05-23 VITALS — BP 152/80 | Temp 98.7°F | Wt 232.1 lb

## 2014-05-23 DIAGNOSIS — Z01419 Encounter for gynecological examination (general) (routine) without abnormal findings: Secondary | ICD-10-CM | POA: Diagnosis not present

## 2014-05-23 DIAGNOSIS — Z1272 Encounter for screening for malignant neoplasm of vagina: Secondary | ICD-10-CM

## 2014-05-23 NOTE — Progress Notes (Signed)
Subjective:    Patient ID: Lori Durham, female    DOB: 18-Dec-1950, 64 y.o.   MRN: [id number]  HPI 64 year old white female with a history of a hysterectomy is in today for Pap smear and pelvic exam. Denies any concerns.   Review of Systems  Constitutional: Negative.   Respiratory: Negative.   Cardiovascular: Negative.   Endocrine: Negative.   Genitourinary: Negative.   Musculoskeletal: Negative.   Skin: Negative.   Psychiatric/Behavioral: Negative.    Past Medical History  Diagnosis Date  . Weight gain   . Hypertension   . Hyperlipidemia   . Low back pain     Dr Jeral Fruit  . Varicose veins of both lower extremities with pain   . Asthma     mild, intermittent  . Hypothyroidism     postsurgical- sees Dr Everardo All  . GERD (gastroesophageal reflux disease)   . Vitamin D deficiency   . Obesity     hx of hyperglycemia before surgery.  . Colon polyps   . S/P tonsillectomy and adenoidectomy   . Osteoporosis     sees Dr. Elvera Lennox     History   Social History  . Marital Status: Married    Spouse Name: N/A  . Number of Children: N/A  . Years of Education: N/A   Occupational History  . Not on file.   Social History Main Topics  . Smoking status: Never Smoker   . Smokeless tobacco: Never Used  . Alcohol Use: 4.2 oz/week    7 Glasses of wine per week     Comment: glass of wine each day  . Drug Use: No  . Sexual Activity:    Partners: Male   Other Topics Concern  . Not on file   Social History Narrative    Past Surgical History  Procedure Laterality Date  . 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 Abdominal hysterectomy      Partial w/ ovaries intact  . 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 Venoablation    . Cholecystectomy, laparoscopic  NOV 2012    [facility]  . Colonoscopy  12-07-11    per Dr. Noe Gens at [facility], clear, repeat in 5 yrs     Family History  Problem Relation Age of  Onset  . Hypothyroidism Mother   . Heart disease Mother   . Hypertension Mother   . Alcohol abuse Father   . Arthritis Other     family hx of  . Coronary artery disease Other     family history of  . Hyperlipidemia Other     family history of  . Hypertension Other     family history of  . Heart disease Maternal Grandmother   . Alcohol abuse Paternal Grandfather   . Alcohol abuse Brother   . Heart disease Brother   . Heart attack Brother 42  . Alcohol abuse Brother   . Osteoporosis Brother   . Other Brother     varicose veins  . Osteoporosis Brother     Allergies  Allergen Reactions  . Augmentin [Amoxicillin-Pot Clavulanate] Hives  . Contrast Media [Iodinated Diagnostic Agents] Hives  . Hydrocodone     ( cough syrup ) light headed    Current Outpatient Prescriptions on File Prior to Visit  Medication Sig Dispense Refill  . acyclovir (ZOVIRAX) 400 MG tablet Take 1 tablet (400 mg total)  by mouth as needed. 100 tablet 3  . ALPRAZolam (XANAX) 0.25 MG tablet Take 1 tablet (0.25 mg total) by mouth 3 (three) times daily as needed for anxiety. 90 tablet 5  . amoxicillin-clavulanate (AUGMENTIN) 875-125 MG per tablet Take 1 tablet by mouth 2 (two) times daily. 20 tablet 0  . azithromycin (ZITHROMAX Z-PAK) 250 MG tablet Take as directed 6 each 0  . benzonatate (TESSALON) 200 MG capsule Take 1 capsule (200 mg total) by mouth 3 (three) times daily as needed for cough. 60 capsule 0  . Calcium Carbonate-Vitamin D (CALCIUM-D PO) Take by mouth daily.      . diclofenac sodium (VOLTAREN) 1 % GEL Apply 4 g topically 4 (four) times daily. 300 g 3  . fish oil-omega-3 fatty acids 1000 MG capsule Take 2 g by mouth daily.    . fluconazole (DIFLUCAN) 150 MG tablet Take 1 tablet (150 mg total) by mouth once. 1 tablet 11  . Glucosamine-Chondroitin 1500-1200 MG/30ML LIQD Take by mouth daily.      Marland Kitchen. liothyronine (CYTOMEL) 5 MCG tablet Take 1 tablet (5 mcg total) by mouth daily. 90 tablet 3  .  meclizine (ANTIVERT) 25 MG tablet Take 1 tablet (25 mg total) by mouth every 4 (four) hours as needed for dizziness. 100 tablet 3  . Multiple Vitamin-Folic Acid TABS Take 1 tablet by mouth daily.      . predniSONE (STERAPRED UNI-PAK) 10 MG tablet Take 4 tabs a day for 4 days, then 3 a day for 4 days, then 2 a day for 4 days, then 1 a day for 4 days, then stop. 40 tablet 0  . raloxifene (EVISTA) 60 MG tablet Take 1 tablet (60 mg total) by mouth daily. 90 tablet 3  . SYNTHROID 125 MCG tablet Take 1 tablet (125 mcg total) by mouth daily. 30 tablet 11  . zinc gluconate 50 MG tablet Take 50 mg by mouth daily.       No current facility-administered medications on file prior to visit.    BP 152/80 mmHg  Temp(Src) 98.7 F (37.1 C) (Oral)  Wt 232 lb 1.6 oz (105.28 kg)chart    Objective:   Physical Exam  Constitutional: She is oriented to person, place, and time. She appears well-developed and well-nourished.  Neck: Normal range of motion. Neck supple.  Cardiovascular: Normal rate, regular rhythm and normal heart sounds.   Pulmonary/Chest: Effort normal and breath sounds normal.  Abdominal: Soft. Bowel sounds are normal.  Genitourinary: Vagina normal. Guaiac negative stool. No vaginal discharge found.  Musculoskeletal: Normal range of motion.  Neurological: She is alert and oriented to person, place, and time.  Skin: Skin is warm and dry.  Psychiatric: She has a normal mood and affect.          Assessment & Plan:  Lori Durham was seen today for gynecologic exam.  Diagnoses and all orders for this visit:  Special screening for malignant neoplasms, vagina Orders: -     PAP [[place]]   Normal pelvic exam. Pap smear sent. Will notify patient pending results. Encouraged healthy diet, exercise, monthly self breast exams.

## 2014-05-23 NOTE — Patient Instructions (Signed)

## 2014-05-23 NOTE — Progress Notes (Signed)
Pre visit review using our clinic review tool, if applicable. No additional management support is needed unless otherwise documented below in the visit note. 

## 2014-05-28 LAB — CYTOLOGY - PAP

## 2014-05-29 ENCOUNTER — Telehealth: Payer: Self-pay | Admitting: Family Medicine

## 2014-05-29 ENCOUNTER — Encounter: Payer: Self-pay | Admitting: Family

## 2014-05-29 NOTE — Telephone Encounter (Signed)
I will call pt once [facility]

## 2014-05-29 NOTE — Telephone Encounter (Signed)
Pt needs papsmear results

## 2014-05-30 ENCOUNTER — Encounter: Payer: Self-pay | Admitting: Family

## 2014-07-05 ENCOUNTER — Encounter: Payer: Self-pay | Admitting: Family Medicine

## 2014-07-06 ENCOUNTER — Encounter: Payer: Self-pay | Admitting: Family Medicine

## 2014-07-06 NOTE — Telephone Encounter (Signed)
There is not much I can do without examining the knee. Use ice and call in refills for Diclofenac 75 mg to take bid prn pain, #60 with 5 rf

## 2014-07-06 NOTE — Telephone Encounter (Signed)
Scheduled appt at [facility] to see you sat.  However pt concerned about taking too much nsaids due to previous sleeve gaslectomy surgery.  Would like to know what to do between now and tomorrow for pain.

## 2014-07-06 NOTE — Telephone Encounter (Signed)
I spoke with pt and she will see Dr. Clent RidgesFry at the Saturday clinic on 07/07/14. I did offer to send in the Diclofenac and she declined at this time. She has already been taking it and doesn't seem to be helping much.

## 2014-07-07 ENCOUNTER — Ambulatory Visit (INDEPENDENT_AMBULATORY_CARE_PROVIDER_SITE_OTHER): Payer: 59 | Admitting: Family Medicine

## 2014-07-07 ENCOUNTER — Encounter: Payer: Self-pay | Admitting: Family Medicine

## 2014-07-07 VITALS — BP 140/90 | Temp 99.7°F | Wt 227.0 lb

## 2014-07-07 DIAGNOSIS — M25561 Pain in right knee: Secondary | ICD-10-CM | POA: Diagnosis not present

## 2014-07-07 MED ORDER — TRAMADOL HCL 50 MG PO TABS: 100.0000 mg | ORAL_TABLET | Freq: Four times a day (QID) | ORAL | Status: DC | PRN

## 2014-07-07 NOTE — Progress Notes (Signed)
   Subjective:    Patient ID: Lori Durham, female    DOB: Jul 04, 1950, 64 y.o.   MRN: [id number]  HPI Here for 4 weeks of pain in the right knee. The pain started the morning after she had spent several hours crawling around on her knees to scrub the floors of her house. The right knee swelled a little and had some pain. She took some Diclofenac and used Voltaren gel on the right knee, and it improved quite a bit. Then 4 days ago while carrying heavy grocery bags up some steps from her garage into her house, she felt a sudden resurgence of pain in the right knee. This has been swollen and painful ever since. Using ice and some Diclofenac, but this has not been helping the pain. Wrapping the knee with an Ace wrap.    Review of Systems  Constitutional: Negative.   Musculoskeletal: Positive for joint swelling, arthralgias and gait problem. Negative for back pain.       Objective:   Physical Exam  Constitutional:  In pain, limping   Musculoskeletal:  The right knee is swollen and has very reduced ROM due to pain. It is very tender in the medial and lateral joint spaces. Anterior drawer is negative but Mc[place] is positive.           Assessment & Plan:  She has probably torn some cartilage in the knee. Rest, ice, wear a Neoprene support sleeve. Use Tramadol for pain. Refer to see Orthopedics

## 2014-07-07 NOTE — Progress Notes (Signed)
Pre visit review using our clinic review tool, if applicable. No additional management support is needed unless otherwise documented below in the visit note. 

## 2014-07-18 ENCOUNTER — Encounter: Payer: Self-pay | Admitting: Family Medicine

## 2014-07-18 NOTE — Telephone Encounter (Signed)
Please tell her I am sorry but there is nothing I can do at this point. If I order an MRI scan, her insurance may not cover it since she is under the care of a specialist. Even if I did, it would only possibly confirm a cartilage tear. This would not change the treatment at all, unless Dr. Charlann Boxerlin was considering surgery. That is a decision only he can make

## 2014-08-06 ENCOUNTER — Encounter (HOSPITAL_BASED_OUTPATIENT_CLINIC_OR_DEPARTMENT_OTHER): Payer: Self-pay | Admitting: *Deleted

## 2014-08-06 NOTE — Progress Notes (Signed)
NPO AFTER MN WITH EXCEPTION CLEAR LIQUIDS UNTIL 0730 (NO CREAM/MILK PRODUCTS).  ARRIVE AT 1130. NEEDS HG.  CURRENT EKG IN CHART AND EPIC. WILL TAKE SYNTHROID AND CYTOMEL AM DOS W/ SIPS OF WATER.

## 2014-08-10 ENCOUNTER — Ambulatory Visit (HOSPITAL_BASED_OUTPATIENT_CLINIC_OR_DEPARTMENT_OTHER): Payer: 59 | Admitting: Anesthesiology

## 2014-08-10 ENCOUNTER — Ambulatory Visit (HOSPITAL_BASED_OUTPATIENT_CLINIC_OR_DEPARTMENT_OTHER)
Admission: RE | Admit: 2014-08-10 | Discharge: 2014-08-10 | Disposition: A | Discharge status: Home Health | Payer: 59 | Source: Ambulatory Visit | Attending: Orthopedic Surgery | Admitting: Orthopedic Surgery

## 2014-08-10 ENCOUNTER — Encounter (HOSPITAL_BASED_OUTPATIENT_CLINIC_OR_DEPARTMENT_OTHER): Payer: Self-pay | Admitting: *Deleted

## 2014-08-10 ENCOUNTER — Encounter (HOSPITAL_BASED_OUTPATIENT_CLINIC_OR_DEPARTMENT_OTHER)
Admission: RE | Disposition: A | Discharge status: Home Health | Payer: Self-pay | Source: Ambulatory Visit | Attending: Orthopedic Surgery

## 2014-08-10 DIAGNOSIS — S83241A Other tear of medial meniscus, current injury, right knee, initial encounter: Secondary | ICD-10-CM | POA: Insufficient documentation

## 2014-08-10 DIAGNOSIS — J45909 Unspecified asthma, uncomplicated: Secondary | ICD-10-CM | POA: Diagnosis not present

## 2014-08-10 DIAGNOSIS — M659 Synovitis and tenosynovitis, unspecified: Secondary | ICD-10-CM | POA: Insufficient documentation

## 2014-08-10 DIAGNOSIS — M94261 Chondromalacia, right knee: Secondary | ICD-10-CM | POA: Insufficient documentation

## 2014-08-10 DIAGNOSIS — M810 Age-related osteoporosis without current pathological fracture: Secondary | ICD-10-CM | POA: Insufficient documentation

## 2014-08-10 DIAGNOSIS — E559 Vitamin D deficiency, unspecified: Secondary | ICD-10-CM | POA: Diagnosis not present

## 2014-08-10 DIAGNOSIS — I8393 Asymptomatic varicose veins of bilateral lower extremities: Secondary | ICD-10-CM | POA: Insufficient documentation

## 2014-08-10 DIAGNOSIS — Z9884 Bariatric surgery status: Secondary | ICD-10-CM | POA: Insufficient documentation

## 2014-08-10 DIAGNOSIS — G4733 Obstructive sleep apnea (adult) (pediatric): Secondary | ICD-10-CM | POA: Insufficient documentation

## 2014-08-10 DIAGNOSIS — Z79899 Other long term (current) drug therapy: Secondary | ICD-10-CM | POA: Insufficient documentation

## 2014-08-10 DIAGNOSIS — Z86010 Personal history of colonic polyps: Secondary | ICD-10-CM | POA: Insufficient documentation

## 2014-08-10 DIAGNOSIS — X58XXXA Exposure to other specified factors, initial encounter: Secondary | ICD-10-CM | POA: Insufficient documentation

## 2014-08-10 DIAGNOSIS — K219 Gastro-esophageal reflux disease without esophagitis: Secondary | ICD-10-CM | POA: Diagnosis not present

## 2014-08-10 DIAGNOSIS — E039 Hypothyroidism, unspecified: Secondary | ICD-10-CM | POA: Insufficient documentation

## 2014-08-10 DIAGNOSIS — Y929 Unspecified place or not applicable: Secondary | ICD-10-CM | POA: Diagnosis not present

## 2014-08-10 DIAGNOSIS — E785 Hyperlipidemia, unspecified: Secondary | ICD-10-CM | POA: Diagnosis not present

## 2014-08-10 HISTORY — DX: Personal history of other diseases of the circulatory system: Z86.79

## 2014-08-10 HISTORY — DX: Presence of spectacles and contact lenses: Z97.3

## 2014-08-10 HISTORY — PX: KNEE ARTHROSCOPY: SHX127

## 2014-08-10 HISTORY — DX: Obstructive sleep apnea (adult) (pediatric): G47.33

## 2014-08-10 HISTORY — DX: Postprocedural hypothyroidism: E89.0

## 2014-08-10 HISTORY — DX: Other specified postprocedural states: Z98.890

## 2014-08-10 HISTORY — DX: Other specified postprocedural states: R11.2

## 2014-08-10 HISTORY — DX: Mild intermittent asthma, uncomplicated: J45.20

## 2014-08-10 HISTORY — DX: Unspecified tear of unspecified meniscus, current injury, right knee, initial encounter: S83.206A

## 2014-08-10 HISTORY — DX: Personal history of colon polyps, unspecified: Z86.0100

## 2014-08-10 HISTORY — DX: Personal history of colonic polyps: Z86.010

## 2014-08-10 HISTORY — DX: Personal history of other endocrine, nutritional and metabolic disease: Z86.39

## 2014-08-10 HISTORY — DX: Personal history of other medical treatment: Z92.89

## 2014-08-10 LAB — POCT HEMOGLOBIN-HEMACUE: Hemoglobin: 14.6 g/dL (ref 12.0–15.0)

## 2014-08-10 SURGERY — ARTHROSCOPY, KNEE
Anesthesia: General | Site: Knee | Laterality: Right

## 2014-08-10 MED ORDER — FENTANYL CITRATE (PF) 100 MCG/2ML IJ SOLN
25.0000 ug | INTRAMUSCULAR | Status: DC | PRN
  Filled 2014-08-10: qty 1

## 2014-08-10 MED ORDER — SCOPOLAMINE 1 MG/3DAYS TD PT72
MEDICATED_PATCH | TRANSDERMAL | Status: AC
  Filled 2014-08-10: qty 1

## 2014-08-10 MED ORDER — TRAMADOL-ACETAMINOPHEN 37.5-325 MG PO TABS: 1.0000 | ORAL_TABLET | Freq: Four times a day (QID) | ORAL | Status: DC | PRN

## 2014-08-10 MED ORDER — PROMETHAZINE HCL 25 MG/ML IJ SOLN
6.2500 mg | INTRAMUSCULAR | Status: DC | PRN
  Filled 2014-08-10: qty 1

## 2014-08-10 MED ORDER — LIDOCAINE-EPINEPHRINE (PF) 1 %-1:200000 IJ SOLN
INTRAMUSCULAR | Status: DC | PRN
  Administered 2014-08-10: 30 mL

## 2014-08-10 MED ORDER — SCOPOLAMINE 1 MG/3DAYS TD PT72
MEDICATED_PATCH | TRANSDERMAL | Status: DC | PRN
  Administered 2014-08-10: 1 via TRANSDERMAL

## 2014-08-10 MED ORDER — ACETAMINOPHEN 10 MG/ML IV SOLN
INTRAVENOUS | Status: DC | PRN
  Administered 2014-08-10: 1000 mg via INTRAVENOUS

## 2014-08-10 MED ORDER — MIDAZOLAM HCL 2 MG/2ML IJ SOLN
INTRAMUSCULAR | Status: AC
  Filled 2014-08-10: qty 2

## 2014-08-10 MED ORDER — CLINDAMYCIN PHOSPHATE 900 MG/50ML IV SOLN
900.0000 mg | INTRAVENOUS | Status: AC
  Administered 2014-08-10: 900 mg via INTRAVENOUS
  Filled 2014-08-10: qty 50

## 2014-08-10 MED ORDER — LACTATED RINGERS IV SOLN
INTRAVENOUS | Status: DC
  Administered 2014-08-10 (×2): via INTRAVENOUS
  Filled 2014-08-10: qty 1000

## 2014-08-10 MED ORDER — SODIUM CHLORIDE 0.9 % IR SOLN
Status: DC | PRN
  Administered 2014-08-10: 6000 mL
  Administered 2014-08-10: 3000 mL

## 2014-08-10 MED ORDER — STERILE WATER FOR IRRIGATION IR SOLN
Status: DC | PRN
  Administered 2014-08-10: 500 mL

## 2014-08-10 MED ORDER — LIDOCAINE HCL (CARDIAC) 20 MG/ML IV SOLN
INTRAVENOUS | Status: DC | PRN
  Administered 2014-08-10: 50 mg via INTRAVENOUS

## 2014-08-10 MED ORDER — CLINDAMYCIN PHOSPHATE 900 MG/50ML IV SOLN
INTRAVENOUS | Status: AC
  Filled 2014-08-10: qty 50

## 2014-08-10 MED ORDER — ONDANSETRON HCL 4 MG/2ML IJ SOLN
INTRAMUSCULAR | Status: DC | PRN
  Administered 2014-08-10: 4 mg via INTRAVENOUS

## 2014-08-10 MED ORDER — FENTANYL CITRATE (PF) 100 MCG/2ML IJ SOLN
INTRAMUSCULAR | Status: DC | PRN
  Administered 2014-08-10 (×2): 25 ug via INTRAVENOUS
  Administered 2014-08-10: 50 ug via INTRAVENOUS

## 2014-08-10 MED ORDER — FENTANYL CITRATE (PF) 100 MCG/2ML IJ SOLN
INTRAMUSCULAR | Status: AC
  Filled 2014-08-10: qty 4

## 2014-08-10 MED ORDER — KETOROLAC TROMETHAMINE 30 MG/ML IJ SOLN
INTRAMUSCULAR | Status: DC | PRN
  Administered 2014-08-10: 30 mg via INTRAVENOUS

## 2014-08-10 MED ORDER — KETOROLAC TROMETHAMINE 30 MG/ML IJ SOLN
INTRAMUSCULAR | Status: AC
  Filled 2014-08-10: qty 1

## 2014-08-10 MED ORDER — MIDAZOLAM HCL 5 MG/5ML IJ SOLN
INTRAMUSCULAR | Status: DC | PRN
  Administered 2014-08-10: 2 mg via INTRAVENOUS

## 2014-08-10 MED ORDER — BUPIVACAINE-EPINEPHRINE 0.25% -1:200000 IJ SOLN
INTRAMUSCULAR | Status: DC | PRN
  Administered 2014-08-10: 30 mL

## 2014-08-10 MED ORDER — PROPOFOL 10 MG/ML IV BOLUS
INTRAVENOUS | Status: DC | PRN
  Administered 2014-08-10: 200 mg via INTRAVENOUS

## 2014-08-10 MED ORDER — DEXAMETHASONE SODIUM PHOSPHATE 4 MG/ML IJ SOLN
INTRAMUSCULAR | Status: DC | PRN
  Administered 2014-08-10: 10 mg via INTRAVENOUS

## 2014-08-10 SURGICAL SUPPLY — 27 items
BANDAGE ELASTIC 6 VELCRO ST LF (GAUZE/BANDAGES/DRESSINGS) ×2 IMPLANT
BLADE CUDA SHAVER 3.5 (BLADE) ×2 IMPLANT
CANISTER SUCT LVC 12 LTR MEDI- (MISCELLANEOUS) ×2 IMPLANT
CANISTER SUCTION 2500CC (MISCELLANEOUS) ×2 IMPLANT
DRAPE ARTHROSCOPY W/POUCH 114 (DRAPES) ×2 IMPLANT
DRSG EMULSION OIL 3X3 NADH (GAUZE/BANDAGES/DRESSINGS) ×2 IMPLANT
DRSG PAD ABDOMINAL 8X10 ST (GAUZE/BANDAGES/DRESSINGS) ×2 IMPLANT
DURAPREP 26ML APPLICATOR (WOUND CARE) ×2 IMPLANT
GLOVE BIO SURGEON STRL SZ7.5 (GLOVE) ×2 IMPLANT
GLOVE BIOGEL PI IND STRL 7.5 (GLOVE) ×1 IMPLANT
GLOVE BIOGEL PI INDICATOR 7.5 (GLOVE) ×1
GLOVE SURG SS PI 7.5 STRL IVOR (GLOVE) ×2 IMPLANT
GOWN STRL REUS W/ TWL LRG LVL3 (GOWN DISPOSABLE) ×1 IMPLANT
GOWN STRL REUS W/TWL LRG LVL3 (GOWN DISPOSABLE) ×1
IV NS IRRIG 3000ML ARTHROMATIC (IV SOLUTION) ×6 IMPLANT
KNEE WRAP E Z 3 GEL PACK (MISCELLANEOUS) ×2 IMPLANT
PACK ARTHROSCOPY DSU (CUSTOM PROCEDURE TRAY) ×2 IMPLANT
PACK BASIN DAY SURGERY FS (CUSTOM PROCEDURE TRAY) ×2 IMPLANT
SET ARTHROSCOPY TUBING (MISCELLANEOUS) ×1
SET ARTHROSCOPY TUBING LN (MISCELLANEOUS) ×1 IMPLANT
SPONGE GAUZE 4X4 12PLY STER LF (GAUZE/BANDAGES/DRESSINGS) ×2 IMPLANT
SUT ETHILON 4 0 PS 2 18 (SUTURE) ×2 IMPLANT
SYR 30ML LL (SYRINGE) ×2 IMPLANT
SYR CONTROL 10ML LL (SYRINGE) ×2 IMPLANT
TOWEL OR 17X24 6PK STRL BLUE (TOWEL DISPOSABLE) ×2 IMPLANT
TUBE CONNECTING 12X1/4 (SUCTIONS) ×2 IMPLANT
WATER STERILE IRR 500ML POUR (IV SOLUTION) ×2 IMPLANT

## 2014-08-10 NOTE — Anesthesia Postprocedure Evaluation (Addendum)
  Anesthesia Post-op Note  Patient: Armond HangKathy K Glauber  Procedure(s) Performed: Procedure(s) (LRB): ARTHROSCOPY RIGHT KNEE WITH DEBRIDEMENT (Right)  Patient Location: PACU  Anesthesia Type: General  Level of Consciousness: awake and alert   Airway and Oxygen Therapy: Patient Spontanous Breathing  Post-op Pain: mild  Post-op Assessment: Post-op Vital signs reviewed, Patient's Cardiovascular Status Stable, Respiratory Function Stable, Patent Airway and No signs of Nausea or vomiting  Last Vitals:  Filed Vitals:   08/10/14 1441  BP: 153/66  Pulse: 81  Temp: 36.8 C  Resp: 15    Post-op Vital Signs: stable   Complications: No apparent anesthesia complications. No nausea noted. Patient reminded to wash hands after removing scopolamine patch tomorrow.

## 2014-08-10 NOTE — H&P (Signed)
CC- Lori Durham is a 64 y.o. female who presents with right knee pain.  HPI- . Knee Pain: Patient presents for follow up on a knee problem involving the  right knee. Onset of the symptoms was several months ago. Inciting event: this is a longstanding problem which has been getting worse. Current symptoms include crepitus sensation, pain located medially and patellofemoral areas of the knee and swelling. Pain is aggravated by going up and down stairs, lateral movements and walking.  Patient has had no prior knee problems. Evaluation to date: plain films: abnormal with slight narrowing in the patellofemoral compartment and even less medially and MRI: abnormal with medial meniscal tear and patellofemoral degenerative changes. Treatment to date: avoidance of offending activity, corticosteroid injection which was somewhat effective, OTC analgesics which are not very effective and prescription NSAIDS which are somewhat effective.  Past Medical History  Diagnosis Date  . Hyperlipidemia   . Varicose veins of both lower extremities with pain   . Vitamin D deficiency   . Osteoporosis     sees Dr. Elvera LennoxGherghe   . History of colon polyps   . Asthma, mild intermittent   . Hypothyroidism, postsurgical   . History of exercise stress test     ETT--  06-17-2007--  NORMAL  . Right knee meniscal tear   . OSA (obstructive sleep apnea)     MODERATE OSA PER STUDY 09-25-2009--- no longer uses cpap,  no issue since gastric sleeve surgery and lost wt.  . History of hypertension     no issue since gastric sleeve surgery and lost wt.  . History of diabetes mellitus     no issue since gastric sleeve surgery and lost wt.  Marland Kitchen. PONV (postoperative nausea and vomiting)   . GERD (gastroesophageal reflux disease)     watches diet  . Wears glasses     Past Surgical History  Procedure Laterality Date  . Colonoscopy  last one 12-07-2011  . Thyroid lobectomy Left 1991    benign  . Laparoscopic gastric sleeve resection   11-15-2009   [place]  . Laparoscopic cholecystectomy  Nov 2012  [place]  . Tonsillectomy and adenoidectomy  age 89  . Vaginal hysterectomy  1995    w/ ovaries intact    Prior to Admission medications   Medication Sig Start Date End Date Taking? Authorizing Provider  ALPRAZolam (XANAX) 0.25 MG tablet Take 1 tablet (0.25 mg total) by mouth 3 (three) times daily as needed for anxiety. 02/23/13  Yes Nelwyn SalisburyStephen A Fry, MD  BIOTIN PO Take 1 tablet by mouth daily.   Yes Historical Provider, MD  Calcium-Magnesium-Vitamin D 600-40-500 MG-MG-UNIT TB24 Take by mouth daily.   Yes Historical Provider, MD  Cholecalciferol (VITAMIN D3) 1000 UNITS CAPS Take 1 capsule by mouth daily.   Yes Historical Provider, MD  diclofenac sodium (VOLTAREN) 1 % GEL Apply 4 g topically 4 (four) times daily. 02/23/13  Yes Nelwyn SalisburyStephen A Fry, MD  diphenhydrAMINE (BENADRYL) 25 MG tablet Take 25 mg by mouth at bedtime.   Yes Historical Provider, MD  fish oil-omega-3 fatty acids 1000 MG capsule Take 2 g by mouth daily.   Yes Historical Provider, MD  Glucosamine-Chondroit-Vit C-Mn (GLUCOSAMINE 1500 COMPLEX PO) Take 2 tablets by mouth daily.   Yes Historical Provider, MD  liothyronine (CYTOMEL) 5 MCG tablet Take 1 tablet (5 mcg total) by mouth daily. Patient taking differently: Take 5 mcg by mouth every morning.  07/21/13  Yes Nelwyn SalisburyStephen A Fry, MD  Multiple Vitamin (  MULTIVITAMIN) tablet Take 1 tablet by mouth daily.   Yes Historical Provider, MD  Probiotic Product (ALIGN) 4 MG CAPS Take 1 capsule by mouth daily.   Yes Historical Provider, MD  raloxifene (EVISTA) 60 MG tablet Take 1 tablet (60 mg total) by mouth daily. 03/08/14  Yes Nelwyn SalisburyStephen A Fry, MD  SYNTHROID 125 MCG tablet Take 1 tablet (125 mcg total) by mouth daily. Patient taking differently: Take 125 mcg by mouth daily before breakfast.  12/20/13 12/20/14 Yes Nelwyn SalisburyStephen A Fry, MD  vitamin B-12 (CYANOCOBALAMIN) 1000 MCG tablet Take 1,000 mcg by mouth daily.   Yes Historical Provider, MD   Zinc 50 MG CAPS Take 1 capsule by mouth daily.   Yes Historical Provider, MD  acyclovir (ZOVIRAX) 400 MG tablet Take 1 tablet (400 mg total) by mouth as needed. 03/08/14   Nelwyn SalisburyStephen A Fry, MD  meclizine (ANTIVERT) 25 MG tablet Take 1 tablet (25 mg total) by mouth every 4 (four) hours as needed for dizziness. 03/08/14   Nelwyn SalisburyStephen A Fry, MD    antalgic gait, soft tissue tenderness over medial and patellofemoral compartments, effusion, reduced range of motion, normal ipsilateral hip exam, normal contralateral knee exam  Physical Examination: General appearance - alert, well appearing, and in no distress Mental status - alert, oriented to person, place, and time Chest - no tachypnea, retractions or cyanosis Heart - normal rate and regular rhythm Abdomen - soft, nontender, nondistended, no masses or organomegaly Musculoskeletal - see above knee exam  Asessment/Plan--- Right knee medial meniscal tear, patellofemoral chondromalacia   Plan RIGHT knee arthroscopy with meniscal debridement. Procedure risks and potential comps discussed with patient who elects to proceed. Goals are decreased pain and increased function with a high likelihood of achieving both

## 2014-08-10 NOTE — Anesthesia Procedure Notes (Signed)
Procedure Name: LMA Insertion Date/Time: 08/10/2014 2:01 PM Performed by: Norva PavlovALLAWAY, Vi Biddinger G Pre-anesthesia Checklist: Patient identified, Emergency Drugs available, Suction available and Patient being monitored Patient Re-evaluated:Patient Re-evaluated prior to inductionOxygen Delivery Method: Circle System Utilized Preoxygenation: Pre-oxygenation with 100% oxygen Intubation Type: IV induction Ventilation: Mask ventilation without difficulty LMA: LMA inserted LMA Size: 4.0 Number of attempts: 1 Airway Equipment and Method: bite block Placement Confirmation: positive ETCO2 Tube secured with: Tape Dental Injury: Teeth and Oropharynx as per pre-operative assessment

## 2014-08-10 NOTE — Transfer of Care (Signed)
  Last Vitals:  Filed Vitals:   08/10/14 1141  BP: 151/76  Pulse: 68  Temp: 36.6 C  Resp: 16    Immediate Anesthesia Transfer of Care Note  Patient: Lori Durham  Procedure(s) Performed: Procedure(s) (LRB): ARTHROSCOPY RIGHT KNEE WITH DEBRIDEMENT (Right)  Patient Location: PACU  Anesthesia Type: General  Level of Consciousness: awake, alert  and oriented  Airway & Oxygen Therapy: Patient Spontanous Breathing and Patient connected to nasal cannula oxygen  Post-op Assessment: Report given to PACU RN and Post -op Vital signs reviewed and stable  Post vital signs: Reviewed and stable  Complications: No apparent anesthesia complications

## 2014-08-10 NOTE — Anesthesia Preprocedure Evaluation (Addendum)
Anesthesia Evaluation  Patient identified by MRN, date of birth, ID band Patient awake    Reviewed: Allergy & Precautions, NPO status , Patient's Chart, lab work & pertinent test results  History of Anesthesia Complications (+) PONV and history of anesthetic complications  Airway Mallampati: II  TM Distance: >3 FB Neck ROM: Full    Dental no notable dental hx.    Pulmonary asthma , sleep apnea ,  OSA better since weight-loss surgery. She states she does not need CPAP now. breath sounds clear to auscultation  Pulmonary exam normal       Cardiovascular hypertension, + Peripheral Vascular Disease Normal cardiovascular examRhythm:Regular Rate:Normal     Neuro/Psych negative neurological ROS  negative psych ROS   GI/Hepatic Neg liver ROS, GERD-  ,  Endo/Other  Hypothyroidism   Renal/GU negative Renal ROS  negative genitourinary   Musculoskeletal negative musculoskeletal ROS (+)   Abdominal (+) + obese,   Peds negative pediatric ROS (+)  Hematology negative hematology ROS (+)   Anesthesia Other Findings   Reproductive/Obstetrics negative OB ROS                            Anesthesia Physical Anesthesia Plan  ASA: II  Anesthesia Plan: General   Post-op Pain Management:    Induction: Intravenous  Airway Management Planned: LMA  Additional Equipment:   Intra-op Plan:   Post-operative Plan: Extubation in OR  Informed Consent: I have reviewed the patients History and Physical, chart, labs and discussed the procedure including the risks, benefits and alternatives for the proposed anesthesia with the patient or authorized representative who has indicated his/her understanding and acceptance.   Dental advisory given  Plan Discussed with: CRNA  Anesthesia Plan Comments:         Anesthesia Quick Evaluation

## 2014-08-10 NOTE — Brief Op Note (Signed)
08/10/2014  1:42 PM  PATIENT:  Lori Durham  64 y.o. female  PRE-OPERATIVE DIAGNOSIS:  1.  right knee medial mensical tear, 2.  Right knee mild medial chondromalacia, 3.  Moderate patellofemoral chondrolmalacia  POST-OPERATIVE DIAGNOSIS:  1. Right knee MMT, 2. Right knee grade II-III chondromalacia, 3. synovitis  PROCEDURE:  Procedure(s): ARTHROSCOPY RIGHT KNEE WITH 1.  Medial partial meniscectomy(Right), 2. Right knee medial and patellofemoral chondroplasty, 3. Limited synovectomy  SURGEON:  Surgeon(s) and Role:    * Durene RomansMatthew Wallis Spizzirri, MD - Primary  PHYSICIAN ASSISTANT: None  ANESTHESIA:   general  EBL:   None  BLOOD ADMINISTERED:none  DRAINS: none   LOCAL MEDICATIONS USED:  MARCAINE     SPECIMEN:  No Specimen  DISPOSITION OF SPECIMEN:  N/A  COUNTS:  YES  TOURNIQUET:  * No tourniquets in log *  DICTATION: .Other Dictation: Dictation Number [id number]  PLAN OF CARE: Discharge to home after PACU  PATIENT DISPOSITION:  PACU - hemodynamically stable.   Delay start of Pharmacological VTE agent (>24hrs) due to surgical blood loss or risk of bleeding: no

## 2014-08-10 NOTE — Discharge Instructions (Signed)
Activity as tolerated Keep incisions clean and dry  Use Xarelto as blood thinner (at pharmacy) for 12 days to help prevent blood clot in addition to mild exercise    Discharge Instructions After Orthopedic Procedures:  *You may feel tired and weak following your procedure. It is recommended that you limit physical activity for the next 24 hours and rest at home for the remainder of today and tomorrow. *No strenuous activity should be started without your doctor's permission.  Elevate the extremity that you had surgery on to a level above your heart. This should continue for 48 hours or as instructed by your doctor.  If you had hand, arm or shoulder surgery you should move your fingers frequently unless otherwise instructed by your doctor.  If you had foot, knee or leg surgery you should wiggle your toes frequently unless otherwise instructed by your doctor.  Follow your doctor's exact instructions for activity at home. Use your home equipment as instructed. (Crutches, hard shoes, slings etc.)  Limit your activity as instructed by your doctor.  Report to your doctor should any of the following occur: 1. Extreme swelling of your fingers or toes. 2. Inability to wiggle your fingers or toes. 3. Coldness, pale or bluish color in your fingers or toes. 4. Loss of sensation, numbness or tingling of your fingers or toes. 5. Unusual smell or odor from under your dressing or cast. 6. Excessive bleeding or drainage from the surgical site. 7. Pain not relieved by medication your doctor has prescribed for you. 8. Cast or dressing too tight (do not get your dressing or cast wet or put anything under your dressing or cast.)  *Do not change your dressing unless instructed by your doctor or discharge nurse. Then follow exact instructions.  *Follow labeled instructions for any medications that your doctor may have prescribed for you. *Should any questions or complications develop following your  procedure, PLEASE CONTACT YOUR DOCTOR.     Post Anesthesia Home Care Instructions  Activity: Get plenty of rest for the remainder of the day. A responsible adult should stay with you for 24 hours following the procedure.  For the next 24 hours, DO NOT: -Drive a car -Advertising copywriterperate machinery -Drink alcoholic beverages -Take any medication unless instructed by your physician -Make any legal decisions or sign important papers.  Meals: Start with liquid foods such as gelatin or soup. Progress to regular foods as tolerated. Avoid greasy, spicy, heavy foods. If nausea and/or vomiting occur, drink only clear liquids until the nausea and/or vomiting subsides. Call your physician if vomiting continues.  Special Instructions/Symptoms: Your throat may feel dry or sore from the anesthesia or the breathing tube placed in your throat during surgery. If this causes discomfort, gargle with warm salt water. The discomfort should disappear within 24 hours.  If you had a scopolamine patch placed behind your ear for the management of post- operative nausea and/or vomiting:  1. The medication in the patch is effective for 72 hours, after which it should be removed.  Wrap patch in a tissue and discard in the trash. Wash hands thoroughly with soap and water. 2. You may remove the patch earlier than 72 hours if you experience unpleasant side effects which may include dry mouth, dizziness or visual disturbances. 3. Avoid touching the patch. Wash your hands with soap and water after contact with the patch.

## 2014-08-13 ENCOUNTER — Encounter (HOSPITAL_BASED_OUTPATIENT_CLINIC_OR_DEPARTMENT_OTHER): Payer: Self-pay | Admitting: Orthopedic Surgery

## 2014-08-13 NOTE — Op Note (Signed)
Lori Durham, Lori Durham NO.:  [id number]  MEDICAL RECORD NO.:  [id number]  LOCATION:                                 FACILITY:  PHYSICIAN:  Lori Durham. Charlann Durham, M.D.  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  POSTOPERATIVE DIAGNOSES/FINDINGS: 1. Complex tearing to the posterior horn midbody region of the medial     meniscus. 2. 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 3. Grade 2-3 chondromalacia within the patellofemoral compartment     predominantly involving the trochlear surface. 4. Significant synovitic flare-up along the medial and anterior aspect     of the joint with associated chondral deficits on the medial     femoral condyle. 5. Lateral compartment grade 2-3 chondromalacia within the central     portion of the distal weightbearing surface of the lateral femoral     condyle.  PROCEDURES: 1. Right knee diagnostic and operative arthroscopy with partial medial     meniscectomy. 2. Medial, lateral and patellofemoral chondroplasty with debridement     of chondral flaps back to stable level.  No evidence of eburnated     bone.  No abrasion chondroplasty required.  SURGEON:  Lori Durham. Charlann Durham, M.D.  ASSISTANT:  Surgical team.  ANESTHESIA:  General plus local block, preincision as well as postblock intra-articularly.  COMPLICATIONS:  None.  TOURNIQUET:  Not utilized.  TOURNIQUET:  Not utilized.  DRAIN:  Not utilized.  INDICATIONS FOR THE PROCEDURE:  Lori Durham is a 64 year old female who presented to the office for evaluation of right knee pain.  She had failed conservative measures including activity modification, medication, and injection.  She had persistent, recurrent mechanical symptoms and ultimately MRI  was ordered.  MRI confirmed suspicion of meniscal pathology with associated medial joint line tenderness.  Based on the persistence of her symptoms, she wished at this point to proceed with more definitive measures, arthroscopic picture was offered based on her age and desire to return to activities.  Risks of recurrent meniscal pathology, persistent or recurrent symptoms were all discussed and reviewed, and consent was obtained for benefit of pain relief.  PROCEDURE IN DETAIL:  The patient was brought to the operative theater. Once adequate anesthesia, preoperative antibiotics, clindamycin administered due to known allergy.  She was positioned in supine with the right leg in a leg holder.  The right lower extremity was then prepped and draped in sterile fashion.  Time-out was performed identifying the patient, planned procedure, and extremity.  Standard inferior medial, superior medial, and inferior lateral portals were utilized.  Diagnostic evaluation of the knee revealed the above findings.  The inferior medial portal was utilized as the sole working portal on this case.  First attention was directed to the medial compartment of the knee and utilized a combination of biting basket as well as the 3.5 Cuda shaver to perform partial medial meniscectomy involving the posterior horn back to the central-to-peripheral suction of the posterior horn and  blindness into the midbody portion where there was noted to be a large flap tear.  The meniscus was stabilized and blended to normal content, stable level at this point.  Following and during this same time, I was also able to perform chondroplasty in the medial aspect of the joint, debriding unstable chondral flaps, associated with this meniscal pathology back to a stable level.  Once I was satisfied with this, I evaluated anteriorly noted an intact anterior cruciate ligament.  There was some degeneration to it.  As I then attended  to the lateral compartment, we identified an intact meniscus; however, the central weightbearing surface of the lateral femoral condyle was noted to have a grade 2-3 chondral flaps, which were debrided by its stable level, but no eburnated bone exposed.  At this point, I attended anteriorly, identified a fairly significant synovitic flares.  I removed and performed a limited synovectomy in the anterior medial aspect of the joint.  I did identify the associated chondral defect in the medial femoral condyle with this, and I thus justified this further synovectomy in this medial aspect.  Known by MRI findings, there was patellofemoral chondromalacia, which was debrided back to a stable level, I did not see any evidence of eburnated bone, but instead was able to debride back chondral flaps to again stable levels.  Thus, no microfracture abrasion chondroplasty was carried out.  Once I completed this, I reexamined the knee to make certain that the cartilage surfaces were stable without evidence of any fragmentation or further instability.  Once I was satisfied with this, the instrumentation was removed from the knee.  Portal sites were reapproximated using 4-0 nylon.  The knee was injected in the case with 0.25% Marcaine with epinephrine.  The knee was then cleaned, dried and dressed sterilely using a sterile bulky wrap.  She was then brought to the recovery room, extubated in stable condition, tolerating the procedure well.  Findings were reviewed with her husband and we will see her back in the office in 10-12 days for suture removal.  Physical therapy would be utilized to maximize her recover ability as well as education on long- term maintenance of quad function for patellofemoral chondromalacia.  Further management may require the use of cortisone injections for viscosupplementation, and it is still represent strengthen, she does not tolerate anti-inflammatories, so she was given  Ultram and Xarelto for DVT prophylaxis for 12 days.     Lori FrankelMatthew D. Charlann Boxerlin, M.D.     MDO/MEDQ  D:  08/10/2014  T:  08/11/2014  Job:  [id number]

## 2014-08-24 ENCOUNTER — Ambulatory Visit (HOSPITAL_COMMUNITY)
Admission: RE | Admit: 2014-08-24 | Discharge: 2014-08-24 | Disposition: A | Discharge status: Home / Self Care | Payer: 59 | Source: Ambulatory Visit | Attending: Orthopedic Surgery | Admitting: Orthopedic Surgery

## 2014-08-24 ENCOUNTER — Other Ambulatory Visit (HOSPITAL_COMMUNITY): Payer: Self-pay | Admitting: Orthopedic Surgery

## 2014-08-24 DIAGNOSIS — R609 Edema, unspecified: Secondary | ICD-10-CM | POA: Diagnosis not present

## 2014-08-24 DIAGNOSIS — M79604 Pain in right leg: Secondary | ICD-10-CM | POA: Insufficient documentation

## 2014-08-24 NOTE — Progress Notes (Signed)
Preliminary results be tech - Right lower ext. Venous duplex completed. Negative for deep and superficial vein thrombosis in the right lower extremity. Marilynne Halstedita Jeri Jeanbaptiste, BS, RDMS, RVT

## 2014-08-27 ENCOUNTER — Other Ambulatory Visit: Payer: Self-pay

## 2014-08-27 ENCOUNTER — Encounter: Payer: Self-pay | Admitting: Family Medicine

## 2014-08-27 DIAGNOSIS — M8580 Other specified disorders of bone density and structure, unspecified site: Secondary | ICD-10-CM

## 2014-08-27 MED ORDER — ALPRAZOLAM 0.25 MG PO TABS: 0.2500 mg | ORAL_TABLET | Freq: Three times a day (TID) | ORAL | Status: DC | PRN

## 2014-08-27 MED ORDER — DICLOFENAC SODIUM 1 % TD GEL: 4.0000 g | Freq: Three times a day (TID) | TRANSDERMAL | Status: DC | PRN

## 2014-08-27 NOTE — Telephone Encounter (Signed)
I ordered the DEXA. Please call in Voltaren 1% gel to apply tid prn pain, 300 grams with 5 rf, also Xanax #90 with 5rf

## 2014-08-29 ENCOUNTER — Other Ambulatory Visit: Payer: Self-pay | Admitting: Family Medicine

## 2014-09-05 ENCOUNTER — Encounter: Payer: Self-pay | Admitting: Family Medicine

## 2014-09-05 DIAGNOSIS — M8580 Other specified disorders of bone density and structure, unspecified site: Secondary | ICD-10-CM

## 2014-09-06 NOTE — Telephone Encounter (Signed)
I ordered the DEXA on 08-27-14. Please see why this has not been scheduled

## 2014-09-07 NOTE — Telephone Encounter (Signed)
Can you check into this? 

## 2014-09-10 NOTE — Telephone Encounter (Signed)
I left pt a message to cb and schedule the bone density.

## 2014-09-11 NOTE — Telephone Encounter (Signed)
Pt would like to go to [facility] where she had her last one. The order in the system is for [facility]. Pt needs an order/referral for [facility].

## 2014-11-13 ENCOUNTER — Other Ambulatory Visit: Payer: Self-pay | Admitting: Family Medicine

## 2014-12-14 ENCOUNTER — Encounter: Payer: Self-pay | Admitting: Family Medicine

## 2014-12-14 DIAGNOSIS — Z0000 Encounter for general adult medical examination without abnormal findings: Secondary | ICD-10-CM

## 2014-12-14 NOTE — Telephone Encounter (Signed)
this is a question for Dr. Elvera Lennox. She is managing this problem, not me

## 2014-12-14 NOTE — Telephone Encounter (Signed)
I understand. We took over treating this and she is now taking Actonel. Her last DEXA was in 2012 and it was normal. Instead of ordering a new DEXA with a dx of "osteoporosis" I will try ordering one with a preventative code.

## 2014-12-17 NOTE — Telephone Encounter (Signed)
I'm sorry but I don't know what else to do. The insurance company can do whatever they want unfortunately. The reason I suggested seeing Endocrine again is that a specialist can often get a test approved when a primary care provider cannot

## 2015-03-05 ENCOUNTER — Other Ambulatory Visit: Payer: Self-pay | Admitting: Family Medicine

## 2015-03-05 NOTE — Telephone Encounter (Signed)
Refill request for Synthroid 125 mcg take 1 po qd and send to [facility].

## 2015-03-06 ENCOUNTER — Other Ambulatory Visit: Payer: Self-pay | Admitting: Family Medicine

## 2015-03-06 NOTE — Telephone Encounter (Signed)
I sent script e-scribe. 

## 2015-03-12 ENCOUNTER — Encounter: Payer: Self-pay | Admitting: Family Medicine

## 2015-03-12 MED ORDER — CEFUROXIME AXETIL 500 MG PO TABS: 500.0000 mg | ORAL_TABLET | Freq: Two times a day (BID) | ORAL | Status: DC

## 2015-03-12 NOTE — Telephone Encounter (Signed)
Call in Ceftin 500 mg bid for 10 days  

## 2015-03-14 ENCOUNTER — Encounter: Payer: Self-pay | Admitting: Family Medicine

## 2015-03-15 ENCOUNTER — Other Ambulatory Visit (INDEPENDENT_AMBULATORY_CARE_PROVIDER_SITE_OTHER): Payer: Commercial Managed Care - HMO

## 2015-03-15 DIAGNOSIS — Z0000 Encounter for general adult medical examination without abnormal findings: Secondary | ICD-10-CM | POA: Diagnosis not present

## 2015-03-15 LAB — POCT URINALYSIS DIPSTICK
BILIRUBIN UA: NEGATIVE
GLUCOSE UA: NEGATIVE
Ketones, UA: NEGATIVE
LEUKOCYTES UA: NEGATIVE
NITRITE UA: NEGATIVE
PROTEIN UA: NEGATIVE
RBC UA: NEGATIVE
Spec Grav, UA: 1.02
UROBILINOGEN UA: 0.2
pH, UA: 7.5

## 2015-03-15 LAB — CBC WITH DIFFERENTIAL/PLATELET
BASOS ABS: 0 10*3/uL (ref 0.0–0.1)
Basophils Relative: 0.5 % (ref 0.0–3.0)
EOS ABS: 0.4 10*3/uL (ref 0.0–0.7)
Eosinophils Relative: 7.8 % — ABNORMAL HIGH (ref 0.0–5.0)
HCT: 41.3 % (ref 36.0–46.0)
Hemoglobin: 14 g/dL (ref 12.0–15.0)
LYMPHS ABS: 2.2 10*3/uL (ref 0.7–4.0)
Lymphocytes Relative: 48.5 % — ABNORMAL HIGH (ref 12.0–46.0)
MCHC: 33.9 g/dL (ref 30.0–36.0)
MCV: 104 fl — ABNORMAL HIGH (ref 78.0–100.0)
MONO ABS: 0.5 10*3/uL (ref 0.1–1.0)
MONOS PCT: 10 % (ref 3.0–12.0)
NEUTROS ABS: 1.5 10*3/uL (ref 1.4–7.7)
NEUTROS PCT: 33.2 % — AB (ref 43.0–77.0)
PLATELETS: 221 10*3/uL (ref 150.0–400.0)
RBC: 3.97 Mil/uL (ref 3.87–5.11)
RDW: 12.2 % (ref 11.5–15.5)
WBC: 4.5 10*3/uL (ref 4.0–10.5)

## 2015-03-15 LAB — BASIC METABOLIC PANEL
BUN: 14 mg/dL (ref 6–23)
CALCIUM: 9.1 mg/dL (ref 8.4–10.5)
CO2: 31 meq/L (ref 19–32)
CREATININE: 0.61 mg/dL (ref 0.40–1.20)
Chloride: 103 mEq/L (ref 96–112)
GFR: 105.56 mL/min (ref >60.00)
GLUCOSE: 82 mg/dL (ref 70–99)
Potassium: 3.9 mEq/L (ref 3.5–5.1)
SODIUM: 141 meq/L (ref 135–145)

## 2015-03-15 LAB — HEPATIC FUNCTION PANEL
ALBUMIN: 3.7 g/dL (ref 3.5–5.2)
ALK PHOS: 75 U/L (ref 39–117)
ALT: 19 U/L (ref 0–35)
AST: 19 U/L (ref 0–37)
BILIRUBIN DIRECT: 0.1 mg/dL (ref 0.0–0.3)
BILIRUBIN TOTAL: 0.7 mg/dL (ref 0.2–1.2)
Total Protein: 6.5 g/dL (ref 6.0–8.3)

## 2015-03-15 LAB — LIPID PANEL
CHOL/HDL RATIO: 3
Cholesterol: 160 mg/dL (ref 0–200)
HDL: 63.4 mg/dL (ref >39.00)
LDL Cholesterol: 72 mg/dL (ref 0–99)
NONHDL: 96.37
Triglycerides: 124 mg/dL (ref 0.0–149.0)
VLDL: 24.8 mg/dL (ref 0.0–40.0)

## 2015-03-15 LAB — TSH: TSH: 1.73 u[IU]/mL (ref 0.35–4.50)

## 2015-03-15 MED ORDER — FLUCONAZOLE 150 MG PO TABS: 150.0000 mg | ORAL_TABLET | Freq: Once | ORAL | Status: DC

## 2015-03-15 NOTE — Telephone Encounter (Signed)
Call in Diflcuan 150 mg to take one prn, #1 with 5 rf

## 2015-03-21 ENCOUNTER — Encounter: Payer: Self-pay | Admitting: Family Medicine

## 2015-03-21 ENCOUNTER — Ambulatory Visit (INDEPENDENT_AMBULATORY_CARE_PROVIDER_SITE_OTHER): Payer: Commercial Managed Care - HMO | Admitting: Family Medicine

## 2015-03-21 VITALS — BP 160/90 | HR 80 | Temp 98.6°F | Ht 66.0 in | Wt 237.0 lb

## 2015-03-21 DIAGNOSIS — J019 Acute sinusitis, unspecified: Secondary | ICD-10-CM

## 2015-03-21 DIAGNOSIS — Z0000 Encounter for general adult medical examination without abnormal findings: Secondary | ICD-10-CM

## 2015-03-21 MED ORDER — LIOTHYRONINE SODIUM 5 MCG PO TABS: 5.0000 ug | ORAL_TABLET | Freq: Every day | ORAL | Status: DC

## 2015-03-21 MED ORDER — SYNTHROID 125 MCG PO TABS: 125.0000 ug | ORAL_TABLET | Freq: Every day | ORAL | Status: DC

## 2015-03-21 MED ORDER — METHYLPREDNISOLONE ACETATE 80 MG/ML IJ SUSP
120.0000 mg | Freq: Once | INTRAMUSCULAR | Status: AC
  Administered 2015-03-21: 120 mg via INTRAMUSCULAR

## 2015-03-21 MED ORDER — RALOXIFENE HCL 60 MG PO TABS: 60.0000 mg | ORAL_TABLET | Freq: Every day | ORAL | Status: DC

## 2015-03-21 NOTE — Progress Notes (Signed)
   Subjective:    Patient ID: Lori Durham, female    DOB: 10/01/1950, 64 y.o.   MRN: [id number]  HPI 64 yr old female for a cpx. She has been doing well until she got a sinus infection about 2 weeks ago. She has had sinus pressure, PND, blowing mucus from the nose, and a dry cough. She just finished a 10 day course of Ceftin and she is feeling better.    Review of Systems  Constitutional: Negative.   HENT: Positive for congestion and postnasal drip. Negative for dental problem, drooling, ear discharge, ear pain, facial swelling, hearing loss, mouth sores, nosebleeds, rhinorrhea, sinus pressure, sneezing, sore throat, tinnitus, trouble swallowing and voice change.   Eyes: Negative.   Respiratory: Positive for cough. Negative for apnea, choking, chest tightness, shortness of breath, wheezing and stridor.   Cardiovascular: Negative.   Gastrointestinal: Negative.   Genitourinary: Negative for dysuria, urgency, frequency, hematuria, flank pain, decreased urine volume, enuresis, difficulty urinating, pelvic pain and dyspareunia.  Musculoskeletal: Negative.   Skin: Negative.   Neurological: Negative.   Psychiatric/Behavioral: Negative.        Objective:   Physical Exam  Constitutional: She is oriented to person, place, and time. She appears well-developed and well-nourished. No distress.  HENT:  Head: Normocephalic and atraumatic.  Right Ear: External ear normal.  Left Ear: External ear normal.  Nose: Nose normal.  Mouth/Throat: Oropharynx is clear and moist. No oropharyngeal exudate.  Eyes: Conjunctivae and EOM are normal. Pupils are equal, round, and reactive to light. No scleral icterus.  Neck: Normal range of motion. Neck supple. No JVD present. No thyromegaly present.  Cardiovascular: Normal rate, regular rhythm, normal heart sounds and intact distal pulses.  Exam reveals no gallop and no friction rub.   No murmur heard. EKG normal   Pulmonary/Chest: Effort normal and breath  sounds normal. No respiratory distress. She has no wheezes. She has no rales. She exhibits no tenderness.  Abdominal: Soft. Bowel sounds are normal. She exhibits no distension and no mass. There is no tenderness. There is no rebound and no guarding.  Musculoskeletal: Normal range of motion. She exhibits no edema or tenderness.  Lymphadenopathy:    She has no cervical adenopathy.  Neurological: She is alert and oriented to person, place, and time. She has normal reflexes. No cranial nerve deficit. She exhibits normal muscle tone. Coordination normal.  Skin: Skin is warm and dry. No rash noted. No erythema.  Psychiatric: She has a normal mood and affect. Her behavior is normal. Judgment and thought content normal.          Assessment & Plan:  Well exam. We discussed diet and exercise. She is recovering from a sinusitis. Given a steroid shot today to help relieve sinus congestion. Recheck prn

## 2015-03-21 NOTE — Progress Notes (Signed)
Pre visit review using our clinic review tool, if applicable. No additional management support is needed unless otherwise documented below in the visit note. 

## 2015-03-21 NOTE — Addendum Note (Signed)
Addended by: Aniceto BossNIMMONS, SYLVIA A on: 03/21/2015 10:15 AM   Modules accepted: Orders

## 2015-03-27 ENCOUNTER — Encounter: Payer: Self-pay | Admitting: Family Medicine

## 2015-03-27 MED ORDER — SYNTHROID 125 MCG PO TABS: 125.0000 ug | ORAL_TABLET | Freq: Every day | ORAL | Status: DC

## 2015-03-27 MED ORDER — RALOXIFENE HCL 60 MG PO TABS: 60.0000 mg | ORAL_TABLET | Freq: Every day | ORAL | Status: DC

## 2015-03-27 MED ORDER — LIOTHYRONINE SODIUM 5 MCG PO TABS: 5.0000 ug | ORAL_TABLET | Freq: Every day | ORAL | Status: DC

## 2015-03-27 NOTE — Addendum Note (Signed)
Addended by: Azucena FreedMILLNER, Franciso Dierks C on: 03/27/2015 02:56 PM   Modules accepted: Orders

## 2015-03-29 ENCOUNTER — Other Ambulatory Visit: Payer: Self-pay | Admitting: Family Medicine

## 2015-03-29 MED ORDER — RALOXIFENE HCL 60 MG PO TABS: 60.0000 mg | ORAL_TABLET | Freq: Every day | ORAL | Status: DC

## 2015-03-29 MED ORDER — SYNTHROID 125 MCG PO TABS: 125.0000 ug | ORAL_TABLET | Freq: Every day | ORAL | Status: DC

## 2015-03-29 MED ORDER — LIOTHYRONINE SODIUM 5 MCG PO TABS: 5.0000 ug | ORAL_TABLET | Freq: Every day | ORAL | Status: DC

## 2015-04-02 ENCOUNTER — Other Ambulatory Visit: Payer: Self-pay | Admitting: Family Medicine

## 2015-04-29 LAB — HM MAMMOGRAPHY

## 2015-05-01 ENCOUNTER — Encounter: Payer: Self-pay | Admitting: Family Medicine

## 2015-09-02 ENCOUNTER — Ambulatory Visit (INDEPENDENT_AMBULATORY_CARE_PROVIDER_SITE_OTHER): Payer: Commercial Managed Care - HMO | Admitting: Family Medicine

## 2015-09-02 VITALS — BP 144/90 | HR 84 | Temp 99.4°F | Ht 66.0 in | Wt 239.0 lb

## 2015-09-02 DIAGNOSIS — R21 Rash and other nonspecific skin eruption: Secondary | ICD-10-CM

## 2015-09-02 DIAGNOSIS — R51 Headache: Secondary | ICD-10-CM

## 2015-09-02 DIAGNOSIS — R519 Headache, unspecified: Secondary | ICD-10-CM

## 2015-09-02 MED ORDER — CLOTRIMAZOLE-BETAMETHASONE 1-0.05 % EX CREA: 1.0000 "application " | TOPICAL_CREAM | Freq: Two times a day (BID) | CUTANEOUS | Status: DC

## 2015-09-02 MED ORDER — KETOROLAC TROMETHAMINE 60 MG/2ML IM SOLN
60.0000 mg | Freq: Once | INTRAMUSCULAR | Status: AC
  Administered 2015-09-02: 60 mg via INTRAMUSCULAR

## 2015-09-02 NOTE — Patient Instructions (Signed)

## 2015-09-02 NOTE — Progress Notes (Signed)
Subjective:    Patient ID: Lori Durham, female    DOB: [birth year], 65 y.o.   MRN: [id number]  HPI  Patient is seen as an acute visit for the following items   Skin rash. Onset this past Saturday. Location is under the left breast.  She was initially concerned that she may have shingles.  Her husband apparently is currently battling with shingles.  Her rash is slightly pruritic but no associated pain.  No associated vesicles. She's not tried anything topically.  Severity of symptoms is mild.  ?exacerbated by heat.   Second problem is headache. Onset this past Friday.  Location is bifrontal.  Quality is dull. 8 out of 10 severity at its worst.  She tried Tylenol without much improvement.  Occasionally exacerbated by cough or sneeze.  Denies any associated nausea, vomiting, visual change, sinusitis symptoms, recent tick bites. No recent injury. No history of migraine headaches. No history of similar headache.  She has past history of gastric bypass and avoids oral non-steroidals.  Past Medical History  Diagnosis Date  . Hyperlipidemia   . Varicose veins of both lower extremities with pain   . Vitamin D deficiency   . Osteoporosis     last DEXA on 11-20-10 was normal   . History of colon polyps   . Asthma, mild intermittent   . Hypothyroidism, postsurgical   . History of exercise stress test     ETT--  06-17-2007--  NORMAL  . Right knee meniscal tear   . OSA (obstructive sleep apnea)     MODERATE OSA PER STUDY 09-25-2009--- no longer uses cpap,  no issue since gastric sleeve surgery and lost wt.  . History of hypertension     no issue since gastric sleeve surgery and lost wt.  . History of diabetes mellitus     no issue since gastric sleeve surgery and lost wt.  Marland Kitchen PONV (postoperative nausea and vomiting)   . GERD (gastroesophageal reflux disease)     watches diet  . Wears glasses    Past Surgical History  Procedure Laterality Date  . Colonoscopy  12-07-11    per Dr.  Rodena Piety at [facility], clear (hx of adenomatous polyps), repeat in 5 yrs   . Thyroid lobectomy Left 1991    benign  . Laparoscopic gastric sleeve resection  11-15-2009   [place]  . Laparoscopic cholecystectomy  Nov 2012  [place]  . Tonsillectomy and adenoidectomy  age 20  . Vaginal hysterectomy  1995    w/ ovaries intact  . Knee arthroscopy Right 08/10/2014    Procedure: ARTHROSCOPY RIGHT KNEE WITH DEBRIDEMENT;  Surgeon: Durene Romans, MD;  Location: [facility];  Service: Orthopedics;  Laterality: Right;    reports that she has never smoked. She has never used smokeless tobacco. She reports that she drinks about 4.2 oz of alcohol per week. She reports that she does not use illicit drugs. family history includes Alcohol abuse in her brother, brother, father, and paternal grandfather; Arthritis in her other; Coronary artery disease in her other; Heart attack (age of onset: 61) in her brother; Heart disease in her brother, maternal grandmother, and mother; Hyperlipidemia in her other; Hypertension in her mother and other; Hypothyroidism in her mother; Osteoporosis in her brother and brother; Other in her brother. Allergies  Allergen Reactions  . Augmentin [Amoxicillin-Pot Clavulanate] Hives  . Contrast Media [Iodinated Diagnostic Agents] Hives  . Hydrocodone Other (See Comments)    ( cough syrup )  light headed      Review of Systems  Constitutional: Negative for fever and chills.  HENT: Negative for congestion and sinus pressure.   Eyes: Negative for visual disturbance.  Gastrointestinal: Negative for nausea and vomiting.  Skin: Positive for rash.  Neurological: Positive for headaches. Negative for dizziness, seizures, syncope, speech difficulty and weakness.  Hematological: Negative for adenopathy.  Psychiatric/Behavioral: Negative for confusion.       Objective:   Physical Exam  Constitutional: She is oriented to person, place, and time. She appears  well-developed and well-nourished.  Eyes: Pupils are equal, round, and reactive to light.  Fundi benign  Neck: Neck supple.  Cardiovascular: Normal rate and regular rhythm.   Pulmonary/Chest: Effort normal and breath sounds normal. No respiratory distress. She has no wheezes. She has no rales.  Musculoskeletal: She exhibits no edema.  Lymphadenopathy:    She has no cervical adenopathy.  Neurological: She is alert and oriented to person, place, and time. No cranial nerve deficit. Coordination normal.  No focal strength deficits. Gait normal  Skin: Rash noted.  Psychiatric: She has a normal mood and affect. Her behavior is normal. Judgment and thought content normal.          Assessment & Plan:   #1 skin rash. Location is under left breast. This may be simply "heat rash" but possible early fungal involvement. Lotrisone cream twice daily. Keep areas dry as possible. Touch base in one week if not improving. Patient is reassured that she has no evidence for shingles   #2 headache. Probably tension-type. She does not have history of frequent headaches. No hx of migraine headache. She's not had anything focal neurologically. No sinusitis symptoms. We have not recommended opioids. No relief with over-the-counter Tylenol. We discussed risk and benefits of trial Toradol injection and patient consents. Toradol 60 mg IM given. Touch base if headache not resolving over the next day  Kristian CoveyBruce W Gautham Hewins MD [facility]

## 2015-09-02 NOTE — Progress Notes (Signed)
Pre visit review using our clinic review tool, if applicable. No additional management support is needed unless otherwise documented below in the visit note. 

## 2016-01-06 ENCOUNTER — Other Ambulatory Visit: Payer: Self-pay | Admitting: Family Medicine

## 2016-02-25 ENCOUNTER — Encounter: Payer: Self-pay | Admitting: Family Medicine

## 2016-03-30 ENCOUNTER — Ambulatory Visit (INDEPENDENT_AMBULATORY_CARE_PROVIDER_SITE_OTHER): Payer: BLUE CROSS/BLUE SHIELD | Admitting: Family Medicine

## 2016-03-30 ENCOUNTER — Encounter: Payer: Self-pay | Admitting: Family Medicine

## 2016-03-30 VITALS — BP 148/94 | Temp 99.3°F | Ht 66.0 in | Wt 247.0 lb

## 2016-03-30 DIAGNOSIS — J40 Bronchitis, not specified as acute or chronic: Secondary | ICD-10-CM | POA: Diagnosis not present

## 2016-03-30 MED ORDER — CLARITHROMYCIN 500 MG PO TABS: 500.0000 mg | ORAL_TABLET | Freq: Two times a day (BID) | ORAL | 0 refills | Status: DC

## 2016-03-30 MED ORDER — ACYCLOVIR 400 MG PO TABS: 400.0000 mg | ORAL_TABLET | ORAL | 3 refills | Status: DC | PRN

## 2016-03-30 MED ORDER — BENZONATATE 200 MG PO CAPS: 200.0000 mg | ORAL_CAPSULE | Freq: Two times a day (BID) | ORAL | 0 refills | Status: DC | PRN

## 2016-03-30 MED ORDER — ALBUTEROL SULFATE HFA 108 (90 BASE) MCG/ACT IN AERS: 2.0000 | INHALATION_SPRAY | RESPIRATORY_TRACT | 2 refills | Status: DC | PRN

## 2016-03-30 MED ORDER — METHYLPREDNISOLONE ACETATE 80 MG/ML IJ SUSP
120.0000 mg | Freq: Once | INTRAMUSCULAR | Status: AC
  Administered 2016-03-30: 120 mg via INTRAMUSCULAR

## 2016-03-30 NOTE — Addendum Note (Signed)
Addended by: Aniceto BossNIMMONS, Glendoris Nodarse A on: 03/30/2016 11:50 AM   Modules accepted: Orders

## 2016-03-30 NOTE — Progress Notes (Signed)
Pre visit review using our clinic review tool, if applicable. No additional management support is needed unless otherwise documented below in the visit note. 

## 2016-03-30 NOTE — Progress Notes (Signed)
   Subjective:    Patient ID: Lori Durham, female    DOB: 1950/05/10, 66 y.o.   MRN: [id number]  HPI Here for 2 weeks of chest tightness and coughing up yellow sputum. Some fever at times. Using Mucinex.    Review of Systems  Constitutional: Positive for fever.  HENT: Negative.   Eyes: Negative.   Respiratory: Positive for cough, chest tightness, shortness of breath and wheezing.   Cardiovascular: Negative.        Objective:   Physical Exam  Constitutional: She appears well-developed and well-nourished.  HENT:  Right Ear: External ear normal.  Left Ear: External ear normal.  Nose: Nose normal.  Mouth/Throat: Oropharynx is clear and moist.  Eyes: Conjunctivae are normal.  Neck: No thyromegaly present.  Cardiovascular: Normal rate, regular rhythm, normal heart sounds and intact distal pulses.   Pulmonary/Chest: Effort normal. No respiratory distress. She has no rales.  Scattered rhonchi and wheezes   Lymphadenopathy:    She has no cervical adenopathy.          Assessment & Plan:  Bronchitis, treat with Biaxin. Use Benzonatate for cough. Given a steroid shot.  Gershon CraneStephen Sophya Vanblarcom, MD

## 2016-04-01 ENCOUNTER — Encounter: Payer: Self-pay | Admitting: Family Medicine

## 2016-04-02 NOTE — Telephone Encounter (Signed)
[facility] [address]

## 2016-04-03 MED ORDER — FLUCONAZOLE 150 MG PO TABS
150.0000 mg | ORAL_TABLET | Freq: Once | ORAL | 5 refills | Status: AC
Start: 2016-04-03 — End: 2016-04-03

## 2016-04-03 MED ORDER — HYDROCODONE-HOMATROPINE 5-1.5 MG/5ML PO SYRP: 5.0000 mL | ORAL_SOLUTION | ORAL | 0 refills | Status: DC | PRN

## 2016-04-03 NOTE — Telephone Encounter (Signed)
done

## 2016-05-08 ENCOUNTER — Other Ambulatory Visit (INDEPENDENT_AMBULATORY_CARE_PROVIDER_SITE_OTHER): Payer: BLUE CROSS/BLUE SHIELD

## 2016-05-08 DIAGNOSIS — Z0000 Encounter for general adult medical examination without abnormal findings: Secondary | ICD-10-CM | POA: Diagnosis not present

## 2016-05-08 LAB — CBC WITH DIFFERENTIAL/PLATELET
BASOS ABS: 0 10*3/uL (ref 0.0–0.1)
BASOS PCT: 0.4 % (ref 0.0–3.0)
EOS ABS: 0.2 10*3/uL (ref 0.0–0.7)
Eosinophils Relative: 4.5 % (ref 0.0–5.0)
HCT: 41.2 % (ref 36.0–46.0)
HEMOGLOBIN: 14.2 g/dL (ref 12.0–15.0)
Lymphocytes Relative: 36.1 % (ref 12.0–46.0)
Lymphs Abs: 2 10*3/uL (ref 0.7–4.0)
MCHC: 34.4 g/dL (ref 30.0–36.0)
MCV: 106 fl — ABNORMAL HIGH (ref 78.0–100.0)
MONO ABS: 0.4 10*3/uL (ref 0.1–1.0)
Monocytes Relative: 7.4 % (ref 3.0–12.0)
Neutro Abs: 2.8 10*3/uL (ref 1.4–7.7)
Neutrophils Relative %: 51.6 % (ref 43.0–77.0)
Platelets: 218 10*3/uL (ref 150.0–400.0)
RBC: 3.88 Mil/uL (ref 3.87–5.11)
RDW: 12.2 % (ref 11.5–15.5)
WBC: 5.4 10*3/uL (ref 4.0–10.5)

## 2016-05-08 LAB — HEPATIC FUNCTION PANEL
ALT: 18 U/L (ref 0–35)
AST: 16 U/L (ref 0–37)
Albumin: 4 g/dL (ref 3.5–5.2)
Alkaline Phosphatase: 95 U/L (ref 39–117)
BILIRUBIN DIRECT: 0.2 mg/dL (ref 0.0–0.3)
BILIRUBIN TOTAL: 0.9 mg/dL (ref 0.2–1.2)
Total Protein: 6.4 g/dL (ref 6.0–8.3)

## 2016-05-08 LAB — LIPID PANEL
CHOL/HDL RATIO: 2
Cholesterol: 206 mg/dL — ABNORMAL HIGH (ref 0–200)
HDL: 83.4 mg/dL (ref >39.00)
LDL CALC: 98 mg/dL (ref 0–99)
NonHDL: 123.04
TRIGLYCERIDES: 125 mg/dL (ref 0.0–149.0)
VLDL: 25 mg/dL (ref 0.0–40.0)

## 2016-05-08 LAB — POC URINALSYSI DIPSTICK (AUTOMATED)
Bilirubin, UA: NEGATIVE
Blood, UA: NEGATIVE
Glucose, UA: NEGATIVE
Ketones, UA: NEGATIVE
LEUKOCYTES UA: NEGATIVE
Nitrite, UA: NEGATIVE
PROTEIN UA: NEGATIVE
Spec Grav, UA: 1.02
UROBILINOGEN UA: 0.2
pH, UA: 7.5

## 2016-05-08 LAB — BASIC METABOLIC PANEL
BUN: 16 mg/dL (ref 6–23)
CALCIUM: 8.9 mg/dL (ref 8.4–10.5)
CHLORIDE: 104 meq/L (ref 96–112)
CO2: 30 mEq/L (ref 19–32)
CREATININE: 0.67 mg/dL (ref 0.40–1.20)
GFR: 94.38 mL/min (ref >60.00)
Glucose, Bld: 93 mg/dL (ref 70–99)
Potassium: 3.9 mEq/L (ref 3.5–5.1)
Sodium: 141 mEq/L (ref 135–145)

## 2016-05-08 LAB — TSH: TSH: 1.79 u[IU]/mL (ref 0.35–4.50)

## 2016-05-14 ENCOUNTER — Encounter: Payer: Self-pay | Admitting: Family Medicine

## 2016-05-14 ENCOUNTER — Ambulatory Visit (INDEPENDENT_AMBULATORY_CARE_PROVIDER_SITE_OTHER): Payer: BLUE CROSS/BLUE SHIELD | Admitting: Family Medicine

## 2016-05-14 VITALS — BP 166/84 | HR 94 | Temp 99.2°F | Ht 66.0 in | Wt 242.2 lb

## 2016-05-14 DIAGNOSIS — Z0000 Encounter for general adult medical examination without abnormal findings: Secondary | ICD-10-CM

## 2016-05-14 MED ORDER — LIOTHYRONINE SODIUM 5 MCG PO TABS: 5.0000 ug | ORAL_TABLET | Freq: Every day | ORAL | 3 refills | Status: DC

## 2016-05-14 MED ORDER — ALPRAZOLAM 0.25 MG PO TABS: 0.2500 mg | ORAL_TABLET | Freq: Three times a day (TID) | ORAL | 5 refills | Status: DC | PRN

## 2016-05-14 MED ORDER — SYNTHROID 125 MCG PO TABS: 125.0000 ug | ORAL_TABLET | Freq: Every day | ORAL | 3 refills | Status: DC

## 2016-05-14 MED ORDER — FLUCONAZOLE 150 MG PO TABS: 150.0000 mg | ORAL_TABLET | Freq: Once | ORAL | 11 refills | Status: AC

## 2016-05-14 MED ORDER — RALOXIFENE HCL 60 MG PO TABS: 60.0000 mg | ORAL_TABLET | Freq: Every day | ORAL | 3 refills | Status: DC

## 2016-05-14 MED ORDER — CLARITHROMYCIN 500 MG PO TABS: 500.0000 mg | ORAL_TABLET | Freq: Two times a day (BID) | ORAL | 0 refills | Status: DC

## 2016-05-14 NOTE — Progress Notes (Signed)
Pre visit review using our clinic review tool, if applicable. No additional management support is needed unless otherwise documented below in the visit note. 

## 2016-05-14 NOTE — Progress Notes (Signed)
   Subjective:    Patient ID: Lori [facility], female    DOB: [birth year], 66 y.o.   MRN: [id number]  HPI 66 yr old female for a well exam. She has been doing well in general but she has another URI. She was seen here on 03-30-16 for a bronchitis and was given Biaxin. This worked well and she felt fine until this past weekend when she spent time with some family members, including young children who were coughing. Now she has a cough producing green sputum again. No fever.    Review of Systems  Constitutional: Negative.   HENT: Negative.   Eyes: Negative.   Respiratory: Positive for cough and chest tightness. Negative for shortness of breath and wheezing.   Cardiovascular: Negative.   Gastrointestinal: Negative.   Genitourinary: Negative for decreased urine volume, difficulty urinating, dyspareunia, dysuria, enuresis, flank pain, frequency, hematuria, pelvic pain and urgency.  Musculoskeletal: Negative.   Skin: Negative.   Neurological: Negative.   Psychiatric/Behavioral: Negative.        Objective:   Physical Exam  Constitutional: She is oriented to person, place, and time. She appears well-developed and well-nourished. No distress.  HENT:  Head: Normocephalic and atraumatic.  Right Ear: External ear normal.  Left Ear: External ear normal.  Nose: Nose normal.  Mouth/Throat: Oropharynx is clear and moist. No oropharyngeal exudate.  Eyes: Conjunctivae and EOM are normal. Pupils are equal, round, and reactive to light. No scleral icterus.  Neck: Normal range of motion. Neck supple. No JVD present. No thyromegaly present.  Cardiovascular: Normal rate, regular rhythm, normal heart sounds and intact distal pulses.  Exam reveals no gallop and no friction rub.   No murmur heard. Pulmonary/Chest: Effort normal. No respiratory distress. She has no wheezes. She has no rales. She exhibits no tenderness.  Scattered rhonchi   Abdominal: Soft. Bowel sounds are normal. She exhibits no distension  and no mass. There is no tenderness. There is no rebound and no guarding.  Musculoskeletal: Normal range of motion. She exhibits no edema or tenderness.  Lymphadenopathy:    She has no cervical adenopathy.  Neurological: She is alert and oriented to person, place, and time. She has normal reflexes. No cranial nerve deficit. She exhibits normal muscle tone. Coordination normal.  Skin: Skin is warm and dry. No rash noted. No erythema.  Psychiatric: She has a normal mood and affect. Her behavior is normal. Judgment and thought content normal.          Assessment & Plan:  Well exam. We discussed diet and exercise. She has another bronchitis so we will treat this with Biaxin again. Gershon CraneStephen Adric Wrede, MD

## 2016-08-06 LAB — HM MAMMOGRAPHY

## 2016-08-10 ENCOUNTER — Other Ambulatory Visit: Payer: Self-pay | Admitting: Neurological Surgery

## 2016-08-10 DIAGNOSIS — M4317 Spondylolisthesis, lumbosacral region: Secondary | ICD-10-CM

## 2016-08-11 ENCOUNTER — Encounter: Payer: Self-pay | Admitting: Family Medicine

## 2016-08-22 ENCOUNTER — Ambulatory Visit
Admission: RE | Admit: 2016-08-22 | Discharge: 2016-08-22 | Disposition: A | Discharge status: Home / Self Care | Payer: BLUE CROSS/BLUE SHIELD | Source: Ambulatory Visit | Attending: Neurological Surgery | Admitting: Neurological Surgery

## 2016-08-22 DIAGNOSIS — M4317 Spondylolisthesis, lumbosacral region: Secondary | ICD-10-CM

## 2016-08-28 ENCOUNTER — Other Ambulatory Visit: Payer: BLUE CROSS/BLUE SHIELD

## 2016-10-07 ENCOUNTER — Ambulatory Visit (INDEPENDENT_AMBULATORY_CARE_PROVIDER_SITE_OTHER): Payer: BLUE CROSS/BLUE SHIELD | Admitting: Family Medicine

## 2016-10-07 ENCOUNTER — Encounter: Payer: Self-pay | Admitting: Family Medicine

## 2016-10-07 VITALS — BP 156/98 | Temp 99.1°F | Ht 66.0 in | Wt 248.0 lb

## 2016-10-07 DIAGNOSIS — K0510 Chronic gingivitis, plaque induced: Secondary | ICD-10-CM | POA: Diagnosis not present

## 2016-10-07 MED ORDER — FLUCONAZOLE 150 MG PO TABS: 150.0000 mg | ORAL_TABLET | Freq: Once | ORAL | 11 refills | Status: AC

## 2016-10-07 MED ORDER — CLINDAMYCIN HCL 300 MG PO CAPS: 300.0000 mg | ORAL_CAPSULE | Freq: Four times a day (QID) | ORAL | 0 refills | Status: DC

## 2016-10-07 NOTE — Patient Instructions (Signed)
WE NOW OFFER   [facility] FAST TRACK!!!  SAME DAY Appointments for ACUTE CARE  Such as: Sprains, Injuries, cuts, abrasions, rashes, muscle pain, joint pain, back pain Colds, flu, sore throats, headache, allergies, cough, fever  Ear pain, sinus and eye infections Abdominal pain, nausea, vomiting, diarrhea, upset stomach Animal/insect bites  3 Easy Ways to Schedule: Walk-In Scheduling Call in scheduling Mychart Sign-up: [email or web address]         

## 2016-10-07 NOTE — Progress Notes (Signed)
   Subjective:    Patient ID: Lori [facility], female    DOB: Jan 24, 1951, 66 y.o.   MRN: [id number]  HPI Here for 5 days of swelling and pain in the left side of her face, now with a tender lymph node under the left jaw. No eye trouble, no headache. Of note she had a dental cleaning one week ago.    Review of Systems  Constitutional: Negative.   HENT: Positive for facial swelling. Negative for ear pain, postnasal drip, sinus pain, sinus pressure and sore throat.   Eyes: Negative.   Respiratory: Negative.   Hematological: Positive for adenopathy.       Objective:   Physical Exam  Constitutional: She appears well-developed and well-nourished. No distress.  HENT:  Right Ear: External ear normal.  Left Ear: External ear normal.  Nose: Nose normal.  Mouth/Throat: Oropharynx is clear and moist.  She has mild swelling over the left cheek area and she is tender over the left lower jaw. The left lower gums are tender, although the OP appears clear   Eyes: Pupils are equal, round, and reactive to light. Conjunctivae and EOM are normal.  Neck: Neck supple. No thyromegaly present.  Single enlarged tender node under the angle of the left jaw  Pulmonary/Chest: Effort normal and breath sounds normal.          Assessment & Plan:  She seems to have a gingivitis, possibly due to the introduction of bacteria during a recent dental cleaning. Start on Clindamycin 300 mg QID. I advised her to see her dentist soon. Gershon CraneStephen Fry, MD

## 2016-10-08 ENCOUNTER — Encounter: Payer: Self-pay | Admitting: Family Medicine

## 2016-10-09 ENCOUNTER — Encounter (HOSPITAL_BASED_OUTPATIENT_CLINIC_OR_DEPARTMENT_OTHER): Payer: Self-pay

## 2016-10-09 ENCOUNTER — Emergency Department (HOSPITAL_BASED_OUTPATIENT_CLINIC_OR_DEPARTMENT_OTHER)
Admission: EM | Admit: 2016-10-09 | Discharge: 2016-10-09 | Disposition: A | Discharge status: Home / Self Care | Payer: BLUE CROSS/BLUE SHIELD | Attending: Emergency Medicine | Admitting: Emergency Medicine

## 2016-10-09 ENCOUNTER — Telehealth: Payer: Self-pay | Admitting: Family Medicine

## 2016-10-09 ENCOUNTER — Emergency Department (HOSPITAL_BASED_OUTPATIENT_CLINIC_OR_DEPARTMENT_OTHER): Payer: BLUE CROSS/BLUE SHIELD

## 2016-10-09 DIAGNOSIS — E119 Type 2 diabetes mellitus without complications: Secondary | ICD-10-CM | POA: Insufficient documentation

## 2016-10-09 DIAGNOSIS — L040 Acute lymphadenitis of face, head and neck: Secondary | ICD-10-CM | POA: Insufficient documentation

## 2016-10-09 DIAGNOSIS — I10 Essential (primary) hypertension: Secondary | ICD-10-CM | POA: Diagnosis not present

## 2016-10-09 DIAGNOSIS — J45909 Unspecified asthma, uncomplicated: Secondary | ICD-10-CM | POA: Diagnosis not present

## 2016-10-09 DIAGNOSIS — E039 Hypothyroidism, unspecified: Secondary | ICD-10-CM | POA: Insufficient documentation

## 2016-10-09 DIAGNOSIS — I889 Nonspecific lymphadenitis, unspecified: Secondary | ICD-10-CM

## 2016-10-09 DIAGNOSIS — Z79899 Other long term (current) drug therapy: Secondary | ICD-10-CM | POA: Diagnosis not present

## 2016-10-09 DIAGNOSIS — R600 Localized edema: Secondary | ICD-10-CM | POA: Diagnosis present

## 2016-10-09 NOTE — ED Triage Notes (Signed)
C/o swelling to left side of face x 6 days-was seen by PCP-dx with ?abscess salivary gland-abx started-states area is worse and was advised by PCP to go to ED-NAD-steady gait

## 2016-10-09 NOTE — ED Provider Notes (Signed)
[facility] Provider Note   CSN: [id number] Arrival date & time: 10/09/16  1106     History   Chief Complaint Chief Complaint  Patient presents with  . Facial Swelling    HPI Lori Durham is a 66 y.o. female.Chief complaint is pain near my jaw  HPI 66 year old female. States she had some pain and swelling adjacent her jaw. Seen by her physician 4 days ago and placed on clindamycin. States she was told it might be a salivary infection. Her symptoms persist. She called this morning and was directed here. No difficulty with chewing and biting swallowing breathing no swelling into the neck anteriorly or laterally.  Past Medical History:  Diagnosis Date  . Asthma, mild intermittent   . GERD (gastroesophageal reflux disease)    watches diet  . History of colon polyps   . History of diabetes mellitus    no issue since gastric sleeve surgery and lost wt.  . History of exercise stress test    ETT--  06-17-2007--  NORMAL  . History of hypertension    no issue since gastric sleeve surgery and lost wt.  . Hyperlipidemia   . Hypothyroidism, postsurgical   . OSA (obstructive sleep apnea)    MODERATE OSA PER STUDY 09-25-2009--- no longer uses cpap,  no issue since gastric sleeve surgery and lost wt.  . Osteoporosis    last DEXA on 11-20-10 was normal   . PONV (postoperative nausea and vomiting)   . Right knee meniscal tear   . Varicose veins of both lower extremities with pain   . Vitamin D deficiency   . Wears glasses     Patient Active Problem List   Diagnosis Date Noted  . 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 Post-operative state 01/31/2011  . Vaginitis 01/31/2011  . Varicose veins of both lower extremities with pain   . Vitamin D deficiency   . Obesity   . Colon polyps   . MORBID OBESITY 12/05/2009  . HYPERLIPIDEMIA 10/10/2009  . ASTHMA 10/10/2009  . LOW BACK PAIN 10/10/2009  . LEG PAIN 10/10/2009  . OSTEOPOROSIS 10/10/2009  . HYPOTHYROIDISM, POSTSURGICAL  07/13/2008  . HYPERTENSION 07/13/2008  . GERD 07/13/2008  . WEIGHT GAIN 07/13/2008    Past Surgical History:  Procedure Laterality Date  . COLONOSCOPY  12-07-11   [facility] (hx of adenomatous polyps), repeat in 5 yrs   . KNEE ARTHROSCOPY Right 08/10/2014   Procedure: ARTHROSCOPY RIGHT KNEE WITH DEBRIDEMENT;  Surgeon: Durene RomansMatthew Olin, MD;  [facility];  Service: Orthopedics;  Laterality: Right;  . LAPAROSCOPIC CHOLECYSTECTOMY  Nov 2012  [place]  . LAPAROSCOPIC GASTRIC SLEEVE RESECTION  11-15-2009   [place]  . THYROID LOBECTOMY Left 1991   benign  . TONSILLECTOMY AND ADENOIDECTOMY  age 89  . VAGINAL HYSTERECTOMY  1995   w/ ovaries intact    OB History    No data available       Home Medications    Prior to Admission medications   Medication Sig Start Date End Date Taking? Authorizing Provider  acyclovir (ZOVIRAX) 400 MG tablet Take 1 tablet (400 mg total) by mouth as needed. 03/30/16   Nelwyn SalisburyFry, Stephen A, MD  ALPRAZolam Prudy Feeler(XANAX) 0.25 MG tablet Take 1 tablet (0.25 mg total) by mouth 3 (three) times daily as needed for anxiety. 05/14/16   Nelwyn SalisburyFry, Stephen A, MD  BIOTIN PO Take 1 tablet by mouth daily.    [provider]  Calcium-Magnesium-Vitamin D 600-40-500 MG-MG-UNIT TB24 Take by mouth daily.    [provider]  Cholecalciferol (VITAMIN D3) 1000 UNITS CAPS Take 1 capsule by mouth daily.    [provider]  clindamycin (CLEOCIN) 300 MG capsule Take 1 capsule (300 mg total) by mouth 4 (four) times daily. 10/07/16   Nelwyn Salisbury, MD  diclofenac sodium (VOLTAREN) 1 % GEL Apply 4 g topically 3 (three) times daily as needed. 08/27/14   Nelwyn Salisbury, MD  diphenhydrAMINE (BENADRYL) 25 MG tablet Take 25 mg by mouth at bedtime.    [provider]  fish oil-omega-3 fatty acids 1000 MG capsule Take 2 g by mouth daily.    [provider]  Glucosamine-Chondroit-Vit C-Mn (GLUCOSAMINE 1500 COMPLEX PO) Take  2 tablets by mouth daily.    [provider]  liothyronine (CYTOMEL) 5 MCG tablet Take 1 tablet (5 mcg total) by mouth daily. 05/14/16   Nelwyn Salisbury, MD  meclizine (ANTIVERT) 25 MG tablet Take 1 tablet (25 mg total) by mouth every 4 (four) hours as needed for dizziness. 03/08/14   Nelwyn Salisbury, MD  Multiple Vitamin (MULTIVITAMIN) tablet Take 1 tablet by mouth daily.    [provider]  Probiotic Product (ALIGN) 4 MG CAPS Take 1 capsule by mouth daily.    [provider]  raloxifene (EVISTA) 60 MG tablet Take 1 tablet (60 mg total) by mouth daily. 05/14/16   Nelwyn Salisbury, MD  SYNTHROID 125 MCG tablet Take 1 tablet (125 mcg total) by mouth daily. 05/14/16   Nelwyn Salisbury, MD  vitamin B-12 (CYANOCOBALAMIN) 1000 MCG tablet Take 1,000 mcg by mouth daily.    [provider]  Zinc 50 MG CAPS Take 1 capsule by mouth daily.    [provider]    Family History Family History  Problem Relation Age of Onset  . Hypothyroidism Mother   . Heart disease Mother   . Hypertension Mother   . Alcohol abuse Father   . Heart disease Maternal Grandmother   . Alcohol abuse Paternal Grandfather   . Alcohol abuse Brother   . Heart disease Brother   . Heart attack Brother 42  . Alcohol abuse Brother   . Osteoporosis Brother   . Other Brother        varicose veins  . Osteoporosis Brother   . Arthritis Other        family hx of  . Coronary artery disease Other        family history of  . Hyperlipidemia Other        family history of  . Hypertension Other        family history of    Social History Social History  Substance Use Topics  . Smoking status: Never Smoker  . Smokeless tobacco: Never Used  . Alcohol use Yes     Comment: occ     Allergies   Contrast media [iodinated diagnostic agents]; Augmentin [amoxicillin-pot clavulanate]; and Hydrocodone   Review of Systems Review of Systems  Constitutional: Negative for appetite change, chills,  diaphoresis, fatigue and fever.  HENT: Positive for dental problem. Negative for mouth sores, sore throat and trouble swallowing.        Pain and? Soft tissue swelling adjacent left angle of mandible  Eyes: Negative for visual disturbance.  Respiratory: Negative for cough, chest tightness, shortness of breath and wheezing.   Cardiovascular: Negative for chest pain.  Gastrointestinal: Negative for abdominal distention, abdominal pain, diarrhea, nausea  and vomiting.  Endocrine: Negative for polydipsia, polyphagia and polyuria.  Genitourinary: Negative for dysuria, frequency and hematuria.  Musculoskeletal: Negative for gait problem.  Skin: Negative for color change, pallor and rash.  Neurological: Negative for dizziness, syncope, light-headedness and headaches.  Hematological: Does not bruise/bleed easily.  Psychiatric/Behavioral: Negative for behavioral problems and confusion.     Physical Exam Updated Vital Signs BP (!) 188/94 (BP Location: Right Arm)   Pulse 81   Temp 99.5 F (37.5 C) (Oral)   Resp 18   Ht 5\' 6"  (1.676 m)   Wt 112.9 kg (249 lb)   SpO2 100%   BMI 40.19 kg/m   Physical Exam  Constitutional: She is oriented to person, place, and time. She appears well-developed and well-nourished. No distress.  HENT:  Head: Normocephalic.  Tenderness along the angle of the mandible. Palpable lymph node did not is really pathologically enlarged or asymmetric. Normal dentition. Pharynx benign. TMs normal. No rash or vesicles. Normal neck without swelling. Nontender over the parotid  Eyes: Pupils are equal, round, and reactive to light. Conjunctivae are normal. No scleral icterus.  Neck: Normal range of motion. Neck supple. No thyromegaly present.  Cardiovascular: Normal rate and regular rhythm.  Exam reveals no gallop and no friction rub.   No murmur heard. Pulmonary/Chest: Effort normal and breath sounds normal. No respiratory distress. She has no wheezes. She has no rales.    Abdominal: Soft. Bowel sounds are normal. She exhibits no distension. There is no tenderness. There is no rebound.  Musculoskeletal: Normal range of motion.  Neurological: She is alert and oriented to person, place, and time.  Skin: Skin is warm and dry. No rash noted.  Psychiatric: She has a normal mood and affect. Her behavior is normal.     ED Treatments / Results  Labs (all labs ordered are listed, but only abnormal results are displayed) Labs Reviewed - No data to display  EKG  EKG Interpretation None       Radiology Ct Soft Tissue Neck Wo Contrast  Result Date: 10/09/2016 CLINICAL DATA:  66 year old female with painful left neck mass discovered 1 week ago, not improving despite recent antibiotics. EXAM: CT NECK WITHOUT CONTRAST TECHNIQUE: Multidetector CT imaging of the neck was performed following the standard protocol without intravenous contrast. COMPARISON:  Neck CT 12/28/2013. FINDINGS: Pharynx and larynx: Laryngeal and pharyngeal soft tissue contours are stable and within normal limits. Negative parapharyngeal spaces. There is a small chronic metallic foreign body along the left lateral para laryngeal space (series 4, image 60) which is unchanged. Negative retropharyngeal space. Salivary glands: Negative noncontrast sublingual space, submandibular glands, and parotid glands. The marked area of clinical concern corresponds to the left lower parotid space near the left angle of the mandible on series 4, image 36. No glandular or regional inflammation, and no left parotid mass or parotid space lymphadenopathy is identified. No sialolithiasis identified. The left parotid duct appears nondilated and normal. Thyroid: Diminutive. The left thyroid lobe appears surgically absent, with a stable small surgical clip at the left thyroid bed. Lymph nodes: Bilateral cervical lymph nodes are stable since 2015 and within normal limits. In general the bilateral cervical nodes are diminutive,  although they are somewhat chronically increased in number. The largest nodes measure 7-8 mm short axis at the right level 3 station. Vascular: Calcified carotid atherosclerosis in the neck. Calcified atherosclerosis at the skull base. Vascular patency is not evaluated in the absence of IV contrast. Limited intracranial: Negative. Visualized orbits: Negative.  Mastoids and visualized paranasal sinuses: Visualized paranasal sinuses and mastoids are stable and well pneumatized. Bilateral tympanic cavities are clear. Skeleton: Chronic left side degenerative cervical facet arthropathy at C2-C3. Mild to moderate left facet degeneration at C3-C4. Chronic lower cervical disc and endplate degeneration. Mild to moderate chronic right TMJ degeneration. No acute osseous abnormality identified. Upper chest: Stable an negative visualized superior mediastinum. Visible axillary lymph nodes are normal. Negative lung apices. IMPRESSION: Area of concern corresponds to the left lower parotid space, but no mass, lymphadenopathy, or abnormality is identified. The CT appearance of the neck is stable since October 2015 and within normal limits. Electronically Signed   By: Odessa Fleming M.D.   On: 10/09/2016 13:17    Procedures Procedures (including critical care time)  Medications Ordered in ED Medications - No data to display   Initial Impression / Assessment and Plan / ED Course  I have reviewed the triage vital signs and the nursing notes.  Pertinent labs & imaging results that were available during my care of the patient were reviewed by me and considered in my medical decision making (see chart for details).     CT scan unrevealing. Some lymph nodes but none pathologically enlarged for this as noncontrast because of an allergy. No pathological dilatation of salivary glands. Essentially no acute findings. Patient encouraged to continue her clindamycin as directed by her primary care physician and to follow-up with her  primary care physician  Final Clinical Impressions(s) / ED Diagnoses   Final diagnoses:  Cervical adenitis    New Prescriptions New Prescriptions   No medications on file     Rolland Porter, MD 10/09/16 1336

## 2016-10-09 NOTE — Discharge Instructions (Signed)
Finish your clindamycin as scheduled.  Follow-up with your physician if not resolved prior to completion of antibiotic course.

## 2016-10-09 NOTE — Telephone Encounter (Signed)
Spoke with pt and advised. [facility]. Nothing further needed at this time.

## 2016-10-09 NOTE — Telephone Encounter (Signed)
She should be getting better on the antibiotic, so I am concerned she may have an abscess. Have her go to the ER where they can possibly get a CT scan of the area if needed or consult ENT

## 2016-10-09 NOTE — Telephone Encounter (Signed)
Triage nurse Zenon MayoSheena spoke with pt earlier, from a previous note and advised pt to go to ER for evaluation, see previous phone note.

## 2016-10-09 NOTE — Telephone Encounter (Signed)
Pt state that the lymph node on the L side of her neck is the size of goose egg and very sore and painful and want to know what she needs to do.

## 2016-10-14 ENCOUNTER — Encounter: Payer: Self-pay | Admitting: Family Medicine

## 2016-10-14 NOTE — Telephone Encounter (Signed)
See me again later this week to re-evaluate

## 2016-10-23 ENCOUNTER — Ambulatory Visit: Payer: BLUE CROSS/BLUE SHIELD | Admitting: Family Medicine

## 2016-10-30 ENCOUNTER — Ambulatory Visit (INDEPENDENT_AMBULATORY_CARE_PROVIDER_SITE_OTHER): Payer: BLUE CROSS/BLUE SHIELD | Admitting: Family Medicine

## 2016-10-30 ENCOUNTER — Encounter: Payer: Self-pay | Admitting: Family Medicine

## 2016-10-30 VITALS — BP 158/84 | Temp 99.1°F | Ht 66.0 in | Wt 251.0 lb

## 2016-10-30 DIAGNOSIS — R6884 Jaw pain: Secondary | ICD-10-CM

## 2016-10-30 LAB — CBC WITH DIFFERENTIAL/PLATELET
BASOS PCT: 0.5 % (ref 0.0–3.0)
Basophils Absolute: 0 10*3/uL (ref 0.0–0.1)
EOS PCT: 6.1 % — AB (ref 0.0–5.0)
Eosinophils Absolute: 0.4 10*3/uL (ref 0.0–0.7)
HEMATOCRIT: 42.9 % (ref 36.0–46.0)
HEMOGLOBIN: 14.6 g/dL (ref 12.0–15.0)
Lymphocytes Relative: 41.1 % (ref 12.0–46.0)
Lymphs Abs: 2.6 10*3/uL (ref 0.7–4.0)
MCHC: 34.1 g/dL (ref 30.0–36.0)
MCV: 106.6 fl — AB (ref 78.0–100.0)
MONOS PCT: 7.9 % (ref 3.0–12.0)
Monocytes Absolute: 0.5 10*3/uL (ref 0.1–1.0)
Neutro Abs: 2.9 10*3/uL (ref 1.4–7.7)
Neutrophils Relative %: 44.4 % (ref 43.0–77.0)
Platelets: 232 10*3/uL (ref 150.0–400.0)
RBC: 4.02 Mil/uL (ref 3.87–5.11)
RDW: 12.2 % (ref 11.5–15.5)
WBC: 6.4 10*3/uL (ref 4.0–10.5)

## 2016-10-30 LAB — BASIC METABOLIC PANEL
BUN: 17 mg/dL (ref 6–23)
CHLORIDE: 103 meq/L (ref 96–112)
CO2: 31 mEq/L (ref 19–32)
Calcium: 9.3 mg/dL (ref 8.4–10.5)
Creatinine, Ser: 0.69 mg/dL (ref 0.40–1.20)
GFR: 91.09 mL/min (ref >60.00)
Glucose, Bld: 88 mg/dL (ref 70–99)
POTASSIUM: 4.1 meq/L (ref 3.5–5.1)
SODIUM: 139 meq/L (ref 135–145)

## 2016-10-30 MED ORDER — LEVOFLOXACIN 500 MG PO TABS: 500.0000 mg | ORAL_TABLET | Freq: Every day | ORAL | 0 refills | Status: DC

## 2016-10-30 NOTE — Patient Instructions (Signed)
WE NOW OFFER   [facility] FAST TRACK!!!  SAME DAY Appointments for ACUTE CARE  Such as: Sprains, Injuries, cuts, abrasions, rashes, muscle pain, joint pain, back pain Colds, flu, sore throats, headache, allergies, cough, fever  Ear pain, sinus and eye infections Abdominal pain, nausea, vomiting, diarrhea, upset stomach Animal/insect bites  3 Easy Ways to Schedule: Walk-In Scheduling Call in scheduling Mychart Sign-up: [email or web address]         

## 2016-10-30 NOTE — Progress Notes (Signed)
   Subjective:    Patient ID: Lori [facility], female    DOB: December 13, 1950, 66 y.o.   MRN: [id number]  HPI Here to follow up 5 weeks of swelling and mild pain in the left jaw area with low grade fevers. She saw us on 10-07-16 for this and suspected possible gingival infection. She was started on Clindamycin. When this did not help her improve, she was sent to the ER on 10-09-16. Her workup there was unrevealing, including a non-contrasted head CT scan. She was ruled out for abscesses, etc, and she was told to stay on the Clindamycin. She finished a 10 day course of this, but she is still no better at all. She still has tenderness and swelling around the lower jaw, and she has fevers of 99.0 to 99.2 degrees most of the time. No trouble swallowing or speaking.    Review of Systems  Constitutional: Positive for fever.  HENT: Positive for facial swelling. Negative for dental problem, ear pain, mouth sores, nosebleeds, sore throat, trouble swallowing and voice change.   Eyes: Negative.   Respiratory: Negative.        Objective:   Physical Exam  Constitutional: She appears well-developed. No distress.  HENT:  Left Ear: External ear normal.  Nose: Nose normal.  Mouth/Throat: Oropharynx is clear and moist.  Eyes: Conjunctivae are normal.  Neck: Neck supple. No thyromegaly present.  Tender and mildly swollen just inferior to and posterior to the left mandibular angle.           Assessment & Plan:  Neck swelling, tenderness and fever of uncertain etiology. She will start on Levaquin 500 mg daily and we will refer her to ENT to evaluate.  Gershon CraneStephen Naythen Heikkila, MD

## 2016-11-04 ENCOUNTER — Encounter: Payer: Self-pay | Admitting: Family Medicine

## 2016-11-05 ENCOUNTER — Encounter: Payer: Self-pay | Admitting: Family Medicine

## 2016-11-05 ENCOUNTER — Ambulatory Visit (INDEPENDENT_AMBULATORY_CARE_PROVIDER_SITE_OTHER): Payer: BLUE CROSS/BLUE SHIELD | Admitting: Family Medicine

## 2016-11-05 VITALS — BP 154/82 | HR 81 | Temp 98.4°F | Ht 66.0 in | Wt 254.6 lb

## 2016-11-05 DIAGNOSIS — I10 Essential (primary) hypertension: Secondary | ICD-10-CM

## 2016-11-05 DIAGNOSIS — R197 Diarrhea, unspecified: Secondary | ICD-10-CM

## 2016-11-05 NOTE — Patient Instructions (Signed)
Please stop by lab before you go  If you cannot provide a stool sample they can give you a collection container and you can collect at home.   Will await lab results  Most important thing right now is to work hard to remain well hydrated

## 2016-11-05 NOTE — Progress Notes (Signed)
Subjective:  Lori Durham is a 66 y.o. year old very pleasant female patient who presents for/with See problem oriented charting ROS- no nausea, vomiting, fever. Does feel fatigued.   Past Medical History-  Patient Active Problem List   Diagnosis Date Noted  . 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 Post-operative state 01/31/2011  . Vaginitis 01/31/2011  . Varicose veins of both lower extremities with pain   . Vitamin D deficiency   . Obesity   . Colon polyps   . MORBID OBESITY 12/05/2009  . HYPERLIPIDEMIA 10/10/2009  . ASTHMA 10/10/2009  . LOW BACK PAIN 10/10/2009  . LEG PAIN 10/10/2009  . OSTEOPOROSIS 10/10/2009  . 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 HYPERTENSION 07/13/2008  . GERD 07/13/2008  . WEIGHT GAIN 07/13/2008    Medications- reviewed and updated Current Outpatient Prescriptions  Medication Sig Dispense Refill  . acyclovir (ZOVIRAX) 400 MG tablet Take 1 tablet (400 mg total) by mouth as needed. 60 tablet 3  . ALPRAZolam (XANAX) 0.25 MG tablet Take 1 tablet (0.25 mg total) by mouth 3 (three) times daily as needed for anxiety. 90 tablet 5  . BIOTIN PO Take 1 tablet by mouth daily.    . Calcium-Magnesium-Vitamin D 600-40-500 MG-MG-UNIT TB24 Take by mouth daily.    . Cholecalciferol (VITAMIN D3) 1000 UNITS CAPS Take 1 capsule by mouth daily.    . diclofenac sodium (VOLTAREN) 1 % GEL Apply 4 g topically 3 (three) times daily as needed. 300 g 5  . diphenhydrAMINE (BENADRYL) 25 MG tablet Take 25 mg by mouth at bedtime.    . fish oil-omega-3 fatty acids 1000 MG capsule Take 2 g by mouth daily.    . Glucosamine-Chondroit-Vit C-Mn (GLUCOSAMINE 1500 COMPLEX PO) Take 2 tablets by mouth daily.    Marland Kitchen. levofloxacin (LEVAQUIN) 500 MG tablet Take 1 tablet (500 mg total) by mouth daily. 10 tablet 0  . liothyronine (CYTOMEL) 5 MCG tablet Take 1 tablet (5 mcg total) by mouth daily. 90 tablet 3  . meclizine (ANTIVERT) 25 MG tablet Take 1 tablet (25 mg total) by mouth every 4 (four) hours as  needed for dizziness. 100 tablet 3  . Multiple Vitamin (MULTIVITAMIN) tablet Take 1 tablet by mouth daily.    . Probiotic Product (ALIGN) 4 MG CAPS Take 1 capsule by mouth daily.    . raloxifene (EVISTA) 60 MG tablet Take 1 tablet (60 mg total) by mouth daily. 90 tablet 3  . SYNTHROID 125 MCG tablet Take 1 tablet (125 mcg total) by mouth daily. 90 tablet 3  . vitamin B-12 (CYANOCOBALAMIN) 1000 MCG tablet Take 1,000 mcg by mouth daily.    . Zinc 50 MG CAPS Take 1 capsule by mouth daily.     No current facility-administered medications for this visit.     Objective: BP (!) 154/82 (BP Location: Left Arm, Patient Position: Sitting, Cuff Size: Large)   Pulse 81   Temp 98.4 F (36.9 C) (Oral)   Ht 5\' 6"  (1.676 m)   Wt 254 lb 9.6 oz (115.5 kg)   SpO2 96%   BMI 41.09 kg/m  Gen: NAD, resting comfortably Mucous membranes are moist. CV: RRR no murmurs rubs or gallops Lungs: CTAB no crackles, wheeze, rhonchi Abdomen: soft/Mild diffuse tenderness/nondistended/normal bowel sounds. No rebound or guarding. Obese Ext: no edema Skin: warm, dry  Assessment/Plan:  Diarrhea of presumed infectious origin - Plan: Clostridium Difficile by PCR, Fecal lactoferrin, quant, Stool culture S: Saw Dr. Jenne PaneBates today. Stopped levaquin that was started last Friday. She has  had diarrhea since Sunday after starting antibiotic. Now 4 days of symptoms. Had Er visit about a month ago and was on clindamycin for a period as well. Dr. Jenne Pane was very concerned about C. difficile.  Diarrhea at least 10x a day and very inflammed near rectum/raw. Watery diarrhea. No nausea or vomiting. Is fatigued. Weigh is up a few lbs despite reported diarrhea. No blood in the stool. Sometimes slight blood from wiping where she is raw A/P: Potential C. difficile with recent antibiotic exposure and new-onset diarrhea. Advised her to stay out of work 24 hours after last episode of diarrhea. Will also get stool cultures. If C. difficile is  negative but fecal lactoferrin is positive would consider course of ciprofloxacin likely for 3 days. Strongly stressed the importance of remaining well-hydrated. Seems to be doing a good job of this so I did not hold her losartan  Hypertension-compliant with losartan. She reports home #s 128/84 within last day. Informs me she is always elevated in the office BP Readings from Last 3 Encounters:  11/05/16 (!) 154/82  10/30/16 (!) 158/84  10/09/16 (!) 152/75   Orders Placed This Encounter  Procedures  . Clostridium Difficile by PCR    Order Specific Question:   Is your patient experiencing loose or watery stools (3 or more in 24 hours)?    Answer:   Yes    Order Specific Question:   Has the patient received laxatives in the last 24 hours?    Answer:   No    Order Specific Question:   Has a negative Cdiff test resulted in the last 7 days?    Answer:   No  . Stool culture  . Fecal lactoferrin, quant   Return precautions advised.  Tana Conch, MD

## 2016-11-07 LAB — CLOSTRIDIUM DIFFICILE BY PCR: Toxigenic C. Difficile by PCR: NOT DETECTED

## 2016-11-09 LAB — STOOL CULTURE

## 2016-11-09 LAB — FECAL LACTOFERRIN, QUANT: Lactoferrin: NEGATIVE

## 2016-11-10 ENCOUNTER — Encounter: Payer: Self-pay | Admitting: Family Medicine

## 2016-11-20 ENCOUNTER — Other Ambulatory Visit: Payer: Self-pay | Admitting: Family Medicine

## 2016-11-20 NOTE — Telephone Encounter (Signed)
Call in #90 with 5 rf 

## 2016-12-10 ENCOUNTER — Encounter: Payer: Self-pay | Admitting: Family Medicine

## 2017-01-14 ENCOUNTER — Encounter: Payer: Self-pay | Admitting: Family Medicine

## 2017-03-17 ENCOUNTER — Other Ambulatory Visit: Payer: Self-pay | Admitting: Family Medicine

## 2017-03-18 NOTE — Telephone Encounter (Signed)
Last OV 10/30/2016. TSH was last tested 05/08/2016. Rx was last refilled 05/14/2016 for 90 days with 3 refills.

## 2017-06-04 ENCOUNTER — Encounter: Payer: BLUE CROSS/BLUE SHIELD | Admitting: Family Medicine

## 2017-06-10 ENCOUNTER — Encounter: Payer: Self-pay | Admitting: Family Medicine

## 2017-06-10 ENCOUNTER — Ambulatory Visit (INDEPENDENT_AMBULATORY_CARE_PROVIDER_SITE_OTHER): Payer: 59 | Admitting: Family Medicine

## 2017-06-10 VITALS — BP 136/80 | HR 81 | Temp 98.7°F | Ht 64.5 in | Wt 252.6 lb

## 2017-06-10 DIAGNOSIS — Z0000 Encounter for general adult medical examination without abnormal findings: Secondary | ICD-10-CM | POA: Diagnosis not present

## 2017-06-10 DIAGNOSIS — E890 Postprocedural hypothyroidism: Secondary | ICD-10-CM

## 2017-06-10 LAB — POC URINALSYSI DIPSTICK (AUTOMATED)
Bilirubin, UA: NEGATIVE
Blood, UA: NEGATIVE
Glucose, UA: NEGATIVE
KETONES UA: NEGATIVE
NITRITE UA: NEGATIVE
PH UA: 7 (ref 5.0–8.0)
PROTEIN UA: NEGATIVE
Spec Grav, UA: 1.015 (ref 1.010–1.025)
Urobilinogen, UA: 0.2 E.U./dL

## 2017-06-10 LAB — HEPATIC FUNCTION PANEL
ALBUMIN: 4.4 g/dL (ref 3.5–5.2)
ALK PHOS: 73 U/L (ref 39–117)
ALT: 22 U/L (ref 0–35)
AST: 22 U/L (ref 0–37)
Bilirubin, Direct: 0.1 mg/dL (ref 0.0–0.3)
Total Bilirubin: 0.7 mg/dL (ref 0.2–1.2)
Total Protein: 6.7 g/dL (ref 6.0–8.3)

## 2017-06-10 LAB — CBC WITH DIFFERENTIAL/PLATELET
Basophils Absolute: 0 10*3/uL (ref 0.0–0.1)
Basophils Relative: 0.5 % (ref 0.0–3.0)
EOS PCT: 4.9 % (ref 0.0–5.0)
Eosinophils Absolute: 0.3 10*3/uL (ref 0.0–0.7)
HCT: 42.5 % (ref 36.0–46.0)
HEMOGLOBIN: 14.3 g/dL (ref 12.0–15.0)
LYMPHS ABS: 1.9 10*3/uL (ref 0.7–4.0)
Lymphocytes Relative: 36.5 % (ref 12.0–46.0)
MCHC: 33.7 g/dL (ref 30.0–36.0)
MCV: 105.8 fl — ABNORMAL HIGH (ref 78.0–100.0)
Monocytes Absolute: 0.4 10*3/uL (ref 0.1–1.0)
Monocytes Relative: 7.9 % (ref 3.0–12.0)
NEUTROS PCT: 50.2 % (ref 43.0–77.0)
Neutro Abs: 2.7 10*3/uL (ref 1.4–7.7)
Platelets: 235 10*3/uL (ref 150.0–400.0)
RBC: 4.01 Mil/uL (ref 3.87–5.11)
RDW: 12.6 % (ref 11.5–15.5)
WBC: 5.3 10*3/uL (ref 4.0–10.5)

## 2017-06-10 LAB — BASIC METABOLIC PANEL
BUN: 15 mg/dL (ref 6–23)
CHLORIDE: 102 meq/L (ref 96–112)
CO2: 27 mEq/L (ref 19–32)
CREATININE: 0.69 mg/dL (ref 0.40–1.20)
Calcium: 9.3 mg/dL (ref 8.4–10.5)
GFR: 90.91 mL/min (ref >60.00)
Glucose, Bld: 110 mg/dL — ABNORMAL HIGH (ref 70–99)
POTASSIUM: 4.1 meq/L (ref 3.5–5.1)
SODIUM: 141 meq/L (ref 135–145)

## 2017-06-10 LAB — T4, FREE: Free T4: 0.94 ng/dL (ref 0.60–1.60)

## 2017-06-10 LAB — LIPID PANEL
CHOL/HDL RATIO: 3
Cholesterol: 193 mg/dL (ref 0–200)
HDL: 67 mg/dL (ref >39.00)
LDL Cholesterol: 90 mg/dL (ref 0–99)
NonHDL: 125.57
Triglycerides: 179 mg/dL — ABNORMAL HIGH (ref 0.0–149.0)
VLDL: 35.8 mg/dL (ref 0.0–40.0)

## 2017-06-10 LAB — T3, FREE: T3, Free: 3.5 pg/mL (ref 2.3–4.2)

## 2017-06-10 LAB — TSH: TSH: 2.53 u[IU]/mL (ref 0.35–4.50)

## 2017-06-10 MED ORDER — ALPRAZOLAM 0.25 MG PO TABS
0.5000 mg | ORAL_TABLET | Freq: Every day | ORAL | 5 refills | Status: DC
Start: 2017-06-10 — End: 2018-06-13

## 2017-06-10 MED ORDER — RALOXIFENE HCL 60 MG PO TABS: 60.0000 mg | ORAL_TABLET | Freq: Every day | ORAL | 3 refills | Status: DC

## 2017-06-10 MED ORDER — MELOXICAM 15 MG PO TABS: 15.0000 mg | ORAL_TABLET | Freq: Every day | ORAL | 3 refills | Status: DC

## 2017-06-10 MED ORDER — LIOTHYRONINE SODIUM 5 MCG PO TABS: 5.0000 ug | ORAL_TABLET | Freq: Every day | ORAL | 3 refills | Status: DC

## 2017-06-10 MED ORDER — SYNTHROID 125 MCG PO TABS: 125.0000 ug | ORAL_TABLET | Freq: Every day | ORAL | 3 refills | Status: DC

## 2017-06-10 MED ORDER — OMEPRAZOLE 20 MG PO CPDR: 20.0000 mg | DELAYED_RELEASE_CAPSULE | Freq: Every day | ORAL | 3 refills | Status: DC

## 2017-06-10 NOTE — Progress Notes (Signed)
   Subjective:    Patient ID: [name], female    DOB: Sep 30, 1950, [age] y.o.   MRN: [id number]  HPI Here for a well exam. She has a few issues going on. She is concerned about her weight. Of course she had the gastric bypass in 2011. She has arthritic pain in a lot of her joints however, and this make sit very difficult to exercise. She gets no relief with Tylenol. Her bariatric surgeon told her to never take NSAIDs, but I think we need to try to reduce her pain levels if possible. She is past due for a DEXA and for a colonoscopy, but she wants to wait on these until later this year. Her insurance will change to Medicare so she plans to take care of them then. She is seeing her dentist for painful and hypertrophied jaw muscles. She has clenched her teeth at night for years and she wears a bite guard at night.   Review of Systems  Constitutional: Negative.   HENT: Negative.   Eyes: Negative.   Respiratory: Negative.   Cardiovascular: Negative.   Gastrointestinal: Negative.   Genitourinary: Negative for decreased urine volume, difficulty urinating, dyspareunia, dysuria, enuresis, flank pain, frequency, hematuria, pelvic pain and urgency.  Musculoskeletal: Positive for arthralgias. Negative for joint swelling.  Skin: Negative.   Neurological: Negative.   Psychiatric/Behavioral: Negative.        Objective:   Physical Exam  Constitutional: She is oriented to person, place, and time. She appears well-developed. No distress.  Morbidly obese   HENT:  Head: Normocephalic and atraumatic.  Right Ear: External ear normal.  Left Ear: External ear normal.  Nose: Nose normal.  Mouth/Throat: Oropharynx is clear and moist. No oropharyngeal exudate.  Eyes: Pupils are equal, round, and reactive to light. Conjunctivae and EOM are normal. No scleral icterus.  Neck: Normal range of motion. Neck supple. No JVD present. No thyromegaly present.  Cardiovascular: Normal rate, regular rhythm, normal heart  sounds and intact distal pulses. Exam reveals no gallop and no friction rub.  No murmur heard. Pulmonary/Chest: Effort normal and breath sounds normal. No respiratory distress. She has no wheezes. She has no rales. She exhibits no tenderness.  Abdominal: Soft. Bowel sounds are normal. She exhibits no distension and no mass. There is no tenderness. There is no rebound and no guarding.  Musculoskeletal: Normal range of motion. She exhibits no edema or tenderness.  Lymphadenopathy:    She has no cervical adenopathy.  Neurological: She is alert and oriented to person, place, and time. She has normal reflexes. No cranial nerve deficit. She exhibits normal muscle tone. Coordination normal.  Skin: Skin is warm and dry. No rash noted. No erythema.  Psychiatric: She has a normal mood and affect. Her behavior is normal. Judgment and thought content normal.          Assessment & Plan:  Well exam. We discussed diet and exercise. She has osteoarthritis so she will try Meloxicam 15 mg daily. Hopefully with les joint pain she can become more active and get more exercise. She will protect her stomach by taking Prilosec 20 mg daily. Get fasting labs. For the night time clenching she will increase the bedtime Xanax to 2 tabs (0.5 mg).  Gershon CraneStephen Meilani Edmundson, MD

## 2017-06-28 ENCOUNTER — Ambulatory Visit: Payer: 59 | Admitting: Family Medicine

## 2017-06-28 ENCOUNTER — Encounter: Payer: Self-pay | Admitting: Family Medicine

## 2017-06-28 VITALS — BP 150/82 | HR 84 | Temp 98.6°F | Ht 64.5 in | Wt 254.6 lb

## 2017-06-28 DIAGNOSIS — J209 Acute bronchitis, unspecified: Secondary | ICD-10-CM | POA: Diagnosis not present

## 2017-06-28 MED ORDER — FLUCONAZOLE 150 MG PO TABS: 150.0000 mg | ORAL_TABLET | Freq: Once | ORAL | 5 refills | Status: DC

## 2017-06-28 MED ORDER — CLARITHROMYCIN 500 MG PO TABS: 500.0000 mg | ORAL_TABLET | Freq: Two times a day (BID) | ORAL | 0 refills | Status: DC

## 2017-06-28 MED ORDER — HYDROCOD POLST-CPM POLST ER 10-8 MG/5ML PO SUER: 5.0000 mL | Freq: Two times a day (BID) | ORAL | 0 refills | Status: DC | PRN

## 2017-06-28 NOTE — Progress Notes (Signed)
   Subjective:    Patient ID: Lori Durham, female  Lori Durham  DOB: 04/14/1950, 67 y.o.   MRN: [id number]  HPI Here for 3 days of chest tightness and coughing up green sputum. Drinking fluids.    Review of Systems  Constitutional: Negative.   HENT: Positive for congestion. Negative for postnasal drip, sinus pressure, sinus pain and sore throat.   Eyes: Negative.   Respiratory: Positive for cough and chest tightness.        Objective:   Physical Exam  Constitutional: She appears well-developed and well-nourished.  HENT:  Right Ear: External ear normal.  Left Ear: External ear normal.  Nose: Nose normal.  Mouth/Throat: Oropharynx is clear and moist.  Eyes: Conjunctivae are normal.  Neck: No thyromegaly present.  Pulmonary/Chest: Effort normal. No respiratory distress. She has no wheezes. She has no rales.  Scattered rhonchi   Lymphadenopathy:    She has no cervical adenopathy.          Assessment & Plan:  Bronchitis, treat with Biaxin. Written out of work today.  Gershon CraneStephen Ann-Marie Kluge, MD

## 2017-06-30 ENCOUNTER — Ambulatory Visit: Payer: Self-pay | Admitting: *Deleted

## 2017-06-30 NOTE — Telephone Encounter (Signed)
For review

## 2017-06-30 NOTE — Telephone Encounter (Signed)
Patient was seen Monday for cough- was given cough medication and antibiotic. Patient states her cough is so violent that she can't sleep. She has started wheezing and rattling. Patient got desperate today- and she took 2 xanax, cough medication and antihistamine. Offered appointment today- but patient has no one to drive her- she asked if Dr Clent RidgesFry will call something different in- will send message- appointment made for 9:45 in morning. Patient advised she can go to [facility] if she gets worse tonight.  Reason for Disposition . Wheezing is present  Answer Assessment - Initial Assessment Questions 1. ONSET: "When did the cough begin?"      Since last Friday 2. SEVERITY: "How bad is the cough today?"      Very violent- patient is concerned that she may have consequences. 3. RESPIRATORY DISTRESS: "Describe your breathing."      Patient is labored- patient does not feel like she is getting enough air 4. FEVER: "Do you have a fever?" If so, ask: "What is your temperature, how was it measured, and when did it start?"     Patient feels warm- but she has not taken it 5. SPUTUM: "Describe the color of your sputum" (clear, white, yellow, green)     yellow 6. HEMOPTYSIS: "Are you coughing up any blood?" If so ask: "How much?" (flecks, streaks, tablespoons, etc.)     no 7. CARDIAC HISTORY: "Do you have any history of heart disease?" (e.g., heart attack, congestive heart failure)      no 8. LUNG HISTORY: "Do you have any history of lung disease?"  (e.g., pulmonary embolus, asthma, emphysema)     no 9. PE RISK FACTORS: "Do you have a history of blood clots?" (or: recent major surgery, recent prolonged travel, bedridden )     no 10. OTHER SYMPTOMS: "Do you have any other symptoms?" (e.g., runny nose, wheezing, chest pain)       Wheezing, chest pian with coughing, runny nose 11. PREGNANCY: "Is there any chance you are pregnant?" "When was your last menstrual period?"       N/a  12. TRAVEL: "Have you traveled  out of the country in the last month?" (e.g., travel history, exposures)       n/a  Protocols used: COUGH - ACUTE PRODUCTIVE-A-AH

## 2017-07-01 ENCOUNTER — Ambulatory Visit: Payer: 59 | Admitting: Family Medicine

## 2017-07-01 MED ORDER — METHYLPREDNISOLONE 4 MG PO TBPK: ORAL_TABLET | ORAL | 0 refills | Status: DC

## 2017-07-01 NOTE — Telephone Encounter (Signed)
I spoke with pt and sent in script to [facility], pt would like to know should she continue with antibiotic biaxin?

## 2017-07-01 NOTE — Telephone Encounter (Signed)
Call in a Medrol dose pack  

## 2017-07-01 NOTE — Addendum Note (Signed)
Addended by: Aniceto BossNIMMONS, SYLVIA A on: 07/01/2017 08:35 AM   Modules accepted: Orders

## 2017-07-01 NOTE — Telephone Encounter (Signed)
I spoke with pt and gave message.

## 2017-07-01 NOTE — Telephone Encounter (Signed)
Yes , stay on the current meds

## 2017-07-05 ENCOUNTER — Encounter: Payer: Self-pay | Admitting: Family Medicine

## 2017-07-05 ENCOUNTER — Ambulatory Visit: Payer: Self-pay

## 2017-07-05 ENCOUNTER — Ambulatory Visit (INDEPENDENT_AMBULATORY_CARE_PROVIDER_SITE_OTHER)
Admission: RE | Admit: 2017-07-05 | Discharge: 2017-07-05 | Disposition: A | Discharge status: Home / Self Care | Payer: 59 | Source: Ambulatory Visit | Attending: Family Medicine | Admitting: Family Medicine

## 2017-07-05 ENCOUNTER — Ambulatory Visit: Payer: 59 | Admitting: Family Medicine

## 2017-07-05 VITALS — BP 158/86 | HR 85 | Temp 98.9°F | Ht 64.5 in | Wt 251.0 lb

## 2017-07-05 DIAGNOSIS — J181 Lobar pneumonia, unspecified organism: Secondary | ICD-10-CM

## 2017-07-05 DIAGNOSIS — J189 Pneumonia, unspecified organism: Secondary | ICD-10-CM

## 2017-07-05 MED ORDER — CEFTRIAXONE SODIUM 1 G IJ SOLR
1.0000 g | Freq: Once | INTRAMUSCULAR | Status: AC
  Administered 2017-07-05: 1 g via INTRAMUSCULAR

## 2017-07-05 MED ORDER — LEVOFLOXACIN 500 MG PO TABS: 500.0000 mg | ORAL_TABLET | Freq: Every day | ORAL | 0 refills | Status: AC

## 2017-07-05 MED ORDER — HYDROCODONE-HOMATROPINE 5-1.5 MG/5ML PO SYRP: 5.0000 mL | ORAL_SOLUTION | ORAL | 0 refills | Status: DC | PRN

## 2017-07-05 NOTE — Telephone Encounter (Signed)
Pt. Reports she is on antibiotics for bronchitis, but has increased shortness of breath. "I'm afraid I could get pneumonia." Reports she took her Xanax this weekend so she could sleep. Denies fever or chest pain. Reports feeling "very weak." Appointment for today.Instructed if she feels worse before [facility]. Verbalizes understanding.   Reason for Disposition . [1] MODERATE longstanding difficulty breathing (e.g., speaks in phrases, SOB even at rest, pulse 100-120) AND [2] SAME as normal  Answer Assessment - Initial Assessment Questions 1. RESPIRATORY STATUS: "Describe your breathing?" (e.g., wheezing, shortness of breath, unable to speak, severe coughing)      Short of breath 2. ONSET: "When did this breathing problem begin?"      This weekend 3. PATTERN "Does the difficult breathing come and go, or has it been constant since it started?"      Constant 4. SEVERITY: "How bad is your breathing?" (e.g., mild, moderate, severe)    - MILD: No SOB at rest, mild SOB with walking, speaks normally in sentences, can lay down, no retractions, pulse < 100.    - MODERATE: SOB at rest, SOB with minimal exertion and prefers to sit, cannot lie down flat, speaks in phrases, mild retractions, audible wheezing, pulse 100-120.    - SEVERE: Very SOB at rest, speaks in single words, struggling to breathe, sitting hunched forward, retractions, pulse > 120      Moderate 5. RECURRENT SYMPTOM: "Have you had difficulty breathing before?" If so, ask: "When was the last time?" and "What happened that time?"      No 6. CARDIAC HISTORY: "Do you have any history of heart disease?" (e.g., heart attack, angina, bypass surgery, angioplasty)      No 7. LUNG HISTORY: "Do you have any history of lung disease?"  (e.g., pulmonary embolus, asthma, emphysema)     Bronchitis 8. CAUSE: "What do you think is causing the breathing problem?"      Maybe pneumonia 9. OTHER SYMPTOMS: "Do you have any other  symptoms? (e.g., dizziness, runny nose, cough, chest pain, fever)     Cough, very weak, back pain 10. PREGNANCY: "Is there any chance you are pregnant?" "When was your last menstrual period?"       No 11. TRAVEL: "Have you traveled out of the country in the last month?" (e.g., travel history, exposures)       No  Protocols used: BREATHING DIFFICULTY-A-AH

## 2017-07-05 NOTE — Telephone Encounter (Signed)
She was seen OV today  

## 2017-07-05 NOTE — Addendum Note (Signed)
Addended by: Gracelyn NurseBLACKWELL, Teagon Kron P on: 07/05/2017 12:46 PM   Modules accepted: Orders

## 2017-07-05 NOTE — Progress Notes (Signed)
   Subjective:    Patient ID: Lori Durham, female    DOB: 11-28-1950, 67 y.o.   MRN: [id number]  HPI Here to follow up on chest congestion and coughing. She was here on 06-28-17 and was given a course of Biaxin and a steroid shot. However she has gotten a little worse since then. The cough now produces a dark yellow sputum, she is wheezing, and she gets SOB very easily. No fever.   Review of Systems  Constitutional: Negative.   HENT: Positive for congestion. Negative for sinus pressure, sinus pain and sore throat.   Eyes: Negative.   Respiratory: Positive for cough, chest tightness, shortness of breath and wheezing.   Cardiovascular: Negative.        Objective:   Physical Exam  Constitutional: She appears well-developed and well-nourished. No distress.  HENT:  Right Ear: External ear normal.  Left Ear: External ear normal.  Nose: Nose normal.  Mouth/Throat: Oropharynx is clear and moist.  Eyes: Conjunctivae are normal.  Neck: No thyromegaly present.  Cardiovascular: Normal rate, regular rhythm, normal heart sounds and intact distal pulses.  Pulmonary/Chest:  Loud rhonchi and wheezes throughout, decreased breath sounds in the right posterior base   Musculoskeletal:  Trace edema in both lower legs   Lymphadenopathy:    She has no cervical adenopathy.          Assessment & Plan:  Probable RLL pneumonia. Given a shot of Rocephin. Start on Levaquin for 10 days. She has an inhaler at home and I encouraged her to use this every 4 hours. Get a CXR today.  Gershon CraneStephen Fry, MD

## 2017-10-08 LAB — HM MAMMOGRAPHY

## 2017-10-24 ENCOUNTER — Encounter: Payer: Self-pay | Admitting: Family Medicine

## 2017-10-26 ENCOUNTER — Encounter: Payer: Self-pay | Admitting: Family Medicine

## 2017-10-26 ENCOUNTER — Ambulatory Visit: Payer: 59 | Admitting: Family Medicine

## 2017-10-26 VITALS — BP 138/78 | HR 83 | Temp 99.2°F | Ht 64.5 in | Wt 261.6 lb

## 2017-10-26 DIAGNOSIS — Z79899 Other long term (current) drug therapy: Secondary | ICD-10-CM | POA: Diagnosis not present

## 2017-10-26 DIAGNOSIS — J4520 Mild intermittent asthma, uncomplicated: Secondary | ICD-10-CM | POA: Diagnosis not present

## 2017-10-26 DIAGNOSIS — J0110 Acute frontal sinusitis, unspecified: Secondary | ICD-10-CM

## 2017-10-26 MED ORDER — DOXYCYCLINE HYCLATE 100 MG PO TABS: 100.0000 mg | ORAL_TABLET | Freq: Two times a day (BID) | ORAL | 0 refills | Status: AC

## 2017-10-26 NOTE — Progress Notes (Signed)
PCP: Nelwyn Salisbury, MD  Subjective:  Lori Durham is a 67 y.o. year old very pleasant female patient who presents with sinusitis symptoms including nasal congestion, sinus tenderness, now feels like congestion has gone down into her chest.  -other symptoms include: low grade temperatures into 99s.  -day of illness:6 -Symptoms show no change -previous treatments: hydrocodone at nightto help her sleep at night only due to heavy cough -sick contacts/travel/risks: denies flu exposure.  -Hx of: allergies- has tried allergy medicine at night  History of bronchitis and pneumonia- worried about symptoms quickly worsening.  Asthma as a child- no recent issues. Denies wheezing.   ROS-denies fever, SOB, NVD, tooth pain  Pertinent Past Medical History-  Patient Active Problem List   Diagnosis Date Noted  . 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 Post-operative state 01/31/2011  . Vaginitis 01/31/2011  . Varicose veins of both lower extremities with pain   . Vitamin D deficiency   . Obesity   . Colon polyps   . MORBID OBESITY 12/05/2009  . HYPERLIPIDEMIA 10/10/2009  . ASTHMA 10/10/2009  . LOW BACK PAIN 10/10/2009  . LEG PAIN 10/10/2009  . OSTEOPOROSIS 10/10/2009  . 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 HYPERTENSION 07/13/2008  . GERD 07/13/2008  . WEIGHT GAIN 07/13/2008    Medications- reviewed  Current Outpatient Medications  Medication Sig Dispense Refill  . acyclovir (ZOVIRAX) 400 MG tablet Take 1 tablet (400 mg total) by mouth as needed. 60 tablet 3  . ALPRAZolam (XANAX) 0.25 MG tablet Take 2 tablets (0.5 mg total) by mouth at bedtime. for anxiety 90 tablet 5  . BIOTIN PO Take 1 tablet by mouth daily.    . Calcium-Magnesium-Vitamin D 600-40-500 MG-MG-UNIT TB24 Take by mouth daily.    . Cholecalciferol (VITAMIN D3) 1000 UNITS CAPS Take 1 capsule by mouth daily.    . clarithromycin (BIAXIN) 500 MG tablet Take 1 tablet (500 mg total) by mouth 2 (two) times daily. 20 tablet 0  . Coenzyme  Q10 (COQ10) 100 MG CAPS Take by mouth once.    . diclofenac sodium (VOLTAREN) 1 % GEL Apply 4 g topically 3 (three) times daily as needed. 300 g 5  . diphenhydrAMINE (BENADRYL) 25 MG tablet Take 25 mg by mouth at bedtime.    Marland Kitchen doxycycline (VIBRA-TABS) 100 MG tablet Take 1 tablet (100 mg total) by mouth 2 (two) times daily for 7 days. If sinus congestion/pressure worsens or if symptoms last over 10 days 14 tablet 0  . fish oil-omega-3 fatty acids 1000 MG capsule Take 2 g by mouth daily.    . Glucosamine-Chondroit-Vit C-Mn (GLUCOSAMINE 1500 COMPLEX PO) Take 2 tablets by mouth daily.    Marland Kitchen HYDROcodone-homatropine (HYDROMET) 5-1.5 MG/5ML syrup Take 5 mLs by mouth every 4 (four) hours as needed. 240 mL 0  . liothyronine (CYTOMEL) 5 MCG tablet Take 1 tablet (5 mcg total) by mouth daily. 90 tablet 3  . meclizine (ANTIVERT) 25 MG tablet Take 1 tablet (25 mg total) by mouth every 4 (four) hours as needed for dizziness. 100 tablet 3  . meloxicam (MOBIC) 15 MG tablet Take 1 tablet (15 mg total) by mouth daily. 90 tablet 3  . methylPREDNISolone (MEDROL DOSEPAK) 4 MG TBPK tablet Take as directed 21 tablet 0  . Multiple Vitamin (MULTIVITAMIN) tablet Take 1 tablet by mouth daily.    Marland Kitchen omeprazole (PRILOSEC) 20 MG capsule Take 1 capsule (20 mg total) by mouth daily. 30 capsule 3  . Probiotic Product (ALIGN) 4 MG CAPS Take 1  capsule by mouth daily.    . raloxifene (EVISTA) 60 MG tablet Take 1 tablet (60 mg total) by mouth daily. 90 tablet 3  . SYNTHROID 125 MCG tablet Take 1 tablet (125 mcg total) by mouth daily. 90 tablet 3  . vitamin B-12 (CYANOCOBALAMIN) 1000 MCG tablet Take 1,000 mcg by mouth daily.    . Zinc 50 MG CAPS Take 1 capsule by mouth daily.     No current facility-administered medications for this visit.     Objective: BP 138/78 (BP Location: Left Arm, Patient Position: Sitting, Cuff Size: Normal)   Pulse 83   Temp 99.2 F (37.3 C) (Oral)   Ht 5' 4.5" (1.638 m)   Wt 261 lb 9.6 oz (118.7 kg)    SpO2 95%   BMI 44.21 kg/m  Gen: NAD, resting comfortably HEENT: Turbinates erythematous with minimal drainage, TM normal, pharynx mildly erythematous with no tonsilar exudate or edema, frontal and maxillary sinus tenderness CV: RRR no murmurs rubs or gallops Lungs: CTAB no crackles, wheeze, rhonchi Abdomen: soft/nontender/nondistended/normal bowel sounds. obese Skin: warm, dry, no rash  Assessment/Plan:  Sinsusitis Viral based on <10 days, no double sickening, lack of severity of symptoms in first 3 days. Educated on signs that bacterial infection may have developed (symptoms over 10 days, double sickening).   She has a history of bronchitis but there were no lung findings to suggest that today (though we discussed can always develop at a later point)  No signs of asthma flare  Treatment: -considered steroid: we opted out due to cost of injection previously and jitteriness on prednisone -other symptomatic care with hydrocodone for cough at night. Could also try delsym over the counter as may be less sedating/cause less dizziness -Antibiotic indicated: no, but since it is day 6 and her symptoms are not improving at this point- I wrote her a prescription for doxycycline to use if symptoms do not improve by 10 days or if they worsen.  - last night patient combined hydrocodone and xanax. I told her that she should not combine these medicines within 6 hours of each other. We discussed this is a high risk medication combination and should be avoided at all costs.   Finally, we reviewed reasons to return to care including if symptoms worsen or persist or new concerns arise (particularly fever or shortness of breath)  She has hives with augmentin Meds ordered this encounter  Medications  . doxycycline (VIBRA-TABS) 100 MG tablet    Sig: Take 1 tablet (100 mg total) by mouth 2 (two) times daily for 7 days. If sinus congestion/pressure worsens or if symptoms last over 10 days    Dispense:  14  tablet    Refill:  0    Tana ConchStephen Akima Slaugh, MD

## 2017-10-26 NOTE — Patient Instructions (Addendum)
    Sinsusitis Viral based on <10 days, no double sickening, lack of severity of symptoms in first 3 days. Educated on signs that bacterial infection may have developed (symptoms over 10 days, double sickening).   She has a history of bronchitis but there were no lung findings to suggest that today (though we discussed can always develop at a later point)  Treatment: -considered steroid: we opted out due to cost of injection previously and jitteriness on prednisone -other symptomatic care with hydrocodone for cough at night. Could also try delsym over the counter as may be less sedating/cause less dizziness -Antibiotic indicated: no, but since it is day 6 and her symptoms are not improving at this point- I wrote her a prescription for doxycycline to use if symptoms do not improve by 10 days or if they worsen.   Finally, we reviewed reasons to return to care including if symptoms worsen or persist or new concerns arise (particularly fever or shortness of breath)  Meds ordered this encounter  Medications  . doxycycline (VIBRA-TABS) 100 MG tablet    Sig: Take 1 tablet (100 mg total) by mouth 2 (two) times daily for 7 days. If sinus congestion/pressure worsens or if symptoms last over 10 days    Dispense:  14 tablet    Refill:  0

## 2017-10-26 NOTE — Telephone Encounter (Signed)
Pt has been scheduled to see Dr Durene CalHunter today, per her request. Nothing further needed at this time.

## 2017-10-27 NOTE — Telephone Encounter (Signed)
I see she was seen at another clinic

## 2017-11-10 ENCOUNTER — Encounter: Payer: Self-pay | Admitting: Family Medicine

## 2017-11-10 MED ORDER — ALPRAZOLAM 0.5 MG PO TABS: 0.5000 mg | ORAL_TABLET | Freq: Every evening | ORAL | 5 refills | Status: DC | PRN

## 2017-11-10 NOTE — Telephone Encounter (Signed)
Call in Xanax 0.5 mg to take every 8 hours prn anxiety, #90 with 5 rf

## 2017-11-10 NOTE — Telephone Encounter (Signed)
Rx has been called in  

## 2017-11-10 NOTE — Telephone Encounter (Signed)
Last fill 06/10/17 Last OV 08/016/19  Ok to fill?

## 2017-11-30 ENCOUNTER — Encounter: Payer: Self-pay | Admitting: Family Medicine

## 2017-11-30 NOTE — Telephone Encounter (Signed)
Call in Ketoconazole 2% cream to apply bid, 15 grams with no rf

## 2017-12-01 ENCOUNTER — Other Ambulatory Visit: Payer: Self-pay

## 2017-12-01 ENCOUNTER — Encounter: Payer: Self-pay | Admitting: Family Medicine

## 2017-12-01 MED ORDER — KETOCONAZOLE 2 % EX CREA: 1.0000 "application " | TOPICAL_CREAM | Freq: Two times a day (BID) | CUTANEOUS | 0 refills | Status: DC

## 2017-12-01 NOTE — Telephone Encounter (Signed)
Ketoconazole 2% cream to apply bid, 15 grams with no rf   Rx has been sent

## 2017-12-13 ENCOUNTER — Ambulatory Visit: Payer: Self-pay

## 2017-12-13 NOTE — Telephone Encounter (Signed)
Discussed w/ Dr. Clent RidgesFry who recommended OTC Benadryl 25 mg q4h and OV tomorrow. Returned call to patient. She states she has been using Benadryl w/o relief (this was not noted in initial triage note) and also using topical Gold Bond which provides temporary relief.  She "has to" work tomorrow and therefore declines to schedule appointment, but will continue the OTC Benadryl overnight.   She also notes that the angular chelitis that she was rx'd ketoconazole for 2 weeks ago has not improved. Recommended OV for further evaluation since symptoms present x5 weeks and not responsive to initial treatment. Appointment scheduled for Friday 12/17/17 w/ PCP.  Dr. Vevelyn FrancoisFry-FYI.

## 2017-12-13 NOTE — Telephone Encounter (Signed)
Incoming call from patient stating that she received her second shingles vaccination on Friday. States that neck is red  Hot burning, itching. Did not have reaction like this with first  injection.    Has applied ice pack around neck.  Attempted to offer appointment  Dr. Clent RidgesFry schedule is almost booked. Inquired if patient is will to see another provider.  Prefers to see Dr. Clent RidgesFry.  Informed patient that I would route call encounter to office .  Patient wishes a return call from office if she should come in for evaluation.  Denies respiratory  Distress.  Wants to know if something could be done in the office or if she should wait it out.  Patient awaits call from office.      Reason for Disposition . [1] Over 3 days (72 hours) since shot AND [2] redness, swelling or pain getting worse  Answer Assessment - Initial Assessment Questions 1. SYMPTOMS: "What is the main symptom?" (e.g., redness, swelling, pain)      Neck red hot. Itching like fire.  2. ONSET: "When was the vaccine (shot) given?" "How much later did the achy on Sat. Really start on yesterday. __ begin?" (e.g., hours, days ago)      3. SEVERITY: "How bad is it?"      Sever. 4. FEVER: "Is there a fever?" If so, ask: "What is it, how was it measured, and when did it start?"      dont know , neck very warm 5. IMMUNIZATIONS GIVEN: "What shots have you recently received?"    Shingles, flu shot also on friday 6. PAST REACTIONS: "Have you reacted to immunizations before?" If so, ask: "What happened?"      Flu shot on Friday also 7. OTHER SYMPTOMS: "Do you have any other symptoms?"     no  Protocols used: IMMUNIZATION REACTIONS-A-AH

## 2017-12-14 ENCOUNTER — Ambulatory Visit: Payer: 59 | Admitting: Family Medicine

## 2017-12-14 ENCOUNTER — Encounter: Payer: Self-pay | Admitting: Family Medicine

## 2017-12-14 VITALS — BP 160/100 | Temp 99.0°F | Wt 255.0 lb

## 2017-12-14 DIAGNOSIS — T50905A Adverse effect of unspecified drugs, medicaments and biological substances, initial encounter: Secondary | ICD-10-CM

## 2017-12-14 DIAGNOSIS — R5383 Other fatigue: Secondary | ICD-10-CM

## 2017-12-14 DIAGNOSIS — R509 Fever, unspecified: Secondary | ICD-10-CM

## 2017-12-14 DIAGNOSIS — L290 Pruritus ani: Secondary | ICD-10-CM | POA: Diagnosis not present

## 2017-12-14 MED ORDER — METHYLPREDNISOLONE 4 MG PO TBPK: ORAL_TABLET | ORAL | 0 refills | Status: DC

## 2017-12-14 NOTE — Progress Notes (Signed)
   Subjective:    Patient ID: Lori Durham, female    DOB: 1950/12/17, 67 y.o.   MRN: [id number]  HPI Here to check on an apparent allergic reaction to shots. She has gotten a flu shot for years and has never had a reaction. She had the first Shingrix shot in June and did well. However [facility] Shingrix shot and a flu shot on the same day. The next day she began to feel fatigued, she felt achy, she had a low grade fever, and she developed an itchy rash on the anterior neck. No tongue swelling or SOB. She has been taking Benadryl every 4 hours. Last night she began to feel better and today she feels a little better yet. The rash has calmed down a bit but is still very itchy. She plans to go back to work today.    Review of Systems  Constitutional: Positive for fatigue and fever.  Respiratory: Negative.   Cardiovascular: Negative.   Musculoskeletal: Positive for myalgias.  Skin: Positive for rash.  Neurological: Negative.        Objective:   Physical Exam  Constitutional: She is oriented to person, place, and time. She appears well-developed and well-nourished. No distress.  Cardiovascular: Normal rate, regular rhythm, normal heart sounds and intact distal pulses.  Pulmonary/Chest: Effort normal and breath sounds normal. No stridor. No respiratory distress. She has no wheezes. She has no rales.  Neurological: She is alert and oriented to person, place, and time.  Skin:  The anterior neck has a macular red rash          Assessment & Plan:  She is recovering from an allergic reaction to getting both the flu vaccine and the Shingrix vaccine together. She will use Benadryl prn. Add a Medrol dose pack. Recheck prn.  Gershon CraneStephen Chesley Veasey, MD

## 2017-12-17 ENCOUNTER — Ambulatory Visit: Payer: 59 | Admitting: Family Medicine

## 2017-12-20 ENCOUNTER — Encounter: Payer: Self-pay | Admitting: Family Medicine

## 2017-12-21 ENCOUNTER — Encounter: Payer: Self-pay | Admitting: Family Medicine

## 2017-12-21 NOTE — Telephone Encounter (Signed)
Patient called in for a follow up to progress of message.

## 2017-12-22 NOTE — Telephone Encounter (Signed)
I agree. Lets hold off on BP meds for now and watch it

## 2017-12-22 NOTE — Telephone Encounter (Signed)
Dr. Fry please advise. Thanks  

## 2017-12-22 NOTE — Telephone Encounter (Signed)
See her call from 12-22-17

## 2018-01-20 DIAGNOSIS — M79644 Pain in right finger(s): Secondary | ICD-10-CM | POA: Diagnosis not present

## 2018-01-26 ENCOUNTER — Inpatient Hospital Stay (HOSPITAL_COMMUNITY)
Admission: EM | Admit: 2018-01-26 | Discharge: 2018-01-28 | DRG: 149 | Disposition: A | Discharge status: Home Health | Payer: PPO | Attending: Internal Medicine | Admitting: Internal Medicine

## 2018-01-26 ENCOUNTER — Other Ambulatory Visit: Payer: Self-pay

## 2018-01-26 ENCOUNTER — Ambulatory Visit: Payer: Self-pay | Admitting: *Deleted

## 2018-01-26 ENCOUNTER — Encounter (HOSPITAL_COMMUNITY): Payer: Self-pay

## 2018-01-26 ENCOUNTER — Emergency Department (HOSPITAL_COMMUNITY): Payer: PPO

## 2018-01-26 DIAGNOSIS — Z8262 Family history of osteoporosis: Secondary | ICD-10-CM | POA: Diagnosis not present

## 2018-01-26 DIAGNOSIS — K219 Gastro-esophageal reflux disease without esophagitis: Secondary | ICD-10-CM | POA: Diagnosis not present

## 2018-01-26 DIAGNOSIS — J4520 Mild intermittent asthma, uncomplicated: Secondary | ICD-10-CM | POA: Diagnosis present

## 2018-01-26 DIAGNOSIS — E119 Type 2 diabetes mellitus without complications: Secondary | ICD-10-CM | POA: Diagnosis not present

## 2018-01-26 DIAGNOSIS — Z9884 Bariatric surgery status: Secondary | ICD-10-CM

## 2018-01-26 DIAGNOSIS — E890 Postprocedural hypothyroidism: Secondary | ICD-10-CM | POA: Diagnosis not present

## 2018-01-26 DIAGNOSIS — Z8249 Family history of ischemic heart disease and other diseases of the circulatory system: Secondary | ICD-10-CM

## 2018-01-26 DIAGNOSIS — H8110 Benign paroxysmal vertigo, unspecified ear: Principal | ICD-10-CM | POA: Diagnosis present

## 2018-01-26 DIAGNOSIS — Z6841 Body Mass Index (BMI) 40.0 and over, adult: Secondary | ICD-10-CM

## 2018-01-26 DIAGNOSIS — G4733 Obstructive sleep apnea (adult) (pediatric): Secondary | ICD-10-CM | POA: Diagnosis present

## 2018-01-26 DIAGNOSIS — E785 Hyperlipidemia, unspecified: Secondary | ICD-10-CM | POA: Diagnosis present

## 2018-01-26 DIAGNOSIS — E876 Hypokalemia: Secondary | ICD-10-CM

## 2018-01-26 DIAGNOSIS — Z79890 Hormone replacement therapy (postmenopausal): Secondary | ICD-10-CM

## 2018-01-26 DIAGNOSIS — M810 Age-related osteoporosis without current pathological fracture: Secondary | ICD-10-CM | POA: Diagnosis present

## 2018-01-26 DIAGNOSIS — I10 Essential (primary) hypertension: Secondary | ICD-10-CM | POA: Diagnosis present

## 2018-01-26 DIAGNOSIS — R42 Dizziness and giddiness: Secondary | ICD-10-CM

## 2018-01-26 DIAGNOSIS — Z9049 Acquired absence of other specified parts of digestive tract: Secondary | ICD-10-CM

## 2018-01-26 DIAGNOSIS — Z79899 Other long term (current) drug therapy: Secondary | ICD-10-CM

## 2018-01-26 DIAGNOSIS — G4489 Other headache syndrome: Secondary | ICD-10-CM | POA: Diagnosis not present

## 2018-01-26 DIAGNOSIS — Z90710 Acquired absence of both cervix and uterus: Secondary | ICD-10-CM | POA: Diagnosis not present

## 2018-01-26 DIAGNOSIS — R112 Nausea with vomiting, unspecified: Secondary | ICD-10-CM | POA: Diagnosis not present

## 2018-01-26 LAB — COMPREHENSIVE METABOLIC PANEL
ALBUMIN: 4.1 g/dL (ref 3.5–5.0)
ALK PHOS: 68 U/L (ref 38–126)
ALT: 24 U/L (ref 0–44)
AST: 26 U/L (ref 15–41)
Anion gap: 12 (ref 5–15)
BUN: 12 mg/dL (ref 8–23)
CALCIUM: 8.9 mg/dL (ref 8.9–10.3)
CHLORIDE: 104 mmol/L (ref 98–111)
CO2: 25 mmol/L (ref 22–32)
CREATININE: 0.55 mg/dL (ref 0.44–1.00)
GFR calc Af Amer: >60 mL/min (ref >60)
GFR calc non Af Amer: >60 mL/min (ref >60)
GLUCOSE: 120 mg/dL — AB (ref 70–99)
Potassium: 3 mmol/L — ABNORMAL LOW (ref 3.5–5.1)
Sodium: 141 mmol/L (ref 135–145)
Total Bilirubin: 1 mg/dL (ref 0.3–1.2)
Total Protein: 7 g/dL (ref 6.5–8.1)

## 2018-01-26 LAB — MAGNESIUM: Magnesium: 2.2 mg/dL (ref 1.7–2.4)

## 2018-01-26 LAB — URINALYSIS, ROUTINE W REFLEX MICROSCOPIC
BACTERIA UA: NONE SEEN
BILIRUBIN URINE: NEGATIVE
Glucose, UA: NEGATIVE mg/dL
HGB URINE DIPSTICK: NEGATIVE
Ketones, ur: 20 mg/dL — AB
LEUKOCYTES UA: NEGATIVE
NITRITE: NEGATIVE
PROTEIN: NEGATIVE mg/dL
Specific Gravity, Urine: 1.013 (ref 1.005–1.030)
pH: 9 — ABNORMAL HIGH (ref 5.0–8.0)

## 2018-01-26 LAB — CBG MONITORING, ED: GLUCOSE-CAPILLARY: 145 mg/dL — AB (ref 70–99)

## 2018-01-26 LAB — CBC WITH DIFFERENTIAL/PLATELET
ABS IMMATURE GRANULOCYTES: 0.02 10*3/uL (ref 0.00–0.07)
BASOS ABS: 0 10*3/uL (ref 0.0–0.1)
Basophils Relative: 0 %
Eosinophils Absolute: 0.2 10*3/uL (ref 0.0–0.5)
Eosinophils Relative: 2 %
HEMATOCRIT: 42.3 % (ref 36.0–46.0)
HEMOGLOBIN: 14.3 g/dL (ref 12.0–15.0)
IMMATURE GRANULOCYTES: 0 %
LYMPHS PCT: 17 %
Lymphs Abs: 1.3 10*3/uL (ref 0.7–4.0)
MCH: 34.8 pg — ABNORMAL HIGH (ref 26.0–34.0)
MCHC: 33.8 g/dL (ref 30.0–36.0)
MCV: 102.9 fL — ABNORMAL HIGH (ref 80.0–100.0)
MONOS PCT: 6 %
Monocytes Absolute: 0.5 10*3/uL (ref 0.1–1.0)
NEUTROS PCT: 75 %
NRBC: 0 % (ref 0.0–0.2)
Neutro Abs: 5.4 10*3/uL (ref 1.7–7.7)
Platelets: 228 10*3/uL (ref 150–400)
RBC: 4.11 MIL/uL (ref 3.87–5.11)
RDW: 11.9 % (ref 11.5–15.5)
WBC: 7.4 10*3/uL (ref 4.0–10.5)

## 2018-01-26 LAB — TROPONIN I: Troponin I: <0.03 ng/mL (ref <0.03)

## 2018-01-26 LAB — RAPID URINE DRUG SCREEN, HOSP PERFORMED
Amphetamines: NOT DETECTED
Barbiturates: NOT DETECTED
Benzodiazepines: NOT DETECTED
COCAINE: NOT DETECTED
OPIATES: NOT DETECTED
Tetrahydrocannabinol: NOT DETECTED

## 2018-01-26 LAB — ETHANOL: Alcohol, Ethyl (B): <10 mg/dL (ref <10)

## 2018-01-26 MED ORDER — METOCLOPRAMIDE HCL 5 MG/ML IJ SOLN
10.0000 mg | Freq: Once | INTRAMUSCULAR | Status: AC
  Administered 2018-01-26: 10 mg via INTRAVENOUS
  Filled 2018-01-26: qty 2

## 2018-01-26 MED ORDER — MECLIZINE HCL 25 MG PO TABS
25.0000 mg | ORAL_TABLET | Freq: Once | ORAL | Status: AC
  Administered 2018-01-26: 25 mg via ORAL
  Filled 2018-01-26: qty 1

## 2018-01-26 MED ORDER — SODIUM CHLORIDE 0.9 % IV BOLUS
1000.0000 mL | Freq: Once | INTRAVENOUS | Status: AC
  Administered 2018-01-26: 1000 mL via INTRAVENOUS

## 2018-01-26 MED ORDER — DIAZEPAM 5 MG PO TABS
5.0000 mg | ORAL_TABLET | Freq: Once | ORAL | Status: AC
  Administered 2018-01-26: 5 mg via ORAL
  Filled 2018-01-26: qty 1

## 2018-01-26 MED ORDER — PANTOPRAZOLE SODIUM 40 MG PO TBEC
40.0000 mg | DELAYED_RELEASE_TABLET | Freq: Every day | ORAL | Status: DC
  Administered 2018-01-27 – 2018-01-28 (×2): 40 mg via ORAL
  Filled 2018-01-26 (×2): qty 1

## 2018-01-26 MED ORDER — LEVOTHYROXINE SODIUM 125 MCG PO TABS
125.0000 ug | ORAL_TABLET | Freq: Every day | ORAL | Status: DC
  Administered 2018-01-27: 125 ug via ORAL
  Filled 2018-01-26 (×2): qty 1

## 2018-01-26 MED ORDER — AMLODIPINE BESYLATE 5 MG PO TABS
10.0000 mg | ORAL_TABLET | Freq: Every day | ORAL | Status: DC
  Administered 2018-01-26 – 2018-01-28 (×3): 10 mg via ORAL
  Filled 2018-01-26 (×3): qty 2

## 2018-01-26 MED ORDER — ENOXAPARIN SODIUM 60 MG/0.6ML ~~LOC~~ SOLN
60.0000 mg | SUBCUTANEOUS | Status: DC
  Administered 2018-01-26 – 2018-01-27 (×2): 60 mg via SUBCUTANEOUS
  Filled 2018-01-26 (×2): qty 0.6

## 2018-01-26 MED ORDER — POTASSIUM CHLORIDE CRYS ER 20 MEQ PO TBCR
40.0000 meq | EXTENDED_RELEASE_TABLET | ORAL | Status: AC
  Administered 2018-01-26: 40 meq via ORAL
  Filled 2018-01-26 (×2): qty 2

## 2018-01-26 MED ORDER — RALOXIFENE HCL 60 MG PO TABS
60.0000 mg | ORAL_TABLET | Freq: Every day | ORAL | Status: DC
  Administered 2018-01-27 – 2018-01-28 (×2): 60 mg via ORAL
  Filled 2018-01-26 (×2): qty 1

## 2018-01-26 MED ORDER — LABETALOL HCL 5 MG/ML IV SOLN
10.0000 mg | INTRAVENOUS | Status: DC | PRN
  Filled 2018-01-26: qty 4

## 2018-01-26 MED ORDER — MECLIZINE HCL 25 MG PO TABS
25.0000 mg | ORAL_TABLET | Freq: Three times a day (TID) | ORAL | Status: DC
  Administered 2018-01-26 – 2018-01-28 (×6): 25 mg via ORAL
  Filled 2018-01-26 (×7): qty 1

## 2018-01-26 MED ORDER — LIOTHYRONINE SODIUM 5 MCG PO TABS
5.0000 ug | ORAL_TABLET | Freq: Every day | ORAL | Status: DC
  Administered 2018-01-27 – 2018-01-28 (×2): 5 ug via ORAL
  Filled 2018-01-26 (×2): qty 1

## 2018-01-26 MED ORDER — ONDANSETRON HCL 4 MG/2ML IJ SOLN
4.0000 mg | Freq: Four times a day (QID) | INTRAMUSCULAR | Status: DC | PRN
  Administered 2018-01-26 – 2018-01-27 (×2): 4 mg via INTRAVENOUS
  Filled 2018-01-26 (×3): qty 2

## 2018-01-26 MED ORDER — METHYLPREDNISOLONE SODIUM SUCC 125 MG IJ SOLR
125.0000 mg | Freq: Once | INTRAMUSCULAR | Status: AC
  Administered 2018-01-26: 125 mg via INTRAVENOUS
  Filled 2018-01-26: qty 2

## 2018-01-26 NOTE — ED Triage Notes (Signed)
EMS reports from home, nausea and vomiting with weakness and dizziness since 7pm yesterday after eating leftover taco dinner. Pt has gastric sleeve since 2011.  Given 8mg  Zofran IV enroute  20 LAC

## 2018-01-26 NOTE — H&P (Signed)
History and Physical    Lori Durham ZOX:[id number] DOB: [birth year] DOA: 01/26/2018  PCP: Nelwyn Salisbury, MD   Patient coming from: Home    Chief Complaint: Dizziness, vertigo  HPI: Lori Durham is a 67 y.o. female with medical history significant of morbid obesity, GERD, hypothyroidism, osteoporosis who presents from home with complaints of dizziness along with nausea and vomiting that started about 7 PM last night.  Patient was apparently all right before that.  She was about to go to her bed when she suddenly developed severe vertigo.  She also developed severe nausea and vomited several times.  This happens to her once or twice a year and it improves with meclizine.  But this time it was so severe that she had to present to the emergency department.  Patient felt that the room is spinning around her.  She gets severe vertigo when she raises her head from the lying position. Patient seen and examined the bedside.  She was found to be hypertensive.  She does not complain of any dizziness when she lies down.  Her nausea and vomiting has improved.  She denies any chest pain, shortness of breath, palpitations, abdominal pain, dysuria, fever, chills, headache, blurry vision.  Denies any hearing problems.  ED Course: MRI was done which did not show any acute intracranial abnormalities.  Patient was also given Solu-Medrol, Valium and meclizine.  Review of Systems: As per HPI otherwise 10 point review of systems negative.    Past Medical History:  Diagnosis Date  . Asthma, mild intermittent   . GERD (gastroesophageal reflux disease)    watches diet  . History of colon polyps   . History of diabetes mellitus    no issue since gastric sleeve surgery and lost wt.  . History of exercise stress test    ETT--  06-17-2007--  NORMAL  . History of hypertension    no issue since gastric sleeve surgery and lost wt.  . Hyperlipidemia   . Hypothyroidism, postsurgical   . OSA (obstructive sleep  apnea)    MODERATE OSA PER STUDY 09-25-2009--- no longer uses cpap,  no issue since gastric sleeve surgery and lost wt.  . Osteoporosis    last DEXA on 11-20-10 was normal   . PONV (postoperative nausea and vomiting)   . Right knee meniscal tear   . Varicose veins of both lower extremities with pain   . Vitamin D deficiency   . Wears glasses     Past Surgical History:  Procedure Laterality Date  . COLONOSCOPY  12-07-11   per Dr. Rodena Piety at [facility], clear (hx of adenomatous polyps), repeat in 5 yrs   . KNEE ARTHROSCOPY Right 08/10/2014   Procedure: ARTHROSCOPY RIGHT KNEE WITH DEBRIDEMENT;  Surgeon: Durene Romans, MD;  Location: [facility];  Service: Orthopedics;  Laterality: Right;  . LAPAROSCOPIC CHOLECYSTECTOMY  Nov 2012  [place]  . LAPAROSCOPIC GASTRIC SLEEVE RESECTION  11-15-2009   [place]  . THYROID LOBECTOMY Left 1991   benign  . TONSILLECTOMY AND ADENOIDECTOMY  age 76  . VAGINAL HYSTERECTOMY  1995   w/ ovaries intact     reports that she has never smoked. She has never used smokeless tobacco. She reports that she drinks alcohol. She reports that she does not use drugs.  Allergies  Allergen Reactions  . Augmentin [Amoxicillin-Pot Clavulanate] Hives    Has patient had a PCN reaction causing immediate rash, facial/tongue/throat swelling, SOB or lightheadedness with hypotension: Yes  Has patient had a PCN reaction causing severe rash involving mucus membranes or skin necrosis: No Has patient had a PCN reaction that required hospitalization: No Has patient had a PCN reaction occurring within the last 10 years: Yes If all of the above answers are "NO", then may proceed with Cephalosporin use.   . Contrast Media [Iodinated Diagnostic Agents] Hives    Caused skin to burn  . Hydrocodone Other (See Comments)    ( cough syrup ) light headed    Family History  Problem Relation Age of Onset  . Hypothyroidism Mother   . Heart disease Mother   . Hypertension  Mother   . Alcohol abuse Father   . Heart disease Maternal Grandmother   . Alcohol abuse Paternal Grandfather   . Alcohol abuse Brother   . Heart disease Brother   . Heart attack Brother 42  . Alcohol abuse Brother   . Osteoporosis Brother   . Other Brother        varicose veins  . Osteoporosis Brother   . Arthritis Other        family hx of  . Coronary artery disease Other        family history of  . Hyperlipidemia Other        family history of  . Hypertension Other        family history of     Prior to Admission medications   Medication Sig Start Date End Date Taking? Authorizing Provider  ALPRAZolam Prudy Feeler) 0.25 MG tablet Take 2 tablets (0.5 mg total) by mouth at bedtime. for anxiety Patient taking differently: Take 0.25 mg by mouth at bedtime. for anxiety 06/10/17  Yes Nelwyn Salisbury, MD  BIOTIN PO Take 1 tablet by mouth daily.   Yes [provider]  Calcium-Magnesium-Vitamin D 600-40-500 MG-MG-UNIT TB24 Take by mouth daily.   Yes [provider]  Cholecalciferol (VITAMIN D3) 1000 UNITS CAPS Take 1 capsule by mouth daily.   Yes [provider]  Coenzyme Q10 (COQ10) 100 MG CAPS Take by mouth once.   Yes [provider]  diphenhydrAMINE (BENADRYL) 25 MG tablet Take 25 mg by mouth at bedtime.   Yes [provider]  fish oil-omega-3 fatty acids 1000 MG capsule Take 2 g by mouth daily.   Yes [provider]  Glucosamine-Chondroit-Vit C-Mn (GLUCOSAMINE 1500 COMPLEX PO) Take 2 tablets by mouth daily.   Yes [provider]  ketoconazole (NIZORAL) 2 % cream Apply 1 application topically 2 (two) times daily. 12/01/17  Yes Nelwyn Salisbury, MD  liothyronine (CYTOMEL) 5 MCG tablet Take 1 tablet (5 mcg total) by mouth daily. 06/10/17  Yes Nelwyn Salisbury, MD  meclizine (ANTIVERT) 25 MG tablet Take 1 tablet (25 mg total) by mouth every 4 (four) hours as needed for dizziness. 03/08/14  Yes Nelwyn Salisbury, MD  Multiple Vitamin  (MULTIVITAMIN) tablet Take 1 tablet by mouth daily.   Yes [provider]  omeprazole (PRILOSEC) 20 MG capsule Take 1 capsule (20 mg total) by mouth daily. 06/10/17  Yes Nelwyn Salisbury, MD  Probiotic Product (ALIGN) 4 MG CAPS Take 1 capsule by mouth daily.   Yes [provider]  raloxifene (EVISTA) 60 MG tablet Take 1 tablet (60 mg total) by mouth daily. 06/10/17  Yes Nelwyn Salisbury, MD  SYNTHROID 125 MCG tablet Take 1 tablet (125 mcg total) by mouth daily. 06/10/17  Yes Nelwyn Salisbury, MD  vitamin B-12 (CYANOCOBALAMIN) 1000 MCG tablet Take  1,000 mcg by mouth daily.   Yes [provider]  Zinc 50 MG CAPS Take 1 capsule by mouth daily.   Yes [provider]    Physical Exam: Vitals:   01/26/18 1450 01/26/18 1543 01/26/18 1544 01/26/18 1549  BP: (!) 173/74   (!) 169/100  Pulse: 81   89  Resp: 16   16  Temp:      TempSrc:      SpO2: 94% 92% 94% 97%  Weight:      Height:        Constitutional: Not In distress, obese Vitals:   01/26/18 1450 01/26/18 1543 01/26/18 1544 01/26/18 1549  BP: (!) 173/74   (!) 169/100  Pulse: 81   89  Resp: 16   16  Temp:      TempSrc:      SpO2: 94% 92% 94% 97%  Weight:      Height:       Eyes: PERRL, lids and conjunctivae normal,No nystagmus ENMT: Mucous membranes are moist. Posterior pharynx clear of any exudate or lesions.Normal dentition.  Neck: normal, supple, no masses, no thyromegaly Respiratory: clear to auscultation bilaterally, no wheezing, no crackles. Normal respiratory effort. No accessory muscle use.  Cardiovascular: Regular rate and rhythm, no murmurs / rubs / gallops. Trace lower  extremity edema. 2+ pedal pulses. No carotid bruits.  Abdomen: no tenderness, no masses palpated. No hepatosplenomegaly. Bowel sounds positive.  Musculoskeletal: no clubbing / cyanosis. No joint deformity upper and lower extremities. Good ROM, no contractures. Normal muscle tone.  Skin: no rashes, lesions, ulcers. No  induration Neurologic: CN 2-12 grossly intact. Sensation intact, DTR normal. Strength 5/5 in all 4.  Psychiatric: Normal judgment and insight. Alert and oriented x 3. Normal mood.   Foley Catheter:None  Labs on Admission: I have personally reviewed following labs and imaging studies  CBC: Recent Labs  Lab 01/26/18 1236  WBC 7.4  NEUTROABS 5.4  HGB 14.3  HCT 42.3  MCV 102.9*  PLT 228   Basic Metabolic Panel: Recent Labs  Lab 01/26/18 1236  NA 141  K 3.0*  CL 104  CO2 25  GLUCOSE 120*  BUN 12  CREATININE 0.55  CALCIUM 8.9   GFR: Estimated Creatinine Clearance: 88.1 mL/min (by C-G formula based on SCr of 0.55 mg/dL). Liver Function Tests: Recent Labs  Lab 01/26/18 1236  AST 26  ALT 24  ALKPHOS 68  BILITOT 1.0  PROT 7.0  ALBUMIN 4.1   No results for input(s): LIPASE, AMYLASE in the last 168 hours. No results for input(s): AMMONIA in the last 168 hours. Coagulation Profile: No results for input(s): INR, PROTIME in the last 168 hours. Cardiac Enzymes: Recent Labs  Lab 01/26/18 1237  TROPONINI <0.03   BNP (last 3 results) No results for input(s): PROBNP in the last 8760 hours. HbA1C: No results for input(s): HGBA1C in the last 72 hours. CBG: Recent Labs  Lab 01/26/18 1037  GLUCAP 145*   Lipid Profile: No results for input(s): CHOL, HDL, LDLCALC, TRIG, CHOLHDL, LDLDIRECT in the last 72 hours. Thyroid Function Tests: No results for input(s): TSH, T4TOTAL, FREET4, T3FREE, THYROIDAB in the last 72 hours. Anemia Panel: No results for input(s): VITAMINB12, FOLATE, FERRITIN, TIBC, IRON, RETICCTPCT in the last 72 hours. Urine analysis:    Component Value Date/Time   COLORURINE YELLOW 01/26/2018 1449   APPEARANCEUR CLOUDY (A) 01/26/2018 1449   LABSPEC 1.013 01/26/2018 1449   PHURINE 9.0 (H) 01/26/2018 1449   GLUCOSEU NEGATIVE 01/26/2018 1449  HGBUR NEGATIVE 01/26/2018 1449   BILIRUBINUR NEGATIVE 01/26/2018 1449   BILIRUBINUR n 06/10/2017 1131    KETONESUR 20 (A) 01/26/2018 1449   PROTEINUR NEGATIVE 01/26/2018 1449   UROBILINOGEN 0.2 06/10/2017 1131   NITRITE NEGATIVE 01/26/2018 1449   LEUKOCYTESUR NEGATIVE 01/26/2018 1449    Radiological Exams on Admission: Mr Brain Wo Contrast  Result Date: 01/26/2018 CLINICAL DATA:  Nausea, vomiting and dizziness. Foot numbness. History of hyperlipidemia and gastric bypass. EXAM: MRI HEAD WITHOUT CONTRAST TECHNIQUE: Multiplanar, multiecho pulse sequences of the brain and surrounding structures were obtained without intravenous contrast. COMPARISON:  CT neck October 09, 2016 FINDINGS: INTRACRANIAL CONTENTS: No reduced diffusion to suggest acute ischemia. No susceptibility artifact to suggest hemorrhage. The ventricles and sulci are normal for patient's age. Scattered subcentimeter supratentorial white matter FLAIR T2 hyperintensities compatible with mild chronic small vessel ischemic changes, normal for age. No suspicious parenchymal signal, masses, mass effect. No abnormal extra-axial fluid collections. No extra-axial masses. VASCULAR: Normal major intracranial vascular flow voids present at skull base. SKULL AND UPPER CERVICAL SPINE: No abnormal sellar expansion. No suspicious calvarial bone marrow signal. Craniocervical junction maintained. SINUSES/ORBITS: The mastoid air-cells and included paranasal sinuses are well-aerated.The included ocular globes and orbital contents are non-suspicious. OTHER: None. IMPRESSION: Normal MRI head without contrast for age. Electronically Signed   By: Awilda Metro M.D.   On: 01/26/2018 14:04     Assessment/Plan Principal Problem:   Vertigo Active Problems:   HYPOTHYROIDISM, POSTSURGICAL   Morbid obesity (HCC)   Essential hypertension   GERD   Osteoporosis   Hypokalemia  Vertigo: This is most likely benign paroxysmal positional vertigo.  Patient gets severe dizziness when she raises her head from the lying position.  Continue meclizine for now.  Since she was  unable to ambulate due to dizziness, we will also request for PT evaluation for tomorrow. If her dizziness improves tomorrow, she can be discharged on oral meclizine and she can follow-up with ENT as an outpatient. Continue zofran for nausea. MRI did not show any acute intracranial abnormalities.  No history of Mnire's disease.  Denies any hearing problems.  Hyporthyroidism: Continue Synthyroid.  Hypertension: Noted to be hypertensive in the emergency department.  Does not take any medication at home.  Started on amlodipine.  Continue PRN IV meds for hypertension.  GERD: Continue PPI  Osteoporosis: Continue raloxifene  Hypokalemia: Supplemented with potassium.  We will check the levels tomorrow.Will check Mg level too.  Morbid obesity: Counseled on the importance of diet and exercise.    Severity of Illness: The appropriate patient status for this patient is OBSERVATION.   DVT prophylaxis: Lovenox Code Status: Full Family Communication: None present at the bedside Consults called: None     Burnadette Pop MD [facility] Pager [phone]  If 7PM-7AM, please contact night-coverage [email or web address] Password [email or web address]  01/26/2018, 5:04 PM

## 2018-01-26 NOTE — ED Notes (Signed)
Pt has purewick in place for urine collection 

## 2018-01-26 NOTE — ED Notes (Signed)
ED TO INPATIENT HANDOFF REPORT  Name/Age/Gender Lori Durham 67 y.o. female  Code Status    Code Status Orders  (From admission, onward)         Start     Ordered   01/26/18 1701  Full code  Continuous     01/26/18 1701        Code Status History    This patient has a current code status but no historical code status.      Home/SNF/Other Given to floor  Chief Complaint weakness  Level of Care/Admitting Diagnosis ED Disposition    ED Disposition Condition [facility] Area: [place] [100102]  Level of Care: Telemetry [5]  Admit to tele based on following criteria: Other see comments  Comments: HTN  Diagnosis: Vertigo [814481]  Admitting Physician: Shelly Coss [[id number]]  Attending Physician: Shelly Coss [[id number]]  PT Class (Do Not Modify): Observation [104]  PT Acc Code (Do Not Modify): Observation [10022]       Medical History Past Medical History:  Diagnosis Date  . Asthma, mild intermittent   . GERD (gastroesophageal reflux disease)    watches diet  . History of colon polyps   . History of diabetes mellitus    no issue since gastric sleeve surgery and lost wt.  . History of exercise stress test    ETT--  06-17-2007--  NORMAL  . History of hypertension    no issue since gastric sleeve surgery and lost wt.  . Hyperlipidemia   . Hypothyroidism, postsurgical   . OSA (obstructive sleep apnea)    MODERATE OSA PER STUDY 09-25-2009--- no longer uses cpap,  no issue since gastric sleeve surgery and lost wt.  . Osteoporosis    last DEXA on 11-20-10 was normal   . PONV (postoperative nausea and vomiting)   . Right knee meniscal tear   . Varicose veins of both lower extremities with pain   . Vitamin D deficiency   . Wears glasses     Allergies Allergies  Allergen Reactions  . Augmentin [Amoxicillin-Pot Clavulanate] Hives    Has patient had a PCN reaction causing immediate rash, facial/tongue/throat  swelling, SOB or lightheadedness with hypotension: Yes Has patient had a PCN reaction causing severe rash involving mucus membranes or skin necrosis: No Has patient had a PCN reaction that required hospitalization: No Has patient had a PCN reaction occurring within the last 10 years: Yes If all of the above answers are "NO", then may proceed with Cephalosporin use.   . Contrast Media [Iodinated Diagnostic Agents] Hives    Caused skin to burn  . Hydrocodone Other (See Comments)    ( cough syrup ) light headed    IV Location/Drains/Wounds Patient Lines/Drains/Airways Status   Active Line/Drains/Airways    Name:   Placement date:   Placement time:   Site:   Days:   Peripheral IV 01/26/18 Left Antecubital   01/26/18    1029    Antecubital   less than 1   Incision (Closed) 08/10/14 Knee Right   08/10/14    5885     0277          Labs/Imaging Results for orders placed or performed during the hospital encounter of 01/26/18 (from the past 48 hour(s))  CBG monitoring, ED     Status: Abnormal   Collection Time: 01/26/18 10:37 AM  Result Value Ref Range   Glucose-Capillary 145 (H) 70 - 99 mg/dL  Comprehensive metabolic panel  Status: Abnormal   Collection Time: 01/26/18 12:36 PM  Result Value Ref Range   Sodium 141 135 - 145 mmol/L   Potassium 3.0 (L) 3.5 - 5.1 mmol/L   Chloride 104 98 - 111 mmol/L   CO2 25 22 - 32 mmol/L   Glucose, Bld 120 (H) 70 - 99 mg/dL   BUN 12 8 - 23 mg/dL   Creatinine, Ser 0.55 0.44 - 1.00 mg/dL   Calcium 8.9 8.9 - 10.3 mg/dL   Total Protein 7.0 6.5 - 8.1 g/dL   Albumin 4.1 3.5 - 5.0 g/dL   AST 26 15 - 41 U/L   ALT 24 0 - 44 U/L   Alkaline Phosphatase 68 38 - 126 U/L   Total Bilirubin 1.0 0.3 - 1.2 mg/dL   GFR calc non Af Amer >60 >60 mL/min   GFR calc Af Amer >60 >60 mL/min    Comment: (NOTE) The eGFR has been calculated using the CKD EPI equation. This calculation has not been validated in all clinical situations. eGFR's persistently <60 mL/min  signify possible Chronic Kidney Disease.    Anion gap 12 5 - 15    Comment: Performed at [facility], [place] [address] [place], [place] [address]  CBC with Differential     Status: Abnormal   Collection Time: 01/26/18 12:36 PM  Result Value Ref Range   WBC 7.4 4.0 - 10.5 K/uL    Comment: WHITE COUNT CONFIRMED ON SMEAR   RBC 4.11 3.87 - 5.11 MIL/uL   Hemoglobin 14.3 12.0 - 15.0 g/dL   HCT 42.3 36.0 - 46.0 %   MCV 102.9 (H) 80.0 - 100.0 fL   MCH 34.8 (H) 26.0 - 34.0 pg   MCHC 33.8 30.0 - 36.0 g/dL   RDW 11.9 11.5 - 15.5 %   Platelets 228 150 - 400 K/uL   nRBC 0.0 0.0 - 0.2 %   Neutrophils Relative % 75 %   Neutro Abs 5.4 1.7 - 7.7 K/uL   Lymphocytes Relative 17 %   Lymphs Abs 1.3 0.7 - 4.0 K/uL   Monocytes Relative 6 %   Monocytes Absolute 0.5 0.1 - 1.0 K/uL   Eosinophils Relative 2 %   Eosinophils Absolute 0.2 0.0 - 0.5 K/uL   Basophils Relative 0 %   Basophils Absolute 0.0 0.0 - 0.1 K/uL   Immature Granulocytes 0 %   Abs Immature Granulocytes 0.02 0.00 - 0.07 K/uL    Comment: Performed at [facility], [place] [address], [place], [place] [address]  Ethanol     Status: None   Collection Time: 01/26/18 12:37 PM  Result Value Ref Range   Alcohol, Ethyl (B) <10 <10 mg/dL    Comment: (NOTE) Lowest detectable limit for serum alcohol is 10 mg/dL. For medical purposes only. Performed at [facility], [place] [address] [place], [place] [address]   Troponin I     Status: None   Collection Time: 01/26/18 12:37 PM  Result Value Ref Range   Troponin I <0.03 <0.03 ng/mL    Comment: Performed at [facility], [place] [address] [place], [place] [address]  Urinalysis, Routine w reflex microscopic     Status: Abnormal   Collection Time: 01/26/18  2:49 PM  Result Value Ref Range   Color, Urine YELLOW YELLOW   APPearance CLOUDY (A) CLEAR   Specific Gravity, Urine 1.013 1.005 - 1.030   pH 9.0 (H) 5.0 - 8.0    Glucose, UA NEGATIVE  NEGATIVE mg/dL   Hgb urine dipstick NEGATIVE NEGATIVE   Bilirubin Urine NEGATIVE NEGATIVE   Ketones, ur 20 (A) NEGATIVE mg/dL   Protein, ur NEGATIVE NEGATIVE mg/dL   Nitrite NEGATIVE NEGATIVE   Leukocytes, UA NEGATIVE NEGATIVE   RBC / HPF 0-5 0 - 5 RBC/hpf   WBC, UA 0-5 0 - 5 WBC/hpf   Bacteria, UA NONE SEEN NONE SEEN   Squamous Epithelial / LPF 0-5 0 - 5   Amorphous Crystal PRESENT     Comment: Performed at [facility], [place] [address], [place], [place] [address]  Urine rapid drug screen (hosp performed)     Status: None   Collection Time: 01/26/18  2:49 PM  Result Value Ref Range   Opiates NONE DETECTED NONE DETECTED   Cocaine NONE DETECTED NONE DETECTED   Benzodiazepines NONE DETECTED NONE DETECTED   Amphetamines NONE DETECTED NONE DETECTED   Tetrahydrocannabinol NONE DETECTED NONE DETECTED   Barbiturates NONE DETECTED NONE DETECTED    Comment: (NOTE) DRUG SCREEN FOR MEDICAL PURPOSES ONLY.  IF CONFIRMATION IS NEEDED FOR ANY PURPOSE, NOTIFY LAB WITHIN 5 DAYS. LOWEST DETECTABLE LIMITS FOR URINE DRUG SCREEN Drug Class                     Cutoff (ng/mL) Amphetamine and metabolites    1000 Barbiturate and metabolites    200 Benzodiazepine                 570 Tricyclics and metabolites     300 Opiates and metabolites        300 Cocaine and metabolites        300 THC                            50 Performed at [facility], [place] [address] [place], [place] [address]    Mr Brain Wo Contrast  Result Date: 01/26/2018 CLINICAL DATA:  Nausea, vomiting and dizziness. Foot numbness. History of hyperlipidemia and gastric bypass. EXAM: MRI HEAD WITHOUT CONTRAST TECHNIQUE: Multiplanar, multiecho pulse sequences of the brain and surrounding structures were obtained without intravenous contrast. COMPARISON:  CT neck October 09, 2016 FINDINGS: INTRACRANIAL CONTENTS: No reduced diffusion to suggest acute ischemia. No susceptibility  artifact to suggest hemorrhage. The ventricles and sulci are normal for patient's age. Scattered subcentimeter supratentorial white matter FLAIR T2 hyperintensities compatible with mild chronic small vessel ischemic changes, normal for age. No suspicious parenchymal signal, masses, mass effect. No abnormal extra-axial fluid collections. No extra-axial masses. VASCULAR: Normal major intracranial vascular flow voids present at skull base. SKULL AND UPPER CERVICAL SPINE: No abnormal sellar expansion. No suspicious calvarial bone marrow signal. Craniocervical junction maintained. SINUSES/ORBITS: The mastoid air-cells and included paranasal sinuses are well-aerated.The included ocular globes and orbital contents are non-suspicious. OTHER: None. IMPRESSION: Normal MRI head without contrast for age. Electronically Signed   By: Elon Alas M.D.   On: 01/26/2018 14:04    Pending Labs Unresulted Labs (From admission, onward)    Start     Ordered   01/27/18 9030  Basic metabolic panel  Tomorrow morning,   R     01/26/18 1643   01/26/18 1716  Magnesium  Add-on,   R     01/26/18 1715   01/26/18 1700  HIV antibody (Routine Testing)  Once,   R     01/26/18 1701          Vitals/Pain Today's  Vitals   01/26/18 1544 01/26/18 1545 01/26/18 1549 01/26/18 1750  BP:   (!) 169/100 (!) 179/90  Pulse:   89 87  Resp:   16 17  Temp:      TempSrc:      SpO2: 94%  97% 95%  Weight:      Height:      PainSc:  0-No pain      Isolation Precautions No active isolations  Medications Medications  potassium chloride SA (K-DUR,KLOR-CON) CR tablet 40 mEq (40 mEq Oral Given 01/26/18 1702)  ondansetron (ZOFRAN) injection 4 mg (has no administration in time range)  meclizine (ANTIVERT) tablet 25 mg (0 mg Oral Hold 01/26/18 1812)  raloxifene (EVISTA) tablet 60 mg (has no administration in time range)  liothyronine (CYTOMEL) tablet 5 mcg (has no administration in time range)  levothyroxine (SYNTHROID, LEVOTHROID)  tablet 125 mcg (has no administration in time range)  pantoprazole (PROTONIX) EC tablet 40 mg (has no administration in time range)  enoxaparin (LOVENOX) injection 40 mg (has no administration in time range)  labetalol (NORMODYNE,TRANDATE) injection 10 mg (has no administration in time range)  amLODipine (NORVASC) tablet 10 mg (has no administration in time range)  sodium chloride 0.9 % bolus 1,000 mL (0 mLs Intravenous Stopped 01/26/18 1435)  metoCLOPramide (REGLAN) injection 10 mg (10 mg Intravenous Given 01/26/18 1233)  meclizine (ANTIVERT) tablet 25 mg (25 mg Oral Given 01/26/18 1233)  methylPREDNISolone sodium succinate (SOLU-MEDROL) 125 mg/2 mL injection 125 mg (125 mg Intravenous Given 01/26/18 1452)  meclizine (ANTIVERT) tablet 25 mg (25 mg Oral Given 01/26/18 1452)  sodium chloride 0.9 % bolus 1,000 mL (1,000 mLs Intravenous New Bag/Given 01/26/18 1553)  diazepam (VALIUM) tablet 5 mg (5 mg Oral Given 01/26/18 1548)    Mobility non-ambulatory

## 2018-01-26 NOTE — ED Notes (Signed)
Called to give floor nurse report. Nurse was unable to take report at given time and requested to call back.

## 2018-01-26 NOTE — ED Provider Notes (Signed)
[facility]-EMERGENCY DEPT Provider Note   CSN: [id number] Arrival date & time: 01/26/18  1018     History   Chief Complaint Chief Complaint  Patient presents with  . Dizziness  . Nausea  . Emesis  . Weakness    HPI Lori Durham is a 67 y.o. female.  HPI   Lori Durham is a 67 y.o. female, with a history of GERD, asthma, gastric sleeve, hypothyroidism, presenting to the ED with dizziness, nausea, and vomiting beginning around 7 PM last night.  The symptoms arose after eating, however, her husband ate the same thing and did not have any ill effects.  She has had at least 5 episodes of nonbilious, nonbloody emesis. She describes the dizziness as room spinning and does not change with head or body position.  Denies headache, vision loss, chest pain, shortness of breath, abdominal pain, diarrhea, fever, neck pain, falls/trauma, urinary symptoms, or any other complaints.    Past Medical History:  Diagnosis Date  . Asthma, mild intermittent   . GERD (gastroesophageal reflux disease)    watches diet  . History of colon polyps   . History of diabetes mellitus    no issue since gastric sleeve surgery and lost wt.  . History of exercise stress test    ETT--  06-17-2007--  NORMAL  . History of hypertension    no issue since gastric sleeve surgery and lost wt.  . Hyperlipidemia   . Hypothyroidism, postsurgical   . OSA (obstructive sleep apnea)    MODERATE OSA PER STUDY 09-25-2009--- no longer uses cpap,  no issue since gastric sleeve surgery and lost wt.  . Osteoporosis    last DEXA on 11-20-10 was normal   . PONV (postoperative nausea and vomiting)   . Right knee meniscal tear   . Varicose veins of both lower extremities with pain   . Vitamin D deficiency   . Wears glasses     Patient Active Problem List   Diagnosis Date Noted  . 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 Post-operative state 01/31/2011  . Vaginitis 01/31/2011  . Varicose veins of both lower  extremities with pain   . Vitamin D deficiency   . Obesity   . Colon polyps   . MORBID OBESITY 12/05/2009  . HYPERLIPIDEMIA 10/10/2009  . Asthma 10/10/2009  . LOW BACK PAIN 10/10/2009  . LEG PAIN 10/10/2009  . OSTEOPOROSIS 10/10/2009  . 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 HYPERTENSION 07/13/2008  . GERD 07/13/2008  . WEIGHT GAIN 07/13/2008    Past Surgical History:  Procedure Laterality Date  . COLONOSCOPY  12-07-11   per Dr. Rodena Piety at [facility], clear (hx of adenomatous polyps), repeat in 5 yrs   . KNEE ARTHROSCOPY Right 08/10/2014   Procedure: ARTHROSCOPY RIGHT KNEE WITH DEBRIDEMENT;  Surgeon: Durene Romans, MD;  Location: [facility];  Service: Orthopedics;  Laterality: Right;  . LAPAROSCOPIC CHOLECYSTECTOMY  Nov 2012  [place]  . LAPAROSCOPIC GASTRIC SLEEVE RESECTION  11-15-2009   [place]  . THYROID LOBECTOMY Left 1991   benign  . TONSILLECTOMY AND ADENOIDECTOMY  age 25  . VAGINAL HYSTERECTOMY  1995   w/ ovaries intact     OB History   None      Home Medications    Prior to Admission medications   Medication Sig Start Date End Date Taking? Authorizing Provider  ALPRAZolam Prudy Feeler) 0.25 MG tablet Take 2 tablets (0.5 mg total) by mouth at bedtime. for anxiety Patient taking  differently: Take 0.25 mg by mouth at bedtime. for anxiety 06/10/17  Yes Nelwyn Salisbury, MD  BIOTIN PO Take 1 tablet by mouth daily.   Yes [provider]  Calcium-Magnesium-Vitamin D 600-40-500 MG-MG-UNIT TB24 Take by mouth daily.   Yes [provider]  Cholecalciferol (VITAMIN D3) 1000 UNITS CAPS Take 1 capsule by mouth daily.   Yes [provider]  Coenzyme Q10 (COQ10) 100 MG CAPS Take by mouth once.   Yes [provider]  diphenhydrAMINE (BENADRYL) 25 MG tablet Take 25 mg by mouth at bedtime.   Yes [provider]  fish oil-omega-3 fatty acids 1000 MG capsule Take 2 g by mouth daily.   Yes [provider]    Glucosamine-Chondroit-Vit C-Mn (GLUCOSAMINE 1500 COMPLEX PO) Take 2 tablets by mouth daily.   Yes [provider]  ketoconazole (NIZORAL) 2 % cream Apply 1 application topically 2 (two) times daily. 12/01/17  Yes Nelwyn Salisbury, MD  liothyronine (CYTOMEL) 5 MCG tablet Take 1 tablet (5 mcg total) by mouth daily. 06/10/17  Yes Nelwyn Salisbury, MD  meclizine (ANTIVERT) 25 MG tablet Take 1 tablet (25 mg total) by mouth every 4 (four) hours as needed for dizziness. 03/08/14  Yes Nelwyn Salisbury, MD  Multiple Vitamin (MULTIVITAMIN) tablet Take 1 tablet by mouth daily.   Yes [provider]  omeprazole (PRILOSEC) 20 MG capsule Take 1 capsule (20 mg total) by mouth daily. 06/10/17  Yes Nelwyn Salisbury, MD  Probiotic Product (ALIGN) 4 MG CAPS Take 1 capsule by mouth daily.   Yes [provider]  raloxifene (EVISTA) 60 MG tablet Take 1 tablet (60 mg total) by mouth daily. 06/10/17  Yes Nelwyn Salisbury, MD  SYNTHROID 125 MCG tablet Take 1 tablet (125 mcg total) by mouth daily. 06/10/17  Yes Nelwyn Salisbury, MD  vitamin B-12 (CYANOCOBALAMIN) 1000 MCG tablet Take 1,000 mcg by mouth daily.   Yes [provider]  Zinc 50 MG CAPS Take 1 capsule by mouth daily.   Yes [provider]  acyclovir (ZOVIRAX) 400 MG tablet Take 1 tablet (400 mg total) by mouth as needed. Patient not taking: Reported on 01/26/2018 03/30/16   Nelwyn Salisbury, MD  ALPRAZolam Prudy Feeler) 0.5 MG tablet Take 1 tablet (0.5 mg total) by mouth at bedtime as needed for anxiety. Patient not taking: Reported on 01/26/2018 11/10/17   Nelwyn Salisbury, MD  clarithromycin (BIAXIN) 500 MG tablet Take 1 tablet (500 mg total) by mouth 2 (two) times daily. Patient not taking: Reported on 01/26/2018 06/28/17   Nelwyn Salisbury, MD  diclofenac sodium (VOLTAREN) 1 % GEL Apply 4 g topically 3 (three) times daily as needed. Patient not taking: Reported on 01/26/2018 08/27/14   Nelwyn Salisbury, MD  HYDROcodone-homatropine (HYDROMET) 5-1.5 MG/5ML  syrup Take 5 mLs by mouth every 4 (four) hours as needed. Patient not taking: Reported on 01/26/2018 07/05/17   Nelwyn Salisbury, MD  meloxicam (MOBIC) 15 MG tablet Take 1 tablet (15 mg total) by mouth daily. Patient not taking: Reported on 01/26/2018 06/10/17   Nelwyn Salisbury, MD  methylPREDNISolone (MEDROL DOSEPAK) 4 MG TBPK tablet As directed Patient not taking: Reported on 01/26/2018 12/14/17   Nelwyn Salisbury, MD    Family History Family History  Problem Relation Age of Onset  . Hypothyroidism Mother   . Heart disease Mother   . Hypertension Mother   . Alcohol abuse Father   . Heart disease Maternal Grandmother   .  Alcohol abuse Paternal Grandfather   . Alcohol abuse Brother   . Heart disease Brother   . Heart attack Brother 42  . Alcohol abuse Brother   . Osteoporosis Brother   . Other Brother        varicose veins  . Osteoporosis Brother   . Arthritis Other        family hx of  . Coronary artery disease Other        family history of  . Hyperlipidemia Other        family history of  . Hypertension Other        family history of    Social History Social History   Tobacco Use  . Smoking status: Never Smoker  . Smokeless tobacco: Never Used  Substance Use Topics  . Alcohol use: Yes    Comment: occ  . Drug use: No     Allergies   Augmentin [amoxicillin-pot clavulanate]; Contrast media [iodinated diagnostic agents]; and Hydrocodone   Review of Systems Review of Systems  Constitutional: Negative for chills and fever.  Eyes: Negative for visual disturbance.  Respiratory: Negative for cough and shortness of breath.   Cardiovascular: Negative for chest pain.  Gastrointestinal: Positive for nausea and vomiting. Negative for abdominal pain, blood in stool and diarrhea.  Genitourinary: Negative for dysuria, flank pain, frequency and hematuria.  Musculoskeletal: Negative for back pain, neck pain and neck stiffness.  Neurological: Positive for dizziness. Negative for  syncope, weakness, numbness and headaches.  All other systems reviewed and are negative.    Physical Exam Updated Vital Signs BP (!) 174/96   Pulse 79   Temp 97.9 F (36.6 C) (Oral)   Resp 16   Ht 5\' 5"  (1.651 m)   Wt 113.4 kg   SpO2 96%   BMI 41.60 kg/m   Physical Exam  Constitutional: She appears well-developed and well-nourished. No distress.  HENT:  Head: Normocephalic and atraumatic.  Right Ear: Tympanic membrane, external ear and ear canal normal.  Left Ear: Tympanic membrane, external ear and ear canal normal.  Eyes: Conjunctivae are normal.  Neck: Neck supple.  Cardiovascular: Normal rate, regular rhythm, normal heart sounds and intact distal pulses.  Pulmonary/Chest: Effort normal and breath sounds normal. No respiratory distress.  Abdominal: Soft. There is no tenderness. There is no guarding.  Musculoskeletal: She exhibits no edema.  Lymphadenopathy:    She has no cervical adenopathy.  Neurological: She is alert.  Patient's dizziness is non-fatigable and non-positional.  It is present even when she closes her eyes. There is a question of non-horizontal nystagmus. There does not seem to be any abnormal skew. Sensation grossly intact to light touch in the extremities. Strength 5/5 in all extremities. Ataxia with sitting upright.  Tremoring with finger-to-nose testing with some inaccuracy.  Cranial nerves III-XII grossly intact. No facial droop.   Skin: Skin is warm and dry. She is not diaphoretic.  Psychiatric: She has a normal mood and affect. Her behavior is normal.  Nursing note and vitals reviewed.    ED Treatments / Results  Labs (all labs ordered are listed, but only abnormal results are displayed) Labs Reviewed  COMPREHENSIVE METABOLIC PANEL - Abnormal; Notable for the following components:      Result Value   Potassium 3.0 (*)    Glucose, Bld 120 (*)    All other components within normal limits  URINALYSIS, ROUTINE W REFLEX MICROSCOPIC - Abnormal;  Notable for the following components:   APPearance CLOUDY (*)  pH 9.0 (*)    Ketones, ur 20 (*)    All other components within normal limits  CBC WITH DIFFERENTIAL/PLATELET - Abnormal; Notable for the following components:   MCV 102.9 (*)    MCH 34.8 (*)    All other components within normal limits  CBG MONITORING, ED - Abnormal; Notable for the following components:   Glucose-Capillary 145 (*)    All other components within normal limits  RAPID URINE DRUG SCREEN, HOSP PERFORMED  ETHANOL  TROPONIN I    EKG EKG Interpretation  Date/Time:  Wednesday January 26 2018 10:36:18 EST Ventricular Rate:  79 PR Interval:    QRS Duration: 91 QT Interval:  397 QTC Calculation: 456 R Axis:   63 Text Interpretation:  Sinus rhythm Minimal ST depression, diffuse leads Confirmed by Benjiman Core [id number]) on 01/26/2018 2:53:52 PM   Radiology Mr Brain Wo Contrast  Result Date: 01/26/2018 CLINICAL DATA:  Nausea, vomiting and dizziness. Foot numbness. History of hyperlipidemia and gastric bypass. EXAM: MRI HEAD WITHOUT CONTRAST TECHNIQUE: Multiplanar, multiecho pulse sequences of the brain and surrounding structures were obtained without intravenous contrast. COMPARISON:  CT neck October 09, 2016 FINDINGS: INTRACRANIAL CONTENTS: No reduced diffusion to suggest acute ischemia. No susceptibility artifact to suggest hemorrhage. The ventricles and sulci are normal for patient's age. Scattered subcentimeter supratentorial white matter FLAIR T2 hyperintensities compatible with mild chronic small vessel ischemic changes, normal for age. No suspicious parenchymal signal, masses, mass effect. No abnormal extra-axial fluid collections. No extra-axial masses. VASCULAR: Normal major intracranial vascular flow voids present at skull base. SKULL AND UPPER CERVICAL SPINE: No abnormal sellar expansion. No suspicious calvarial bone marrow signal. Craniocervical junction maintained. SINUSES/ORBITS: The mastoid air-cells  and included paranasal sinuses are well-aerated.The included ocular globes and orbital contents are non-suspicious. OTHER: None. IMPRESSION: Normal MRI head without contrast for age. Electronically Signed   By: Awilda Metro M.D.   On: 01/26/2018 14:04    Procedures Procedures (including critical care time)  Medications Ordered in ED Medications  sodium chloride 0.9 % bolus 1,000 mL (1,000 mLs Intravenous New Bag/Given 01/26/18 1553)  sodium chloride 0.9 % bolus 1,000 mL (0 mLs Intravenous Stopped 01/26/18 1435)  metoCLOPramide (REGLAN) injection 10 mg (10 mg Intravenous Given 01/26/18 1233)  meclizine (ANTIVERT) tablet 25 mg (25 mg Oral Given 01/26/18 1233)  methylPREDNISolone sodium succinate (SOLU-MEDROL) 125 mg/2 mL injection 125 mg (125 mg Intravenous Given 01/26/18 1452)  meclizine (ANTIVERT) tablet 25 mg (25 mg Oral Given 01/26/18 1452)  diazepam (VALIUM) tablet 5 mg (5 mg Oral Given 01/26/18 1548)     Initial Impression / Assessment and Plan / ED Course  I have reviewed the triage vital signs and the nursing notes.  Pertinent labs & imaging results that were available during my care of the patient were reviewed by me and considered in my medical decision making (see chart for details).  Clinical Course as of Jan 26 1610  Wed Jan 26, 2018  1330 No shortness of breath. SPO2 noted to be 95% on RA.  SpO2(!): 82 % [SJ]  1432 Patient states she still feels dizzy with no improvement. She has had some improvement in her nausea, however, and has not had any vomiting since my initial assessment.   [SJ]  1532 Patient reassessed.  Continues to deny nausea.  Her dizziness persists, however, has improved somewhat.  She continues to have some dizziness while lying supine, immediately worsens if moved from this position.   [SJ]  [id number] Spoke with Dr.  Adhikari, hospitalist. States he will come evaluate the patient for admission.   [SJ]    Clinical Course User Index [SJ] Zeeva Courser C, PA-C     Patient presents with dizziness, nausea, and vomiting.  She had some abnormalities on her neurologic exam including some difficulties with coordination.  These findings, combined with her non-fatigable, non-positional dizziness gives indication for MRI.  She has some hypokalemia, which is to be expected with her vomiting, but otherwise lab results are reassuring. Due to the patient's intractable dizziness, recommend admission.  Findings and plan of care discussed with Carmell Austria, MD.   Vitals:   01/26/18 1450 01/26/18 1543 01/26/18 1544 01/26/18 1549  BP: (!) 173/74   (!) 169/100  Pulse: 81   89  Resp: 16   16  Temp:      TempSrc:      SpO2: 94% 92% 94% 97%  Weight:      Height:         Final Clinical Impressions(s) / ED Diagnoses   Final diagnoses:  Dizziness    ED Discharge Orders    None       Concepcion Living 01/26/18 1627    Benjiman Core, MD 01/27/18 1624

## 2018-01-26 NOTE — ED Notes (Signed)
Bed: WA12 Expected date:  Expected time:  Means of arrival:  Comments: EMS-fever 

## 2018-01-26 NOTE — Telephone Encounter (Signed)
Waiting the arrival of the patient to the ED

## 2018-01-26 NOTE — Telephone Encounter (Signed)
Pt called with having nausea, vomiting and dizziness. She describes like vertigo. Started last night with room spinning even while laying down. She is unable to get up now. Vomiting now. Has some numbness in her feet. She had been prescribed meclizine but not for vertigo per pt. She has never been diagnosed with vertigo.  Recommend that she goes to the nearest ED.  Husband feels like he can not get her to the car. And will be calling 911 to go to [facility].  Will route to flow at [facility] at [facility]. Reason for Disposition . SEVERE dizziness (vertigo) (e.g., unable to walk without assistance)  Answer Assessment - Initial Assessment Questions 1. DESCRIPTION: "Describe your dizziness."     Room spinning 2. VERTIGO: "Do you feel like either you or the room is spinning or tilting?"      yes 3. LIGHTHEADED: "Do you feel lightheaded?" (e.g., somewhat faint, woozy, weak upon standing)     lightheaded 4. SEVERITY: "How bad is it?"  "Can you walk?"   - MILD - Feels unsteady but walking normally.   - MODERATE - Feels very unsteady when walking, but not falling; interferes with normal activities (e.g., school, work) .   - SEVERE - Unable to walk without falling (requires assistance).     severe 5. ONSET:  "When did the dizziness begin?"     Last night 6. AGGRAVATING FACTORS: "Does anything make it worse?" (e.g., standing, change in head position)     Movement and standing 7. CAUSE: "What do you think is causing the dizziness?"     vertigo 8. RECURRENT SYMPTOM: "Have you had dizziness before?" If so, ask: "When was the last time?" "What happened that time?"     no 9. OTHER SYMPTOMS: "Do you have any other symptoms?" (e.g., headache, weakness, numbness, vomiting, earache)     Vomiting, numbness in her feet 10. PREGNANCY: "Is there any chance you are pregnant?" "When was your last menstrual period?"       n/a  Protocols used: DIZZINESS - VERTIGO-A-AH

## 2018-01-26 NOTE — Telephone Encounter (Signed)
Pt has arrived [facility] ED.  

## 2018-01-27 DIAGNOSIS — H8110 Benign paroxysmal vertigo, unspecified ear: Secondary | ICD-10-CM | POA: Diagnosis present

## 2018-01-27 DIAGNOSIS — Z8262 Family history of osteoporosis: Secondary | ICD-10-CM | POA: Diagnosis not present

## 2018-01-27 DIAGNOSIS — I10 Essential (primary) hypertension: Secondary | ICD-10-CM

## 2018-01-27 DIAGNOSIS — R42 Dizziness and giddiness: Secondary | ICD-10-CM | POA: Diagnosis present

## 2018-01-27 DIAGNOSIS — Z9049 Acquired absence of other specified parts of digestive tract: Secondary | ICD-10-CM | POA: Diagnosis not present

## 2018-01-27 DIAGNOSIS — E890 Postprocedural hypothyroidism: Secondary | ICD-10-CM | POA: Diagnosis present

## 2018-01-27 DIAGNOSIS — K219 Gastro-esophageal reflux disease without esophagitis: Secondary | ICD-10-CM | POA: Diagnosis present

## 2018-01-27 DIAGNOSIS — Z9884 Bariatric surgery status: Secondary | ICD-10-CM | POA: Diagnosis not present

## 2018-01-27 DIAGNOSIS — E785 Hyperlipidemia, unspecified: Secondary | ICD-10-CM | POA: Diagnosis present

## 2018-01-27 DIAGNOSIS — G4733 Obstructive sleep apnea (adult) (pediatric): Secondary | ICD-10-CM | POA: Diagnosis present

## 2018-01-27 DIAGNOSIS — Z6841 Body Mass Index (BMI) 40.0 and over, adult: Secondary | ICD-10-CM | POA: Diagnosis not present

## 2018-01-27 DIAGNOSIS — Z90710 Acquired absence of both cervix and uterus: Secondary | ICD-10-CM | POA: Diagnosis not present

## 2018-01-27 DIAGNOSIS — E876 Hypokalemia: Secondary | ICD-10-CM

## 2018-01-27 DIAGNOSIS — J4520 Mild intermittent asthma, uncomplicated: Secondary | ICD-10-CM | POA: Diagnosis present

## 2018-01-27 DIAGNOSIS — Z79890 Hormone replacement therapy (postmenopausal): Secondary | ICD-10-CM | POA: Diagnosis not present

## 2018-01-27 DIAGNOSIS — Z8249 Family history of ischemic heart disease and other diseases of the circulatory system: Secondary | ICD-10-CM | POA: Diagnosis not present

## 2018-01-27 DIAGNOSIS — E119 Type 2 diabetes mellitus without complications: Secondary | ICD-10-CM | POA: Diagnosis present

## 2018-01-27 DIAGNOSIS — M810 Age-related osteoporosis without current pathological fracture: Secondary | ICD-10-CM | POA: Diagnosis present

## 2018-01-27 DIAGNOSIS — Z79899 Other long term (current) drug therapy: Secondary | ICD-10-CM | POA: Diagnosis not present

## 2018-01-27 LAB — BASIC METABOLIC PANEL
Anion gap: 8 (ref 5–15)
BUN: 12 mg/dL (ref 8–23)
CALCIUM: 8.8 mg/dL — AB (ref 8.9–10.3)
CHLORIDE: 110 mmol/L (ref 98–111)
CO2: 25 mmol/L (ref 22–32)
CREATININE: 0.64 mg/dL (ref 0.44–1.00)
GFR calc non Af Amer: >60 mL/min (ref >60)
Glucose, Bld: 127 mg/dL — ABNORMAL HIGH (ref 70–99)
Potassium: 3.7 mmol/L (ref 3.5–5.1)
SODIUM: 143 mmol/L (ref 135–145)

## 2018-01-27 LAB — HIV ANTIBODY (ROUTINE TESTING W REFLEX): HIV SCREEN 4TH GENERATION: NONREACTIVE

## 2018-01-27 MED ORDER — POTASSIUM CHLORIDE 20 MEQ PO PACK
40.0000 meq | PACK | Freq: Once | ORAL | Status: AC
  Administered 2018-01-27: 40 meq via ORAL
  Filled 2018-01-27: qty 2

## 2018-01-27 MED ORDER — ONDANSETRON HCL 4 MG/2ML IJ SOLN
4.0000 mg | Freq: Once | INTRAMUSCULAR | Status: AC
  Administered 2018-01-27: 4 mg via INTRAVENOUS
  Filled 2018-01-27: qty 2

## 2018-01-27 NOTE — Progress Notes (Addendum)
PROGRESS NOTE    Lori Durham  ZOX:[id number] DOB: 03/10/1951 DOA: 01/26/2018 PCP: Nelwyn Salisbury, MD (Confirm with patient/family/NH records and if not entered, this HAS to be entered at [facility] point of entry. "No PCP" if truly none.) Outpatient Specialists: (Specify speciality and name if known)    Brief Narrative: Lori Durham is 67 y.o patient admitted because of Vertigo. Has a history of Hypothyroidism postsurgical, HTN, GERD, Osteoporosis, Hypokalemia, morbid obesity. Patient has been experiencing dizziness, nausea, and vomiting for few days. Had no relieve with meclizine. In the ED, MRI was negative for intercranial bleeding.     Assessment & Plan:   Principal Problem:   Vertigo Active Problems:   HYPOTHYROIDISM, POSTSURGICAL   Morbid obesity (HCC)   Essential hypertension   GERD   Osteoporosis   Hypokalemia  Benign proximal positional vertigo: dizziness is improving but not completely gone. Patient was able to get out of bed and able to sit up in chair this morning. However, still has difficulty ambulating because of dizziness. Continue on Meclizine 25 mg, Zofran 4mg  PRN for Nausea. Will assess ambulating ability with PT.    Hypokalemia: Continue on Potassium supplement.   Hypertension: On no medication at home. Started on amlodipine following presenting hypertensive in the ED. Last report BP is 155/81. Continue on Amlodipine 10 mg PO daily. Give Labetalol if BP is >180.   Hypothyroidism, Post surgical: Continue Levothyroxine 125 mcg PO, daily  GERD: Continue on Pantoprazole 40mg  PO daily.   Osteoporosis: Continue on Raloxifene 60 mg PO daily.     DVT prophylaxis: Levonox Code Status: Full Family Communication: No family present at bed side.  Disposition Plan: Will be determine based on ambulating ability and dizziness level.  Consultants: None Procedures: None Antimicrobials: None  Subjective: Patient indicated that her dizziness is improving but not back to  normal. Still has nausea but no vomiting through out the night.   Objective: Vitals:   01/26/18 1851 01/26/18 2210 01/27/18 0450 01/27/18 0653  BP: (!) 180/83 (!) 176/83 (!) 159/85 (!) 155/81  Pulse: 85 84 81 77  Resp: 18 18 18 18   Temp: 98.3 F (36.8 C) 98.4 F (36.9 C) 98.7 F (37.1 C) 98.2 F (36.8 C)  TempSrc: Oral Oral Oral Oral  SpO2: 94% 93% 92% 95%  Weight:      Height:        Intake/Output Summary (Last 24 hours) at 01/27/2018 1052 Last data filed at 01/26/2018 1435 Gross per 24 hour  Intake 1000 ml  Output -  Net 1000 ml   Filed Weights   01/26/18 1028  Weight: 113.4 kg    Examination:  General exam: Appears acutely sick Respiratory system: Clear to auscultation. Respiratory effort normal. Cardiovascular system: S1 & S2 heard, RRR. No JVD, murmurs, rubs, gallops or clicks. No pedal edema. Gastrointestinal system: Abdomen is nondistended, soft and nontender. No organomegaly or masses felt. Normal bowel sounds heard. Central nervous system: Alert and oriented. No focal neurological deficits. Extremities: Symmetric 5 x 5 power. Skin: No rashes, lesions or ulcers Psychiatry: Judgement and insight appear normal. Mood & affect appropriate.     Data Reviewed: I have personally reviewed following labs and imaging studies  CBC: Recent Labs  Lab 01/26/18 1236  WBC 7.4  NEUTROABS 5.4  HGB 14.3  HCT 42.3  MCV 102.9*  PLT 228   Basic Metabolic Panel: Recent Labs  Lab 01/26/18 1236 01/26/18 1953 01/27/18 0443  NA 141  --  143  K 3.0*  --  3.7  CL 104  --  110  CO2 25  --  25  GLUCOSE 120*  --  127*  BUN 12  --  12  CREATININE 0.55  --  0.64  CALCIUM 8.9  --  8.8*  MG  --  2.2  --    GFR: Estimated Creatinine Clearance: 88.1 mL/min (by C-G formula based on SCr of 0.64 mg/dL). Liver Function Tests: Recent Labs  Lab 01/26/18 1236  AST 26  ALT 24  ALKPHOS 68  BILITOT 1.0  PROT 7.0  ALBUMIN 4.1   No results for input(s): LIPASE, AMYLASE in  the last 168 hours. No results for input(s): AMMONIA in the last 168 hours. Coagulation Profile: No results for input(s): INR, PROTIME in the last 168 hours. Cardiac Enzymes: Recent Labs  Lab 01/26/18 1237  TROPONINI <0.03   BNP (last 3 results) No results for input(s): PROBNP in the last 8760 hours. HbA1C: No results for input(s): HGBA1C in the last 72 hours. CBG: Recent Labs  Lab 01/26/18 1037  GLUCAP 145*   Lipid Profile: No results for input(s): CHOL, HDL, LDLCALC, TRIG, CHOLHDL, LDLDIRECT in the last 72 hours. Thyroid Function Tests: No results for input(s): TSH, T4TOTAL, FREET4, T3FREE, THYROIDAB in the last 72 hours. Anemia Panel: No results for input(s): VITAMINB12, FOLATE, FERRITIN, TIBC, IRON, RETICCTPCT in the last 72 hours. Urine analysis:    Component Value Date/Time   COLORURINE YELLOW 01/26/2018 1449   APPEARANCEUR CLOUDY (A) 01/26/2018 1449   LABSPEC 1.013 01/26/2018 1449   PHURINE 9.0 (H) 01/26/2018 1449   GLUCOSEU NEGATIVE 01/26/2018 1449   HGBUR NEGATIVE 01/26/2018 1449   BILIRUBINUR NEGATIVE 01/26/2018 1449   BILIRUBINUR n 06/10/2017 1131   KETONESUR 20 (A) 01/26/2018 1449   PROTEINUR NEGATIVE 01/26/2018 1449   UROBILINOGEN 0.2 06/10/2017 1131   NITRITE NEGATIVE 01/26/2018 1449   LEUKOCYTESUR NEGATIVE 01/26/2018 1449   Sepsis Labs: @LABRCNTIP (procalcitonin:4,lacticidven:4)  )No results found for this or any previous visit (from the past 240 hour(s)).       Radiology Studies: Mr Brain Wo Contrast  Result Date: 01/26/2018 CLINICAL DATA:  Nausea, vomiting and dizziness. Foot numbness. History of hyperlipidemia and gastric bypass. EXAM: MRI HEAD WITHOUT CONTRAST TECHNIQUE: Multiplanar, multiecho pulse sequences of the brain and surrounding structures were obtained without intravenous contrast. COMPARISON:  CT neck October 09, 2016 FINDINGS: INTRACRANIAL CONTENTS: No reduced diffusion to suggest acute ischemia. No susceptibility artifact to suggest  hemorrhage. The ventricles and sulci are normal for patient's age. Scattered subcentimeter supratentorial white matter FLAIR T2 hyperintensities compatible with mild chronic small vessel ischemic changes, normal for age. No suspicious parenchymal signal, masses, mass effect. No abnormal extra-axial fluid collections. No extra-axial masses. VASCULAR: Normal major intracranial vascular flow voids present at skull base. SKULL AND UPPER CERVICAL SPINE: No abnormal sellar expansion. No suspicious calvarial bone marrow signal. Craniocervical junction maintained. SINUSES/ORBITS: The mastoid air-cells and included paranasal sinuses are well-aerated.The included ocular globes and orbital contents are non-suspicious. OTHER: None. IMPRESSION: Normal MRI head without contrast for age. Electronically Signed   By: Awilda Metro M.D.   On: 01/26/2018 14:04        Scheduled Meds: . amLODipine  10 mg Oral Daily  . enoxaparin (LOVENOX) injection  60 mg Subcutaneous Q24H  . levothyroxine  125 mcg Oral Q0600  . liothyronine  5 mcg Oral Daily  . meclizine  25 mg Oral TID  . pantoprazole  40 mg Oral Daily  . raloxifene  60 mg Oral Daily  Continuous Infusions:   LOS: 0 days    Time spent:     Layne Benton, MD [facility] Pager [phone]  If 7PM-7AM, please contact night-coverage [email or web address] Password [email or web address] 01/27/2018, 10:52 AM

## 2018-01-27 NOTE — Evaluation (Signed)
Physical Therapy Evaluation Patient Details Name: Lori Durham MRN: [id number] DOB: 02-Nov-1950 Today's Date: 01/27/2018   History of Present Illness  Lori Durham is a 67 y.o. female with medical history significant of morbid obesity, GERD, hypothyroidism, osteoporosis who presents from home with complaints of dizziness along with nausea and vomiting.  Admitted with vertigo.  Clinical Impression  Patient presents with decreased mobility due to nausea, dizziness, imbalance and decreased activity tolerance.  Currently min A to S for mobility with walker.  Limited vestibular assessment performed due to severity of symptoms, but pt with obvious R beat nystagmus with R gaze indicative of L peripheral vestibular hypofunction likely due to acute vestibular neuritis.  She will benefit from skilled PT in the acute setting to allow return home with intermittent family support and initial follow up HHPT (but recommend outpatient PT referral as well since may take time for scheduling).      Follow Up Recommendations Home health PT;Supervision for mobility/OOB    Equipment Recommendations  Rolling walker with 5" wheels;3in1 (PT)    Recommendations for Other Services       Precautions / Restrictions Precautions Precautions: Fall Restrictions Weight Bearing Restrictions: No      Mobility  Bed Mobility Overal bed mobility: Needs Assistance Bed Mobility: Supine to Sit     Supine to sit: HOB elevated;Min assist     General bed mobility comments: able to sit up on her own, but posterior LOB initially min A to stabilize and cues for targeting stationary item for orientation  Transfers Overall transfer level: Needs assistance Equipment used: Rolling walker (2 wheeled) Transfers: Sit to/from Stand Sit to Stand: Min guard         General transfer comment: cues for hand placement and visual target  Ambulation/Gait Ambulation/Gait assistance: Min assist Gait Distance (Feet): 65  Feet Assistive device: Rolling walker (2 wheeled) Gait Pattern/deviations: Step-through pattern;Step-to pattern;Wide base of support;Decreased stride length     General Gait Details: hesitant and much encouragement needed due to nausea, imbalance, cues for targeting as moving out doorway into hall and with turning, cues for posture and proximity to walker  Stairs            Wheelchair Mobility    Modified Rankin (Stroke Patients Only)       Balance Overall balance assessment: Needs assistance Sitting-balance support: Feet supported;Bilateral upper extremity supported Sitting balance-Leahy Scale: Poor Sitting balance - Comments: initially leaning posterior, reliant on UE support for balance   Standing balance support: Bilateral upper extremity supported Standing balance-Leahy Scale: Poor Standing balance comment: UE support for balance                             Pertinent Vitals/Pain Pain Assessment: No/denies pain    Home Living Family/patient expects to be discharged to:: Private residence Living Arrangements: Spouse/significant other Available Help at Discharge: Family Type of Home: House Home Access: Stairs to enter Entrance Stairs-Rails: None Secretary/administrator of Steps: 3 Home Layout: Two level;Bed/bath upstairs Home Equipment: None      Prior Function Level of Independence: Independent         Comments: works at [employer], fell on 10/10 hit the sink and lip got bruised and seeing a dentist; tangled up in long pajama pants and rug at the sink     Hand Dominance   Dominant Hand: Right    Extremity/Trunk Assessment   Upper Extremity Assessment Upper Extremity  Assessment: Overall WFL for tasks assessed    Lower Extremity Assessment Lower Extremity Assessment: Overall WFL for tasks assessed    Cervical / Trunk Assessment Cervical / Trunk Assessment: Other exceptions Cervical / Trunk Exceptions: head tilted L   Communication   Communication: No difficulties  Cognition Arousal/Alertness: Awake/alert Behavior During Therapy: WFL for tasks assessed/performed Overall Cognitive Status: Within Functional Limits for tasks assessed                                        General Comments    Vestibular Assessment - 01/27/18 0001      Vestibular Assessment   General Observation  Reports dizziness and nausea started Tuesday when getting out of shower.  Has been incapacitated since.  Reports occasionally would have minimal syptoms in the past relieved by one meclinizine.  States no recent URI or hearing changes.  Larey Seat last month hit her mouth on sink when tangled in pants and kitchen rug.       Symptom Behavior   Type of Dizziness  Imbalance   and spinning with head movement   Frequency of Dizziness  intermittent    Duration of Dizziness  several minutes when moving    Aggravating Factors  Activity in general;Turning head sideways    Relieving Factors  Head stationary;Lying supine;Closing eyes;Rest;Slow movements      Occulomotor Exam   Occulomotor Alignment  Normal    Spontaneous  Absent    Gaze-induced  Right beating nystagmus with R gaze    Smooth Pursuits  Intact    Saccades  Intact      Vestibulo-Occular Reflex   VOR 1 Head Only (x 1 viewing)  could not move her head on her own and keep eyes on target, assisted for head movement  and could keep eyes on target    Comment  limited testing due to severity of symptoms      Auditory   Comments  intact and equal bilateral with scatch test         Exercises Other Exercises Other Exercises: Educated on and issued handouts about vestibular neuritis and on gaze stability exercises   Assessment/Plan    PT Assessment Patient needs continued PT services  PT Problem List Decreased mobility;Decreased activity tolerance;Decreased balance;Decreased knowledge of use of DME;Impaired sensation       PT Treatment Interventions DME  instruction;Therapeutic activities;Gait training;Patient/family education;Therapeutic exercise;Balance training;Stair training;Functional mobility training    PT Goals (Current goals can be found in the Care Plan section)  Acute Rehab PT Goals Patient Stated Goal: to return to work ASAP PT Goal Formulation: With patient Time For Goal Achievement: 02/03/18 Potential to Achieve Goals: Good    Frequency Min 3X/week   Barriers to discharge        Co-evaluation               AM-PAC PT "6 Clicks" Daily Activity  Outcome Measure Difficulty turning over in bed (including adjusting bedclothes, sheets and blankets)?: Unable Difficulty moving from lying on back to sitting on the side of the bed? : Unable Difficulty sitting down on and standing up from a chair with arms (e.g., wheelchair, bedside commode, etc,.)?: Unable Help needed moving to and from a bed to chair (including a wheelchair)?: A Little Help needed walking in hospital room?: A Little Help needed climbing 3-5 steps with a railing? : A Little 6 Click Score:  12    End of Session Equipment Utilized During Treatment: Gait belt Activity Tolerance: Patient limited by fatigue Patient left: with call bell/phone within reach;in chair   PT Visit Diagnosis: Other abnormalities of gait and mobility (R26.89);Dizziness and giddiness (R42)    Time: 1610-9604 PT Time Calculation (min) (ACUTE ONLY): 55 min   Charges:   PT Evaluation $PT Eval Moderate Complexity: 1 Mod PT Treatments $Gait Training: 8-22 mins $Neuromuscular Re-education: 8-22 mins        Sheran Lawless, [facility] [phone] 01/27/2018   Elray Mcgregor 01/27/2018, 12:07 PM

## 2018-01-27 NOTE — Care Management Note (Signed)
Case Management Note  Patient Details  Name: Lori Durham MRN: [id number] Date of Birth: March 30, 1950  Subjective/Objective:   Pt admitted with Vertigo                 Action/Plan:   Plan to discharge home with [facility] HHPT   Expected Discharge Date:  (unknown)               Expected Discharge Plan:  Home w Home Health Services  In-House Referral:     Discharge planning Services  CM Consult  Post Acute Care Choice:    Choice offered to:  Patient  DME Arranged:  Bedside commode, Walker rolling DME Agency:  [facility]  HH Arranged:  PT [facility] Agency:  [facility]  Status of Service:  Completed, signed off  If discussed at Long Length of Stay Meetings, dates discussed:    Additional CommentsGeni Bers, RN 01/27/2018, 2:02 PM

## 2018-01-27 NOTE — Progress Notes (Signed)
PROGRESS NOTE    Lori Durham  ZOX:[id number] DOB: 1950/09/17 DOA: 01/26/2018 PCP: Nelwyn Salisbury, MD    Brief Narrative:  67 year old female who presented with vertigo.  He does have significant past medical history for morbid obesity, GERD, hypothyroidism and osteoporosis.  Patient reported sudden onset of vertigo associated with severe nausea and vomiting.  Symptoms are worse with head movement, difficulty her ambulation.  On the initial physical examination her blood pressures was 173/74, heart rate 81, respirate 16, oxygen saturation 92%, moist mucous membranes, lungs clear to auscultation bilaterally, heart S1-S2 present rhythmic, abdomen soft nontender, no lower extremity edema.  Sodium 141, potassium 3.0, chloride 104, bicarb 25, glucose 120, BUN 12, creatinine 0.55, white count 7.4, hemoglobin 14.3, hematocrit 42.3, platelets 228.  Urinalysis negative for infection.  EKG sinus rhythm, normal axis normal intervals.  Patient was admitted to the hospital with the working diagnosis of severe vertigo, complicated by hypokalemia.  Assessment & Plan:   Principal Problem:   Vertigo Active Problems:   HYPOTHYROIDISM, POSTSURGICAL   Morbid obesity (HCC)   Essential hypertension   GERD   Osteoporosis   Hypokalemia   1. Severe vertigo. Patient with persistent and severe symptoms despite head positioning maneuvers, will continue pharmacologic therapy with meclizine. Continue physical therapy evaluation.   2. Hypokalemia. Continue potassium correction with Kcl, this am K 3,7, will give extra 40 kcl to target K above 4. Preserved renal function with serum cr at 0.64. Follow on renal panel in am.  3. Hypothyroid. Continue levothyroxine.  4. HTN. Uncontrolled, patient has been started on amlodipine. Continue blood pressure monitoring.  6. Morbid obesity. Calculated BMI 41.6, will need outpatient follow up.    DVT prophylaxis: enoxaparin   Code Status: full Family Communication: no  family at the bedside  Disposition Plan/ discharge barriers: pending clinical improvement  Body mass index is 41.6 kg/m. Malnutrition Type:      Malnutrition Characteristics:      Nutrition Interventions:     RN Pressure Injury Documentation:     Consultants:     Procedures:     Antimicrobials:       Subjective: Patient has noticed improvement of vertigo but still not back to baseline, having severe symptoms with movement and having difficulty ambulating. No chest pain.   Objective: Vitals:   01/26/18 1851 01/26/18 2210 01/27/18 0450 01/27/18 0653  BP: (!) 180/83 (!) 176/83 (!) 159/85 (!) 155/81  Pulse: 85 84 81 77  Resp: 18 18 18 18   Temp: 98.3 F (36.8 C) 98.4 F (36.9 C) 98.7 F (37.1 C) 98.2 F (36.8 C)  TempSrc: Oral Oral Oral Oral  SpO2: 94% 93% 92% 95%  Weight:      Height:        Intake/Output Summary (Last 24 hours) at 01/27/2018 1220 Last data filed at 01/26/2018 1435 Gross per 24 hour  Intake 1000 ml  Output -  Net 1000 ml   Filed Weights   01/26/18 1028  Weight: 113.4 kg    Examination:   General: deconditioned  Neurology: Awake and alert, non focal  E ENT: no pallor, no icterus, oral mucosa moist Cardiovascular: No JVD. S1-S2 present, rhythmic, no gallops, rubs, or murmurs. No lower extremity edema. Pulmonary: vesicular breath sounds bilaterally, adequate air movement, no wheezing, rhonchi or rales. Gastrointestinal. Abdomen protuberant no organomegaly, non tender, no rebound or guarding Skin. No rashes Musculoskeletal: no joint deformities     Data Reviewed: I have personally reviewed following labs and imaging  studies  CBC: Recent Labs  Lab 01/26/18 1236  WBC 7.4  NEUTROABS 5.4  HGB 14.3  HCT 42.3  MCV 102.9*  PLT 228   Basic Metabolic Panel: Recent Labs  Lab 01/26/18 1236 01/26/18 1953 01/27/18 0443  NA 141  --  143  K 3.0*  --  3.7  CL 104  --  110  CO2 25  --  25  GLUCOSE 120*  --  127*  BUN 12   --  12  CREATININE 0.55  --  0.64  CALCIUM 8.9  --  8.8*  MG  --  2.2  --    GFR: Estimated Creatinine Clearance: 88.1 mL/min (by C-G formula based on SCr of 0.64 mg/dL). Liver Function Tests: Recent Labs  Lab 01/26/18 1236  AST 26  ALT 24  ALKPHOS 68  BILITOT 1.0  PROT 7.0  ALBUMIN 4.1   No results for input(s): LIPASE, AMYLASE in the last 168 hours. No results for input(s): AMMONIA in the last 168 hours. Coagulation Profile: No results for input(s): INR, PROTIME in the last 168 hours. Cardiac Enzymes: Recent Labs  Lab 01/26/18 1237  TROPONINI <0.03   BNP (last 3 results) No results for input(s): PROBNP in the last 8760 hours. HbA1C: No results for input(s): HGBA1C in the last 72 hours. CBG: Recent Labs  Lab 01/26/18 1037  GLUCAP 145*   Lipid Profile: No results for input(s): CHOL, HDL, LDLCALC, TRIG, CHOLHDL, LDLDIRECT in the last 72 hours. Thyroid Function Tests: No results for input(s): TSH, T4TOTAL, FREET4, T3FREE, THYROIDAB in the last 72 hours. Anemia Panel: No results for input(s): VITAMINB12, FOLATE, FERRITIN, TIBC, IRON, RETICCTPCT in the last 72 hours.    Radiology Studies: I have reviewed all of the imaging during this hospital visit personally     Scheduled Meds: . amLODipine  10 mg Oral Daily  . enoxaparin (LOVENOX) injection  60 mg Subcutaneous Q24H  . levothyroxine  125 mcg Oral Q0600  . liothyronine  5 mcg Oral Daily  . meclizine  25 mg Oral TID  . pantoprazole  40 mg Oral Daily  . raloxifene  60 mg Oral Daily   Continuous Infusions:   LOS: 0 days        Mauricio Annett Gula, MD [facility] Pager [phone]

## 2018-01-28 DIAGNOSIS — K219 Gastro-esophageal reflux disease without esophagitis: Secondary | ICD-10-CM

## 2018-01-28 LAB — BASIC METABOLIC PANEL
Anion gap: 8 (ref 5–15)
BUN: 15 mg/dL (ref 8–23)
CALCIUM: 8.8 mg/dL — AB (ref 8.9–10.3)
CO2: 27 mmol/L (ref 22–32)
CREATININE: 0.66 mg/dL (ref 0.44–1.00)
Chloride: 106 mmol/L (ref 98–111)
GFR calc Af Amer: >60 mL/min (ref >60)
GLUCOSE: 104 mg/dL — AB (ref 70–99)
POTASSIUM: 3.5 mmol/L (ref 3.5–5.1)
SODIUM: 141 mmol/L (ref 135–145)

## 2018-01-28 MED ORDER — POTASSIUM CHLORIDE CRYS ER 20 MEQ PO TBCR: 40.0000 meq | EXTENDED_RELEASE_TABLET | Freq: Once | ORAL | Status: DC

## 2018-01-28 MED ORDER — MECLIZINE HCL 25 MG PO TABS: 25.0000 mg | ORAL_TABLET | Freq: Three times a day (TID) | ORAL | 0 refills | Status: DC | PRN

## 2018-01-28 MED ORDER — AMLODIPINE BESYLATE 10 MG PO TABS: 10.0000 mg | ORAL_TABLET | Freq: Every day | ORAL | 0 refills | Status: DC

## 2018-01-28 NOTE — Progress Notes (Signed)
PROGRESS NOTE    Lori Durham  ZOX:[id number] DOB: 06/09/1950 DOA: 01/26/2018 PCP: Nelwyn Salisbury, MD    Brief Narrative:  67 year old female who presented with vertigo.  He does have significant past medical history for morbid obesity, GERD, hypothyroidism and osteoporosis.  Patient reported sudden onset of vertigo associated with severe nausea and vomiting.  Symptoms are worse with head movement, difficulty her ambulation.  On the initial physical examination her blood pressures was 173/74, heart rate 81, respirate 16, oxygen saturation 92%, moist mucous membranes, lungs clear to auscultation bilaterally, heart S1-S2 present rhythmic, abdomen soft nontender, no lower extremity edema.  Sodium 141, potassium 3.0, chloride 104, bicarb 25, glucose 120, BUN 12, creatinine 0.55, white count 7.4, hemoglobin 14.3, hematocrit 42.3, platelets 228.  Urinalysis negative for infection.  EKG sinus rhythm, normal axis normal intervals.  Patient was admitted to the hospital with the working diagnosis of severe vertigo, complicated by hypokalemia.   Assessment & Plan:   Principal Problem:   Vertigo Active Problems:   HYPOTHYROIDISM, POSTSURGICAL   Morbid obesity (HCC)   Essential hypertension   GERD   Osteoporosis   Hypokalemia   1. Severe vertigo. Continue to have severe symptoms despite medical therapy with meclizine. Will nee home health services at discharge.   2. Hypokalemia. K today is 3,5, will continue K correction with Kcl po, will follow on renal panel in am.   3. Hypothyroid. On levothyroxine.  4. HTN. Systolic blood pressure 129 to 151 systolic, will continue amlodipine for now. Continue blood pressure monitoring.   6. Morbid obesity. Calculated BMI 41.6.    DVT prophylaxis: enoxaparin   Code Status: full Family Communication: no family at the bedside  Disposition Plan/ discharge barriers: pending clinical improvement  Body mass index is 41.6 kg/m. Malnutrition  Type:      Malnutrition Characteristics:      Nutrition Interventions:     RN Pressure Injury Documentation:     Consultants:     Procedures:     Antimicrobials:       Subjective: Patient continue to have severe vertigo, worse with movement, associated with ambulatory dysfunction and weakness, mild improvement with meclizine. Persistent symptoms.   Objective: Vitals:   01/27/18 1431 01/27/18 2142 01/28/18 0609 01/28/18 1034  BP: (!) 146/73 (!) 129/56 (!) 153/82 (!) 151/83  Pulse: 69 75 70 63  Resp: 18 14 16    Temp: 98.9 F (37.2 C) 99.2 F (37.3 C) 98.2 F (36.8 C)   TempSrc: Oral Oral Oral   SpO2: 94% 94% 93%   Weight:      Height:        Intake/Output Summary (Last 24 hours) at 01/28/2018 1410 Last data filed at 01/28/2018 0200 Gross per 24 hour  Intake 100 ml  Output -  Net 100 ml   Filed Weights   01/26/18 1028  Weight: 113.4 kg    Examination:   General: deconditioned  Neurology: Awake and alert, non focal  E ENT: mild pallor, no icterus, oral mucosa moist Cardiovascular: No JVD. S1-S2 present, rhythmic, no gallops, rubs, or murmurs. No lower extremity edema. Pulmonary: vesicular breath sounds bilaterally, adequate air movement, no wheezing, rhonchi or rales. Gastrointestinal. Abdomen protuberant, no organomegaly, non tender, no rebound or guarding Skin. No rashes Musculoskeletal: no joint deformities     Data Reviewed: I have personally reviewed following labs and imaging studies  CBC: Recent Labs  Lab 01/26/18 1236  WBC 7.4  NEUTROABS 5.4  HGB 14.3  HCT 42.3  MCV 102.9*  PLT 228   Basic Metabolic Panel: Recent Labs  Lab 01/26/18 1236 01/26/18 1953 01/27/18 0443 01/28/18 0432  NA 141  --  143 141  K 3.0*  --  3.7 3.5  CL 104  --  110 106  CO2 25  --  25 27  GLUCOSE 120*  --  127* 104*  BUN 12  --  12 15  CREATININE 0.55  --  0.64 0.66  CALCIUM 8.9  --  8.8* 8.8*  MG  --  2.2  --   --    GFR: Estimated  Creatinine Clearance: 88.1 mL/min (by C-G formula based on SCr of 0.66 mg/dL). Liver Function Tests: Recent Labs  Lab 01/26/18 1236  AST 26  ALT 24  ALKPHOS 68  BILITOT 1.0  PROT 7.0  ALBUMIN 4.1   No results for input(s): LIPASE, AMYLASE in the last 168 hours. No results for input(s): AMMONIA in the last 168 hours. Coagulation Profile: No results for input(s): INR, PROTIME in the last 168 hours. Cardiac Enzymes: Recent Labs  Lab 01/26/18 1237  TROPONINI <0.03   BNP (last 3 results) No results for input(s): PROBNP in the last 8760 hours. HbA1C: No results for input(s): HGBA1C in the last 72 hours. CBG: Recent Labs  Lab 01/26/18 1037  GLUCAP 145*   Lipid Profile: No results for input(s): CHOL, HDL, LDLCALC, TRIG, CHOLHDL, LDLDIRECT in the last 72 hours. Thyroid Function Tests: No results for input(s): TSH, T4TOTAL, FREET4, T3FREE, THYROIDAB in the last 72 hours. Anemia Panel: No results for input(s): VITAMINB12, FOLATE, FERRITIN, TIBC, IRON, RETICCTPCT in the last 72 hours.    Radiology Studies: I have reviewed all of the imaging during this hospital visit personally     Scheduled Meds: . amLODipine  10 mg Oral Daily  . enoxaparin (LOVENOX) injection  60 mg Subcutaneous Q24H  . levothyroxine  125 mcg Oral Q0600  . liothyronine  5 mcg Oral Daily  . meclizine  25 mg Oral TID  . pantoprazole  40 mg Oral Daily  . raloxifene  60 mg Oral Daily   Continuous Infusions:   LOS: 1 day        [name] Annett Gula, MD [facility] Pager [phone]

## 2018-01-28 NOTE — Discharge Summary (Signed)
Physician Discharge Summary  Lori Durham ZOX:[id number] DOB: Aug 19, 1950 DOA: 01/26/2018  PCP: Nelwyn Salisbury, MD  Admit date: 01/26/2018 Discharge date: 01/28/2018  Admitted From: Home Disposition:  Home   Recommendations for Outpatient Follow-up and new medication changes:  1. Follow up with Dr. Clent Ridges in 7 days.  2. Continue as needed meclizine 25 mg tid prn for vertigo. 3. Patient has been placed on amlodipine for blood pressure control.   Home Health: yes   Equipment/Devices: walker    Discharge Condition: stable  CODE STATUS: full  Diet recommendation: heart healthy   Brief/Interim Summary: 67 year old female who presented with vertigo. She does have significant past medical history for morbid obesity, GERD, hypothyroidism and osteoporosis. Patient reported sudden onset of vertigo associated with severe nausea and vomiting. Symptoms are worse with head movement,difficulting her ambulation. On the initial physical examination her blood pressures was 173/74, heart rate 81, respiratory rate 16, oxygen saturation 92%,moist mucous membranes, lungs clear to auscultation bilaterally, heart S1-S2 present rhythmic, abdomen soft nontender, no lower extremity edema. Sodium 141, potassium 3.0, chloride 104, bicarb 25, glucose 120, BUN 12, creatinine 0.55, white count 7.4, hemoglobin 14.3, hematocrit 42.3,platelets 228.Urinalysis negative for infection. EKG sinus rhythm, normal axis normal intervals.  Patient was admitted to the hospital withtheworking diagnosis of severe vertigo, complicated by hypokalemia.  1.  Severe vertigo.  Patient was admitted to the medical ward, she was placed on intravenous fluids, neurochecks, meclizine every 8 hours, her symptoms were refractive to head positioning maneuvers.  Symptoms slowly improved, she was able to be discharged on November 8, continue as needed meclizine 25 mg every 8 hours.  2.  Hypokalemia.  She received potassium chloride for  potassium correction, her potassium 3.5 on the day of discharge, she received 40 meq before discharge.  3.  Uncontrolled hypertension.  She was placed on amlodipine with good response, follow-up as an outpatient.  Systolic blood pressure at discharge 129-151 mmHg.  4.  Morbid obesity.  Calculated  BMI 41.6 will need follow-up as an outpatient  5.  Hypothyroidism.  Continue levothyroxine.  Discharge Diagnoses:  Principal Problem:   Vertigo Active Problems:   HYPOTHYROIDISM, POSTSURGICAL   Morbid obesity (HCC)   Essential hypertension   GERD   Osteoporosis   Hypokalemia    Discharge Instructions   Allergies as of 01/28/2018      Reactions   Augmentin [amoxicillin-pot Clavulanate] Hives   Has patient had a PCN reaction causing immediate rash, facial/tongue/throat swelling, SOB or lightheadedness with hypotension: Yes Has patient had a PCN reaction causing severe rash involving mucus membranes or skin necrosis: No Has patient had a PCN reaction that required hospitalization: No Has patient had a PCN reaction occurring within the last 10 years: Yes If all of the above answers are "NO", then may proceed with Cephalosporin use.   Contrast Media [iodinated Diagnostic Agents] Hives   Caused skin to burn   Hydrocodone Other (See Comments)   ( cough syrup ) light headed      Medication List    TAKE these medications   ALIGN 4 MG Caps Take 1 capsule by mouth daily.   ALPRAZolam 0.25 MG tablet Commonly known as:  XANAX Take 2 tablets (0.5 mg total) by mouth at bedtime. for anxiety What changed:  how much to take   amLODipine 10 MG tablet Commonly known as:  NORVASC Take 1 tablet (10 mg total) by mouth daily. Start taking on:  01/29/2018   BIOTIN PO Take 1 tablet  by mouth daily.   Calcium-Magnesium-Vitamin D 600-40-500 MG-MG-UNIT Tb24 Take by mouth daily.   CoQ10 100 MG Caps Take by mouth once.   diphenhydrAMINE 25 MG tablet Commonly known as:  BENADRYL Take 25 mg by  mouth at bedtime.   fish oil-omega-3 fatty acids 1000 MG capsule Take 2 g by mouth daily.   GLUCOSAMINE 1500 COMPLEX PO Take 2 tablets by mouth daily.   ketoconazole 2 % cream Commonly known as:  NIZORAL Apply 1 application topically 2 (two) times daily.   liothyronine 5 MCG tablet Commonly known as:  CYTOMEL Take 1 tablet (5 mcg total) by mouth daily.   meclizine 25 MG tablet Commonly known as:  ANTIVERT Take 1 tablet (25 mg total) by mouth 3 (three) times daily as needed for dizziness. What changed:  when to take this   multivitamin tablet Take 1 tablet by mouth daily.   omeprazole 20 MG capsule Commonly known as:  PRILOSEC Take 1 capsule (20 mg total) by mouth daily.   raloxifene 60 MG tablet Commonly known as:  EVISTA Take 1 tablet (60 mg total) by mouth daily.   SYNTHROID 125 MCG tablet Generic drug:  levothyroxine Take 1 tablet (125 mcg total) by mouth daily.   vitamin B-12 1000 MCG tablet Commonly known as:  CYANOCOBALAMIN Take 1,000 mcg by mouth daily.   Vitamin D3 25 MCG (1000 UT) Caps Take 1 capsule by mouth daily.   Zinc 50 MG Caps Take 1 capsule by mouth daily.            Durable Medical Equipment  (From admission, onward)         Start     Ordered   01/27/18 1342  For home use only DME Walker rolling  Once    Question:  Patient needs a walker to treat with the following condition  Answer:  Fear for personal safety   01/27/18 1341   01/27/18 1342  For home use only DME Bedside commode  Once    Question:  Patient needs a bedside commode to treat with the following condition  Answer:  Fear for personal safety   01/27/18 1341          Allergies  Allergen Reactions  . Augmentin [Amoxicillin-Pot Clavulanate] Hives    Has patient had a PCN reaction causing immediate rash, facial/tongue/throat swelling, SOB or lightheadedness with hypotension: Yes Has patient had a PCN reaction causing severe rash involving mucus membranes or skin necrosis:  No Has patient had a PCN reaction that required hospitalization: No Has patient had a PCN reaction occurring within the last 10 years: Yes If all of the above answers are "NO", then may proceed with Cephalosporin use.   . Contrast Media [Iodinated Diagnostic Agents] Hives    Caused skin to burn  . Hydrocodone Other (See Comments)    ( cough syrup ) light headed    Consultations:     Procedures/Studies: Mr Brain Wo Contrast  Result Date: 01/26/2018 CLINICAL DATA:  Nausea, vomiting and dizziness. Foot numbness. History of hyperlipidemia and gastric bypass. EXAM: MRI HEAD WITHOUT CONTRAST TECHNIQUE: Multiplanar, multiecho pulse sequences of the brain and surrounding structures were obtained without intravenous contrast. COMPARISON:  CT neck October 09, 2016 FINDINGS: INTRACRANIAL CONTENTS: No reduced diffusion to suggest acute ischemia. No susceptibility artifact to suggest hemorrhage. The ventricles and sulci are normal for patient's age. Scattered subcentimeter supratentorial white matter FLAIR T2 hyperintensities compatible with mild chronic small vessel ischemic changes, normal for age. No suspicious  parenchymal signal, masses, mass effect. No abnormal extra-axial fluid collections. No extra-axial masses. VASCULAR: Normal major intracranial vascular flow voids present at skull base. SKULL AND UPPER CERVICAL SPINE: No abnormal sellar expansion. No suspicious calvarial bone marrow signal. Craniocervical junction maintained. SINUSES/ORBITS: The mastoid air-cells and included paranasal sinuses are well-aerated.The included ocular globes and orbital contents are non-suspicious. OTHER: None. IMPRESSION: Normal MRI head without contrast for age. Electronically Signed   By: Awilda Metro M.D.   On: 01/26/2018 14:04       Subjective: Patient symptoms have improved, continue to have mild vertigo with movement.  Patient ambulate with physical therapy.  Discharge Exam: Vitals:   01/28/18 0609  01/28/18 1034  BP: (!) 153/82 (!) 151/83  Pulse: 70 63  Resp: 16   Temp: 98.2 F (36.8 C)   SpO2: 93%    Vitals:   01/27/18 1431 01/27/18 2142 01/28/18 0609 01/28/18 1034  BP: (!) 146/73 (!) 129/56 (!) 153/82 (!) 151/83  Pulse: 69 75 70 63  Resp: 18 14 16    Temp: 98.9 F (37.2 C) 99.2 F (37.3 C) 98.2 F (36.8 C)   TempSrc: Oral Oral Oral   SpO2: 94% 94% 93%   Weight:      Height:        General: Not in pain or dyspnea  Neurology: Awake and alert, non focal  E ENT: no pallor, no icterus, oral mucosa moist Cardiovascular: No JVD. S1-S2 present, rhythmic, no gallops, rubs, or murmurs. No lower extremity edema. Pulmonary: vesicular breath sounds bilaterally, adequate air movement, no wheezing, rhonchi or rales. Gastrointestinal. Abdomen protuberant with no organomegaly, non tender, no rebound or guarding Skin. No rashes Musculoskeletal: no joint deformities   The results of significant diagnostics from this hospitalization (including imaging, microbiology, ancillary and laboratory) are listed below for reference.     Microbiology: No results found for this or any previous visit (from the past 240 hour(s)).   Labs: BNP (last 3 results) No results for input(s): BNP in the last 8760 hours. Basic Metabolic Panel: Recent Labs  Lab 01/26/18 1236 01/26/18 1953 01/27/18 0443 01/28/18 0432  NA 141  --  143 141  K 3.0*  --  3.7 3.5  CL 104  --  110 106  CO2 25  --  25 27  GLUCOSE 120*  --  127* 104*  BUN 12  --  12 15  CREATININE 0.55  --  0.64 0.66  CALCIUM 8.9  --  8.8* 8.8*  MG  --  2.2  --   --    Liver Function Tests: Recent Labs  Lab 01/26/18 1236  AST 26  ALT 24  ALKPHOS 68  BILITOT 1.0  PROT 7.0  ALBUMIN 4.1   No results for input(s): LIPASE, AMYLASE in the last 168 hours. No results for input(s): AMMONIA in the last 168 hours. CBC: Recent Labs  Lab 01/26/18 1236  WBC 7.4  NEUTROABS 5.4  HGB 14.3  HCT 42.3  MCV 102.9*  PLT 228   Cardiac  Enzymes: Recent Labs  Lab 01/26/18 1237  TROPONINI <0.03   BNP: Invalid input(s): POCBNP CBG: Recent Labs  Lab 01/26/18 1037  GLUCAP 145*   D-Dimer No results for input(s): DDIMER in the last 72 hours. Hgb A1c No results for input(s): HGBA1C in the last 72 hours. Lipid Profile No results for input(s): CHOL, HDL, LDLCALC, TRIG, CHOLHDL, LDLDIRECT in the last 72 hours. Thyroid function studies No results for input(s): TSH, T4TOTAL, T3FREE, THYROIDAB in  the last 72 hours.  Invalid input(s): FREET3 Anemia work up No results for input(s): VITAMINB12, FOLATE, FERRITIN, TIBC, IRON, RETICCTPCT in the last 72 hours. Urinalysis    Component Value Date/Time   COLORURINE YELLOW 01/26/2018 1449   APPEARANCEUR CLOUDY (A) 01/26/2018 1449   LABSPEC 1.013 01/26/2018 1449   PHURINE 9.0 (H) 01/26/2018 1449   GLUCOSEU NEGATIVE 01/26/2018 1449   HGBUR NEGATIVE 01/26/2018 1449   BILIRUBINUR NEGATIVE 01/26/2018 1449   BILIRUBINUR n 06/10/2017 1131   KETONESUR 20 (A) 01/26/2018 1449   PROTEINUR NEGATIVE 01/26/2018 1449   UROBILINOGEN 0.2 06/10/2017 1131   NITRITE NEGATIVE 01/26/2018 1449   LEUKOCYTESUR NEGATIVE 01/26/2018 1449   Sepsis Labs Invalid input(s): PROCALCITONIN,  WBC,  LACTICIDVEN Microbiology No results found for this or any previous visit (from the past 240 hour(s)).   Time coordinating discharge: 45 minutes  SIGNED:   Coralie Keens, MD  [facility] 01/28/2018, 5:05 PM Pager [phone]  If 7PM-7AM, please contact night-coverage [email or web address] Password [email or web address]

## 2018-01-28 NOTE — Progress Notes (Signed)
Physical Therapy Treatment Patient Details Name: Lori Durham MRN: [id number] DOB: [birth year] Today's Date: 01/28/2018    History of Present Illness Lori Durham is a 67 y.o. female with medical history significant of morbid obesity, GERD, hypothyroidism, osteoporosis who presents from home with complaints of dizziness along with nausea and vomiting.  Admitted with vertigo.    PT Comments    Pt ambulated in hallway with cues for gazing at stationary objects.  Pt reports 7-8/10 dizziness however has not had Meclizine since last night.  RN notified as pt requesting this.  Pt had paperwork from previous therapist on diagnosis and gaze stability exercises so encouraged pt to read over these upon d/c and attempt exercises after Meclizine (since she reports meclizine does help and she will tolerate exercises better).  Pt agreeable and reports possible d/c home today.       Follow Up Recommendations  Home health PT;Supervision for mobility/OOB (HHPT familiar with vestibular disorders)     Equipment Recommendations  Rolling walker with 5" wheels;3in1 (PT)    Recommendations for Other Services       Precautions / Restrictions Precautions Precautions: Fall    Mobility  Bed Mobility Overal bed mobility: Needs Assistance Bed Mobility: Supine to Sit     Supine to sit: Supervision;HOB elevated        Transfers Overall transfer level: Needs assistance Equipment used: Rolling walker (2 wheeled) Transfers: Sit to/from Stand Sit to Stand: Min guard         General transfer comment: cues for hand placement and visual target  Ambulation/Gait Ambulation/Gait assistance: Min guard Gait Distance (Feet): 60 Feet Assistive device: Rolling walker (2 wheeled) Gait Pattern/deviations: Step-through pattern;Decreased stride length;Wide base of support     General Gait Details: slow but steady, reports 7-8/10 dizziness, cues for stationary targets, cues for RW positioning   Stairs              Wheelchair Mobility    Modified Rankin (Stroke Patients Only)       Balance                                            Cognition Arousal/Alertness: Awake/alert Behavior During Therapy: WFL for tasks assessed/performed Overall Cognitive Status: Within Functional Limits for tasks assessed                                        Exercises      General Comments        Pertinent Vitals/Pain Pain Assessment: No/denies pain    Home Living                      Prior Function            PT Goals (current goals can now be found in the care plan section) Progress towards PT goals: Progressing toward goals    Frequency    Min 3X/week      PT Plan Current plan remains appropriate    Co-evaluation              AM-PAC PT "6 Clicks" Daily Activity  Outcome Measure  Difficulty turning over in bed (including adjusting bedclothes, sheets and blankets)?: A Lot Difficulty moving from lying on back to sitting on the  side of the bed? : A Lot Difficulty sitting down on and standing up from a chair with arms (e.g., wheelchair, bedside commode, etc,.)?: Unable Help needed moving to and from a bed to chair (including a wheelchair)?: A Little Help needed walking in hospital room?: A Little Help needed climbing 3-5 steps with a railing? : A Little 6 Click Score: 14    End of Session Equipment Utilized During Treatment: Gait belt Activity Tolerance: Patient tolerated treatment well Patient left: with call bell/phone within reach;in chair   PT Visit Diagnosis: Other abnormalities of gait and mobility (R26.89);Dizziness and giddiness (R42)     Time: 4098-1191 PT Time Calculation (min) (ACUTE ONLY): 22 min  Charges:  $Gait Training: 8-22 mins                     Zenovia Jarred, PT, DPT Acute Rehabilitation Services Office: [phone] Pager: [phone]  Sarajane Jews 01/28/2018, 1:45 PM

## 2018-02-01 ENCOUNTER — Other Ambulatory Visit: Payer: Self-pay

## 2018-02-01 ENCOUNTER — Encounter: Payer: Self-pay | Admitting: Family Medicine

## 2018-02-01 ENCOUNTER — Telehealth: Payer: Self-pay | Admitting: Family Medicine

## 2018-02-01 NOTE — Telephone Encounter (Signed)
Copied from CRM [id number]. Topic: Quick Communication - Home Health Verbal Orders >> Feb 01, 2018  3:27 PM Marylen PontoMcneil, Ja-Kwan wrote: Caller/Agency: Marcelino DusterMichelle / [facility] Callback Number: [phone] Requesting OT/PT/Skilled Nursing/Social Work: skilled nursing Frequency: 1 time a week for 3 weeks

## 2018-02-01 NOTE — Patient Outreach (Signed)
[facility] Care Management  02/01/2018  Lori Durham 1950/11/19 [id number]  EMMI: general discharge red alert Referral date:  02/01/18 Referral reason: scheduled follow up: no Insurance: health team advantage Day # 1  Telephone call to patient regarding EMMI general discharge red alert. HIPAA verified with patient. Explained reason for call. Patient states she has not scheduled a follow up yet with her doctor because she is not able to go out yet. Patient states she will schedule this when she is able. Patient states she has home health services. Patient states she has her medications and is taking them as prescribed. Patient reports having transportation when she is able to get to her doctors appointments.  Patient denies any further needs/ concerns at this time.  RNCM advised patient to notify MD of any changes in condition prior to scheduled appointment. RNCM provided contact name and number: [phone] or main office number [phone] and 24 hour nurse advise line [phone] by mail.  Address confirmed with patient. Marland Kitchen.  RNCM verified patient aware of 911 services for urgent/ emergent needs.,/  PLAN:  RNCM will close patient due to being assessed and having no further needs.  RNCM will send [facility] care [facility] brochure/ magnet RNCM will send closure letter to patients primary MD   George InaDavina Aliyana Dlugosz RN,BSN,CCM [facility] Telephonic  [phone]

## 2018-02-02 DIAGNOSIS — E890 Postprocedural hypothyroidism: Secondary | ICD-10-CM | POA: Diagnosis not present

## 2018-02-02 DIAGNOSIS — E785 Hyperlipidemia, unspecified: Secondary | ICD-10-CM | POA: Diagnosis not present

## 2018-02-02 DIAGNOSIS — J4520 Mild intermittent asthma, uncomplicated: Secondary | ICD-10-CM | POA: Diagnosis not present

## 2018-02-02 DIAGNOSIS — G4733 Obstructive sleep apnea (adult) (pediatric): Secondary | ICD-10-CM | POA: Diagnosis not present

## 2018-02-02 DIAGNOSIS — I10 Essential (primary) hypertension: Secondary | ICD-10-CM | POA: Diagnosis not present

## 2018-02-02 DIAGNOSIS — Z9884 Bariatric surgery status: Secondary | ICD-10-CM | POA: Diagnosis not present

## 2018-02-02 DIAGNOSIS — M810 Age-related osteoporosis without current pathological fracture: Secondary | ICD-10-CM | POA: Diagnosis not present

## 2018-02-02 DIAGNOSIS — H8110 Benign paroxysmal vertigo, unspecified ear: Secondary | ICD-10-CM | POA: Diagnosis not present

## 2018-02-02 DIAGNOSIS — I83813 Varicose veins of bilateral lower extremities with pain: Secondary | ICD-10-CM | POA: Diagnosis not present

## 2018-02-02 DIAGNOSIS — K219 Gastro-esophageal reflux disease without esophagitis: Secondary | ICD-10-CM | POA: Diagnosis not present

## 2018-02-02 NOTE — Telephone Encounter (Signed)
Tresa EndoKelly w/[facility] [phone] needs a verbal order today for PT for one visit this week and two visits next week, because the patient is suffering from severe vertigo. She could barely make it through testing her and with her taking medication for it.  Also the patient will be out of Meclivine medication on Friday.

## 2018-02-02 NOTE — Telephone Encounter (Signed)
Dr Fry please advise. thanks 

## 2018-02-03 ENCOUNTER — Other Ambulatory Visit: Payer: Self-pay | Admitting: Family Medicine

## 2018-02-03 NOTE — Telephone Encounter (Signed)
Patient called to request home health care to come in to her home. She was discharged from the hospital a few days ago with Severe Vertigo and is not able to do much inside the home and is unable to come out for a follow up visit with the doctor. Patient have also ran out of medication meclizine (ANTIVERT) 25 MG tablet a refill request was placed. Please advise Ph# [phone]

## 2018-02-03 NOTE — Telephone Encounter (Signed)
Copied from CRM [id number]. Topic: Quick Communication - Rx Refill/Question >> Feb 03, 2018  8:54 AM Jaquita Rectoravis, Karen A wrote: Medication: meclizine (ANTIVERT) 25 MG tablet  Has the patient contacted their pharmacy? Yes.     Preferred Pharmacy (with phone number or street name): [id number] [place], [place] - [address] [phone] (Phone) [phone] (Fax)    Agent: Please be advised that RX refills may take up to 3 business days. We ask that you follow-up with your pharmacy.

## 2018-02-03 NOTE — Telephone Encounter (Signed)
Tresa EndoKelly, PT is calling back about getting the verbal okay to see patient one visit this week and 2 visits next week. She would like to see the patient today. Please advise.   [phone]

## 2018-02-03 NOTE — Telephone Encounter (Signed)
Requested medication (s) are due for refill today: yes  Requested medication (s) are on the active medication list: yes    Last refill: 01/28/18 #20  0 refills  Future visit scheduled no  Notes to clinic:Not delegated    Historical Provider  Requested Prescriptions  Pending Prescriptions Disp Refills   meclizine (ANTIVERT) 25 MG tablet 20 tablet 0    Sig: Take 1 tablet (25 mg total) by mouth 3 (three) times daily as needed for dizziness.     Not Delegated - Gastroenterology: Antiemetics Failed - 02/03/2018  9:30 AM      Failed - This refill cannot be delegated      Passed - Valid encounter within last 6 months    Recent Outpatient Visits          1 month ago Allergic reaction due to correct medicinal substance properly administered   [facility] Tera MaterStephen A, [facility]   3 months ago Acute non-recurrent frontal sinusitis   [facility], Aldine ContesStephen O, MD   7 months ago Community acquired pneumonia of right lower lobe of lung [facility](HCC)   [facility] Tera MaterStephen A, MD   7 months ago Acute bronchitis, unspecified organism   [facility], Tera MaterStephen A, MD   7 months ago Preventative health care   [facility] Tera MaterStephen A, MD

## 2018-02-04 ENCOUNTER — Other Ambulatory Visit: Payer: Self-pay

## 2018-02-04 MED ORDER — MECLIZINE HCL 25 MG PO TABS: ORAL_TABLET | ORAL | 5 refills | Status: DC

## 2018-02-04 NOTE — Telephone Encounter (Signed)
Lurena JoinerRebecca calling and is needing to get the skilled nursing orders approved. Orders were originally requested on 02/01/18.   1x a week for 3 weeks and this is to start next week. Please advise. CB#: [phone]

## 2018-02-04 NOTE — Telephone Encounter (Signed)
Please okay these orders and also call in Meclizine 25 mg to take every 4 hours prn dizziness, #60 with 5 rf

## 2018-02-04 NOTE — Telephone Encounter (Signed)
I called Tresa EndoKelly and left a detailed message with verbal orders as below per Dr Clent RidgesFry.  I also called the pt and informed her [facility]

## 2018-02-04 NOTE — Telephone Encounter (Signed)
This was already called in

## 2018-02-04 NOTE — Patient Outreach (Signed)
[facility] Care Management  02/04/2018  Lori Durham 14-Nov-1950 [id number]  EMMI: general discharge red alert Referral date:  02/01/18, 02/03/18 Referral reason: scheduled follow up: no Insurance: health team advantage Day # 4  Telephone call to patient regarding EMMI general discharge red alert. HIPAA verified with patient. Patient states she is unable to schedule a follow up appointment until she is able to step out of her house.  Patient states she is not able to get out at this time due to her vertigo.  Patient states she continues to have home health follow up. Patient states she will schedule a follow up once she is able to do so. Patient reports having her medications and taking them as prescribed. Patient states she does not have any further needs/ concerns.  RNCM advised patient to notify MD of any changes in condition prior to scheduled appointment. RNCM verified patient aware of 911 services for urgent/ emergent needs. Patient verbalized understanding.   PLAN; RNCM will close patient due to patient being assessed and having no further needs.   Lori InaDavina Marili Vader RN,BSN,CCM [facility] Telephonic  [phone]

## 2018-02-04 NOTE — Telephone Encounter (Signed)
Please approve these orders

## 2018-02-04 NOTE — Telephone Encounter (Signed)
Pt calling back about the RX Antivert please give her a call back at [phone] when RX has been sent

## 2018-02-08 NOTE — Telephone Encounter (Signed)
Verbal orders given  

## 2018-02-11 ENCOUNTER — Ambulatory Visit (INDEPENDENT_AMBULATORY_CARE_PROVIDER_SITE_OTHER): Payer: PPO | Admitting: Family Medicine

## 2018-02-11 ENCOUNTER — Encounter: Payer: Self-pay | Admitting: Family Medicine

## 2018-02-11 VITALS — BP 124/82 | HR 89 | Temp 98.2°F | Wt 249.4 lb

## 2018-02-11 DIAGNOSIS — R42 Dizziness and giddiness: Secondary | ICD-10-CM | POA: Diagnosis not present

## 2018-02-11 MED ORDER — AMLODIPINE BESYLATE 10 MG PO TABS: 10.0000 mg | ORAL_TABLET | Freq: Every day | ORAL | 3 refills | Status: DC

## 2018-02-11 MED ORDER — METHYLPREDNISOLONE 4 MG PO TBPK: ORAL_TABLET | ORAL | 1 refills | Status: DC

## 2018-02-11 NOTE — Telephone Encounter (Signed)
Dr. Fry please advise. Thanks  

## 2018-02-11 NOTE — Progress Notes (Signed)
   Subjective:    Patient ID: Lori Durham, female    DOB: 10-31-1950, 67 y.o.   MRN: [id number]  HPI Here to follow up a hospital stay from 01-26-18 to 01-28-18 for vertigo. This came on suddenly and was quite severe. Her workup included a normal brain MRI. She had nausea and vomiting at first but not now. She had 3 home PT visits for vestibular rehab, and she has been doing her exercises. She is still fairly dizzy and she cannot drive. She has been out of work since 01-26-18. Her BP was also elevated in the hospital, so she was started on Amlodipine. Her BP has been stable since then.    Review of Systems  Constitutional: Negative.   Respiratory: Negative.   Cardiovascular: Negative.   Neurological: Positive for dizziness. Negative for headaches.       Objective:   Physical Exam  Constitutional: She is oriented to person, place, and time. She appears well-developed and well-nourished.  HENT:  Head: Normocephalic and atraumatic.  Eyes: Pupils are equal, round, and reactive to light. Conjunctivae and EOM are normal.  Neck: No thyromegaly present.  Cardiovascular: Normal rate, regular rhythm, normal heart sounds and intact distal pulses.  Pulmonary/Chest: Effort normal and breath sounds normal.  Lymphadenopathy:    She has no cervical adenopathy.  Neurological: She is alert and oriented to person, place, and time. No cranial nerve deficit. She exhibits normal muscle tone.  Mildly unsteady on her feet, touches the walls when walking           Assessment & Plan:  Vertigo. She will use Meclizine prn. She will take a Medrol dose pack. Stay on Amlodipine for the BP. Written out of work from 01-26-18 until 02-28-18.  Gershon CraneStephen Heer Justiss, MD

## 2018-02-11 NOTE — Telephone Encounter (Signed)
She was seen OV today  

## 2018-02-11 NOTE — Telephone Encounter (Signed)
Tresa EndoKelly called in and stated patient has refused PT orders and stated she was feeling better. And Tresa EndoKelly also wants to know if the pt can be placed on a low dose steroid to help with her neuritis .  CB# [phone] Tresa Endo( [facility])

## 2018-02-25 ENCOUNTER — Ambulatory Visit (INDEPENDENT_AMBULATORY_CARE_PROVIDER_SITE_OTHER): Payer: PPO | Admitting: Family Medicine

## 2018-02-25 ENCOUNTER — Encounter: Payer: Self-pay | Admitting: Family Medicine

## 2018-02-25 VITALS — BP 150/74 | HR 91 | Temp 98.2°F | Wt 248.8 lb

## 2018-02-25 DIAGNOSIS — R42 Dizziness and giddiness: Secondary | ICD-10-CM | POA: Diagnosis not present

## 2018-02-25 NOTE — Progress Notes (Signed)
   Subjective:    Patient ID: Lori Durham, female    DOB: October 13, 1950, 67 y.o.   MRN: [id number]  HPI Here to follow up on vertigo. She has taken a Medrol dose pack and is using Meclizine but she is not better. She still cannot work.    Review of Systems  Constitutional: Negative.   Respiratory: Negative.   Cardiovascular: Negative.   Neurological: Positive for dizziness. Negative for headaches.       Objective:   Physical Exam  Constitutional: She is oriented to person, place, and time. She appears well-developed and well-nourished.  Cardiovascular: Normal rate, regular rhythm, normal heart sounds and intact distal pulses.  Pulmonary/Chest: Effort normal and breath sounds normal.  Neurological: She is alert and oriented to person, place, and time. She exhibits normal muscle tone.  Slightly unstable gait           Assessment & Plan:  Vertigo, refer to Neurology. We have written her out of work from 01-26-18 until 03-28-18.  Gershon CraneStephen Fry, MD

## 2018-02-28 ENCOUNTER — Encounter: Payer: Self-pay | Admitting: Neurology

## 2018-03-28 ENCOUNTER — Encounter: Payer: Self-pay | Admitting: Family Medicine

## 2018-03-28 ENCOUNTER — Ambulatory Visit (INDEPENDENT_AMBULATORY_CARE_PROVIDER_SITE_OTHER): Payer: PPO | Admitting: Family Medicine

## 2018-03-28 VITALS — BP 138/80 | HR 80 | Temp 98.8°F | Wt 251.0 lb

## 2018-03-28 DIAGNOSIS — R42 Dizziness and giddiness: Secondary | ICD-10-CM | POA: Diagnosis not present

## 2018-03-28 LAB — BASIC METABOLIC PANEL
BUN: 20 mg/dL (ref 6–23)
CALCIUM: 9.5 mg/dL (ref 8.4–10.5)
CHLORIDE: 101 meq/L (ref 96–112)
CO2: 29 meq/L (ref 19–32)
Creatinine, Ser: 0.61 mg/dL (ref 0.40–1.20)
GFR: 104.55 mL/min (ref >60.00)
Glucose, Bld: 93 mg/dL (ref 70–99)
Potassium: 3.7 mEq/L (ref 3.5–5.1)
Sodium: 139 mEq/L (ref 135–145)

## 2018-03-28 LAB — VITAMIN B12: Vitamin B-12: 226 pg/mL (ref 211–911)

## 2018-03-28 NOTE — Progress Notes (Signed)
   Subjective:    Patient ID: Lori Durham, female    DOB: Dec 19, 1950, 68 y.o.   MRN: [id number]  HPI Here to follow up on dizziness. She really has not improved at all since her last visit. She still feels well when sitting still, but when she moves around at all she feels very dizzy. She has started to use a cane at home to keep from falling. She has no new or different symptoms.    Review of Systems  Constitutional: Negative.   Respiratory: Negative.   Cardiovascular: Negative.   Neurological: Positive for dizziness. Negative for tremors, seizures, syncope, facial asymmetry, speech difficulty, weakness, light-headedness, numbness and headaches.       Objective:   Physical Exam Constitutional:      Appearance: Normal appearance.     Comments: Walks with a wide based gait   Neck:     Musculoskeletal: Normal range of motion. No neck rigidity.  Cardiovascular:     Rate and Rhythm: Normal rate and regular rhythm.     Pulses: Normal pulses.     Heart sounds: Normal heart sounds.  Pulmonary:     Effort: Pulmonary effort is normal.     Breath sounds: Normal breath sounds.  Lymphadenopathy:     Cervical: No cervical adenopathy.  Neurological:     General: No focal deficit present.     Mental Status: She is oriented to person, place, and time.     Cranial Nerves: No cranial nerve deficit.     Motor: No weakness.           Assessment & Plan:  Vertigo. We will recheck a BMET today and check a B12 level. She is scheduled to see Dr. Everlena Cooper for a neurologic evaluation on 04-22-18. Disability paperwork was filled to keep her out of work for the time being.  Gershon Crane, MD

## 2018-03-31 ENCOUNTER — Encounter: Payer: Self-pay | Admitting: Family Medicine

## 2018-04-01 ENCOUNTER — Encounter: Payer: Self-pay | Admitting: *Deleted

## 2018-04-04 ENCOUNTER — Ambulatory Visit (INDEPENDENT_AMBULATORY_CARE_PROVIDER_SITE_OTHER): Payer: PPO | Admitting: *Deleted

## 2018-04-04 DIAGNOSIS — E538 Deficiency of other specified B group vitamins: Secondary | ICD-10-CM | POA: Diagnosis not present

## 2018-04-04 MED ORDER — CYANOCOBALAMIN 1000 MCG/ML IJ SOLN
1000.0000 ug | Freq: Once | INTRAMUSCULAR | Status: AC
  Administered 2018-04-04: 1000 ug via INTRAMUSCULAR

## 2018-04-04 NOTE — Progress Notes (Signed)
Per orders of Dr. Fry, injection of Vit B12 given by Kaydra Borgen M. Patient tolerated injection well.  

## 2018-04-11 ENCOUNTER — Ambulatory Visit: Payer: PPO

## 2018-04-11 ENCOUNTER — Ambulatory Visit (INDEPENDENT_AMBULATORY_CARE_PROVIDER_SITE_OTHER): Payer: PPO | Admitting: *Deleted

## 2018-04-11 DIAGNOSIS — E538 Deficiency of other specified B group vitamins: Secondary | ICD-10-CM

## 2018-04-11 MED ORDER — CYANOCOBALAMIN 1000 MCG/ML IJ SOLN
1000.0000 ug | Freq: Once | INTRAMUSCULAR | Status: AC
  Administered 2018-04-11: 1000 ug via INTRAMUSCULAR

## 2018-04-18 ENCOUNTER — Ambulatory Visit (INDEPENDENT_AMBULATORY_CARE_PROVIDER_SITE_OTHER): Payer: PPO | Admitting: *Deleted

## 2018-04-18 DIAGNOSIS — E538 Deficiency of other specified B group vitamins: Secondary | ICD-10-CM | POA: Diagnosis not present

## 2018-04-18 MED ORDER — CYANOCOBALAMIN 1000 MCG/ML IJ SOLN
1000.0000 ug | Freq: Once | INTRAMUSCULAR | Status: AC
  Administered 2018-04-18: 1000 ug via INTRAMUSCULAR

## 2018-04-18 NOTE — Progress Notes (Signed)
Per orders of Dr. Fry, injection of Vit B12 given by Kolston Lacount M. Patient tolerated injection well.  

## 2018-04-20 NOTE — Progress Notes (Signed)
NEUROLOGY CONSULTATION NOTE  Lori HangKathy K Durham MRN: [id number] DOB: 1950/10/06  Referring provider: Gershon CraneStephen Fry, MD Primary care provider: Gershon CraneStephen Fry, MD  Reason for consult:  Dizziness/vertigo  HISTORY OF PRESENT ILLNESS: Lori GoreKathy Durham is a 68 year old right-handed woman with morbid obesity, hypothyroidism, osteoporosis and GERD who presents for dizziness.  History supplemented by hospital and referring provider notes.  Lori Durham has prior history of vertigo, responds to meclizine and occurs once or twice a year.  However, she had a different and more severe episode in November.  [facility] for sudden onset severe spinning sensation which occurred after stepping out of the shower.  It was associated with nausea and several episodes of vomiting.  She denied associated chest pain, shortness of breath, palpitations, blurred or double vision, change in hearing, tinnitus, headache or unilateral numbness or weakness.  Sometimes she reports aural fullness, especially the right ear.  Blood pressure on initial examination was 173/74 with pulse of 81.  MRI of brain without contrast was personally reviewed and was normal.  CBC and CMP were unremarkable.  Troponins were negative.  She was treated with IV fluids and meclizine which improved her symptoms.  She was discharged on meclizine and amlodipine in stable condition.  She has been treated by physical therapy for vestibular rehabilitation which was not too effective.  She did not respond to Medrol dose pack.  Nausea and vomiting resolved but she continued to feel dizzy.  B12 level was 226 and she was started on supplementation.  The initial event lasted 2 weeks.  She continues to have intermittent vertigo.  When she lays down, the room spins for 1 to 2 minutes and subsided.  Turning on her side may aggravate it as well.  She notes associated headaches as well, described as bilateral periorbital moderate nonthrobbing pain  that responds to Extra-strength Tylenol.  Headaches occur 3 to 4 times a week and lasts within an hour.  No associated symptoms.  No prior history of headaches.  However, her son has migraines.    PAST MEDICAL HISTORY: Past Medical History:  Diagnosis Date  . Asthma, mild intermittent   . GERD (gastroesophageal reflux disease)    watches diet  . History of colon polyps   . History of diabetes mellitus    no issue since gastric sleeve surgery and lost wt.  . History of exercise stress test    ETT--  06-17-2007--  NORMAL  . History of hypertension    no issue since gastric sleeve surgery and lost wt.  . Hyperlipidemia   . Hypothyroidism, postsurgical   . OSA (obstructive sleep apnea)    MODERATE OSA PER STUDY 09-25-2009--- no longer uses cpap,  no issue since gastric sleeve surgery and lost wt.  . Osteoporosis    last DEXA on 11-20-10 was normal   . PONV (postoperative nausea and vomiting)   . Right knee meniscal tear   . Varicose veins of both lower extremities with pain   . Vitamin D deficiency   . Wears glasses     PAST SURGICAL HISTORY: Past Surgical History:  Procedure Laterality Date  . COLONOSCOPY  12-07-11   [facility], clear (hx of adenomatous polyps), repeat in 5 yrs   . KNEE ARTHROSCOPY Right 08/10/2014   Procedure: ARTHROSCOPY RIGHT KNEE WITH DEBRIDEMENT;  Surgeon: Durene RomansMatthew Olin, MD;  [facility];  Service: Orthopedics;  Laterality: Right;  . LAPAROSCOPIC CHOLECYSTECTOMY  Nov  2012  [place]  . LAPAROSCOPIC GASTRIC SLEEVE RESECTION  11-15-2009   [place]  . THYROID LOBECTOMY Left 1991   benign  . TONSILLECTOMY AND ADENOIDECTOMY  age 89  . VAGINAL HYSTERECTOMY  1995   w/ ovaries intact    MEDICATIONS: Current Outpatient Medications on File Prior to Visit  Medication Sig Dispense Refill  . ALPRAZolam (XANAX) 0.25 MG tablet Take 2 tablets (0.5 mg total) by mouth at bedtime. for anxiety (Patient taking differently: Take  0.25 mg by mouth at bedtime. for anxiety) 90 tablet 5  . amLODipine (NORVASC) 10 MG tablet Take 1 tablet (10 mg total) by mouth daily. 90 tablet 3  . BIOTIN PO Take 1 tablet by mouth daily.    . Calcium-Magnesium-Vitamin D 600-40-500 MG-MG-UNIT TB24 Take by mouth daily.    . Cholecalciferol (VITAMIN D3) 1000 UNITS CAPS Take 1 capsule by mouth daily.    . Coenzyme Q10 (COQ10) 100 MG CAPS Take by mouth once.    . diphenhydrAMINE (BENADRYL) 25 MG tablet Take 25 mg by mouth at bedtime.    . fish oil-omega-3 fatty acids 1000 MG capsule Take 2 g by mouth daily.    . Glucosamine-Chondroit-Vit C-Mn (GLUCOSAMINE 1500 COMPLEX PO) Take 2 tablets by mouth daily.    Marland Kitchen. ketoconazole (NIZORAL) 2 % cream Apply 1 application topically 2 (two) times daily. 15 g 0  . liothyronine (CYTOMEL) 5 MCG tablet Take 1 tablet (5 mcg total) by mouth daily. 90 tablet 3  . meclizine (ANTIVERT) 25 MG tablet Take 1 tablet (25 mg total) by mouth 3 (three) times daily as needed for dizziness. 20 tablet 0  . methylPREDNISolone (MEDROL DOSEPAK) 4 MG TBPK tablet As directed 21 tablet 1  . Multiple Vitamin (MULTIVITAMIN) tablet Take 1 tablet by mouth daily.    Marland Kitchen. omeprazole (PRILOSEC) 20 MG capsule Take 1 capsule (20 mg total) by mouth daily. 30 capsule 3  . Probiotic Product (ALIGN) 4 MG CAPS Take 1 capsule by mouth daily.    . raloxifene (EVISTA) 60 MG tablet Take 1 tablet (60 mg total) by mouth daily. 90 tablet 3  . SYNTHROID 125 MCG tablet Take 1 tablet (125 mcg total) by mouth daily. 90 tablet 3  . vitamin B-12 (CYANOCOBALAMIN) 1000 MCG tablet Take 1,000 mcg by mouth daily.    . Zinc 50 MG CAPS Take 1 capsule by mouth daily.     No current facility-administered medications on file prior to visit.     ALLERGIES: Allergies  Allergen Reactions  . Augmentin [Amoxicillin-Pot Clavulanate] Hives    Has patient had a PCN reaction causing immediate rash, facial/tongue/throat swelling, SOB or lightheadedness with hypotension: Yes Has  patient had a PCN reaction causing severe rash involving mucus membranes or skin necrosis: No Has patient had a PCN reaction that required hospitalization: No Has patient had a PCN reaction occurring within the last 10 years: Yes If all of the above answers are "NO", then may proceed with Cephalosporin use.   . Contrast Media [Iodinated Diagnostic Agents] Hives    Caused skin to burn  . Hydrocodone Other (See Comments)    ( cough syrup ) light headed    FAMILY HISTORY: Family History  Problem Relation Age of Onset  . Hypothyroidism Mother   . Heart disease Mother   . Hypertension Mother   . Alcohol abuse Father   . Heart disease Maternal Grandmother   . Alcohol abuse Paternal Grandfather   . Alcohol abuse Brother   .  Heart disease Brother   . Heart attack Brother 42  . Alcohol abuse Brother   . Osteoporosis Brother   . Other Brother        varicose veins  . Osteoporosis Brother   . Arthritis Other        family hx of  . Coronary artery disease Other        family history of  . Hyperlipidemia Other        family history of  . Hypertension Other        family history of    SOCIAL HISTORY: Social History   Socioeconomic History  . Marital status: Married    Spouse name: Not on file  . Number of children: Not on file  . Years of education: Not on file  . Highest education level: Not on file  Occupational History  . Not on file  Social Needs  . Financial resource strain: Not on file  . Food insecurity:    Worry: Not on file    Inability: Not on file  . Transportation needs:    Medical: Not on file    Non-medical: Not on file  Tobacco Use  . Smoking status: Never Smoker  . Smokeless tobacco: Never Used  Substance and Sexual Activity  . Alcohol use: Yes    Comment: occ  . Drug use: No  . Sexual activity: Not on file  Lifestyle  . Physical activity:    Days per week: Not on file    Minutes per session: Not on file  . Stress: Not on file  Relationships  .  Social connections:    Talks on phone: Not on file    Gets together: Not on file    Attends religious service: Not on file    Active member of club or organization: Not on file    Attends meetings of clubs or organizations: Not on file    Relationship status: Not on file  . Intimate partner violence:    Fear of current or ex partner: Not on file    Emotionally abused: Not on file    Physically abused: Not on file    Forced sexual activity: Not on file  Other Topics Concern  . Not on file  Social History Narrative  . Not on file    REVIEW OF SYSTEMS: Constitutional: No fevers, chills, or sweats, no generalized fatigue, change in appetite Eyes: No visual changes, double vision, eye pain Ear, nose and throat: No hearing loss, ear pain, nasal congestion, sore throat Cardiovascular: No chest pain, palpitations Respiratory:  No shortness of breath at rest or with exertion, wheezes GastrointestinaI: No nausea, vomiting, diarrhea, abdominal pain, fecal incontinence Genitourinary:  No dysuria, urinary retention or frequency Musculoskeletal:  No neck pain, back pain Integumentary: No rash, pruritus, skin lesions Neurological: as above Psychiatric: No depression, insomnia, anxiety Endocrine: No palpitations, fatigue, diaphoresis, mood swings, change in appetite, change in weight, increased thirst Hematologic/Lymphatic:  No purpura, petechiae. Allergic/Immunologic: no itchy/runny eyes, nasal congestion, recent allergic reactions, rashes  PHYSICAL EXAM: Blood pressure 140/80, pulse 75, height 5\' 5"  (1.651 m), weight 252 lb (114.3 kg), SpO2 97 %. General: No acute distress.  Patient appears well-groomed.   Head:  Normocephalic/atraumatic Eyes:  fundi examined but not visualized Neck: supple, no paraspinal tenderness, full range of motion Back: No paraspinal tenderness Heart: regular rate and rhythm Lungs: Clear to auscultation bilaterally. Vascular: No carotid bruits. Neurological  Exam: Mental status: alert and oriented to person, place,  and time, recent and remote memory intact, fund of knowledge intact, attention and concentration intact, speech fluent and not dysarthric, language intact. Cranial nerves: CN I: not tested CN II: pupils equal, round and reactive to light, visual fields intact CN III, IV, VI:  full range of motion, no nystagmus, no ptosis CN V: facial sensation intact CN VII: upper and lower face symmetric CN VIII: hearing intact CN IX, X: gag intact, uvula midline CN XI: sternocleidomastoid and trapezius muscles intact CN XII: tongue midline Bulk & Tone: normal, no fasciculations. Motor:  5/5 throughout  Sensation:  temperature and vibration sensation intact. Deep Tendon Reflexes:  2+ throughout, toes downgoing.  Finger to nose testing:  Without dysmetria.   Heel to shin:  Without dysmetria.   Gait:  Normal station and stride.  Romberg with sway.  IMPRESSION: Chronic intermittent vertigo.  Semiology consistent with BPPV, perhaps delayed response to the fall.  However, it has not responded to vestibular rehab and has not yet resolved.  Vestibular migraine possible but no history of migraine and current headache seems more consistent with tension-type.  Symptoms not totally consistent with vestibular neuritis.  Symptoms not consistent with Meneire's  PLAN: 1.  We will try treating chronic vertigo with sertraline 25mg  daily.   2.  I would like her evaluated by ENT for their impression. 3.  Short-term disability filled out. 4.  She will follow up with me.  Thank you for allowing me to take part in the care of this patient.  Shon Millet, DO  CC: Gershon Crane, MD

## 2018-04-22 ENCOUNTER — Encounter: Payer: Self-pay | Admitting: Neurology

## 2018-04-22 ENCOUNTER — Ambulatory Visit: Payer: PPO | Admitting: Neurology

## 2018-04-22 VITALS — BP 140/80 | HR 75 | Ht 65.0 in | Wt 252.0 lb

## 2018-04-22 DIAGNOSIS — G44219 Episodic tension-type headache, not intractable: Secondary | ICD-10-CM

## 2018-04-22 DIAGNOSIS — R42 Dizziness and giddiness: Secondary | ICD-10-CM | POA: Diagnosis not present

## 2018-04-22 MED ORDER — SERTRALINE HCL 25 MG PO TABS: 25.0000 mg | ORAL_TABLET | Freq: Every day | ORAL | 3 refills | Status: DC

## 2018-04-22 NOTE — Patient Instructions (Signed)
1.  Start sertraline 25mg  daily 2.  I will refer you to ENT for further evaluation of vertigo 3.  Short-term disability filled out 4.  Follow up in 3 to 4 months.

## 2018-04-25 ENCOUNTER — Ambulatory Visit: Payer: PPO

## 2018-04-25 ENCOUNTER — Ambulatory Visit (INDEPENDENT_AMBULATORY_CARE_PROVIDER_SITE_OTHER): Payer: PPO | Admitting: *Deleted

## 2018-04-25 DIAGNOSIS — E538 Deficiency of other specified B group vitamins: Secondary | ICD-10-CM

## 2018-04-25 MED ORDER — CYANOCOBALAMIN 1000 MCG/ML IJ SOLN
1000.0000 ug | Freq: Once | INTRAMUSCULAR | Status: AC
  Administered 2018-04-25: 1000 ug via INTRAMUSCULAR

## 2018-04-25 NOTE — Progress Notes (Signed)
Per orders of Dr. Fry, injection of Vit B12 given by GREEN, [name]. Patient tolerated injection well.  

## 2018-04-26 ENCOUNTER — Telehealth: Payer: Self-pay | Admitting: Neurology

## 2018-04-26 ENCOUNTER — Ambulatory Visit: Payer: PPO

## 2018-04-26 NOTE — Telephone Encounter (Signed)
Patient called in about referral for Dr. Laveda Abbe. Patient said that she called the practice and that they are closing down and not taking new patients. Please call her back at [phone]. She wants to know what next steps are. Thanks!

## 2018-04-27 ENCOUNTER — Ambulatory Visit: Payer: PPO

## 2018-04-27 NOTE — Telephone Encounter (Signed)
Yes

## 2018-04-27 NOTE — Telephone Encounter (Signed)
Called and advised Pt 

## 2018-05-02 ENCOUNTER — Ambulatory Visit (INDEPENDENT_AMBULATORY_CARE_PROVIDER_SITE_OTHER): Payer: PPO | Admitting: *Deleted

## 2018-05-02 DIAGNOSIS — E538 Deficiency of other specified B group vitamins: Secondary | ICD-10-CM | POA: Diagnosis not present

## 2018-05-02 MED ORDER — CYANOCOBALAMIN 1000 MCG/ML IJ SOLN
1000.0000 ug | Freq: Once | INTRAMUSCULAR | Status: AC
  Administered 2018-05-02: 1000 ug via INTRAMUSCULAR

## 2018-05-02 NOTE — Progress Notes (Signed)
Per orders of Dr. Fry, injection of Vit B12 given by GREEN, [name]. Patient tolerated injection well.  

## 2018-05-09 ENCOUNTER — Ambulatory Visit (INDEPENDENT_AMBULATORY_CARE_PROVIDER_SITE_OTHER): Payer: PPO | Admitting: *Deleted

## 2018-05-09 DIAGNOSIS — E538 Deficiency of other specified B group vitamins: Secondary | ICD-10-CM | POA: Diagnosis not present

## 2018-05-09 MED ORDER — CYANOCOBALAMIN 1000 MCG/ML IJ SOLN
1000.0000 ug | Freq: Once | INTRAMUSCULAR | Status: AC
  Administered 2018-05-09: 1000 ug via INTRAMUSCULAR

## 2018-05-09 NOTE — Progress Notes (Signed)
.  Per orders of Dr. Fry, injection of Vit b12 given by Bryan Omura M. Patient tolerated injection well.  

## 2018-05-11 ENCOUNTER — Telehealth: Payer: Self-pay | Admitting: Family Medicine

## 2018-05-11 DIAGNOSIS — R42 Dizziness and giddiness: Secondary | ICD-10-CM

## 2018-05-11 NOTE — Telephone Encounter (Signed)
Copied from CRM [id number]. Topic: Referral - Request for Referral >> May 11, 2018 11:49 AM Maia Petties wrote: Has patient seen PCP for this complaint? Yes - kind of per pt - pt was referred to Neuro and Dr. Everlena Cooper advised to see ENT but referrals never went to the correct MD - she does not want to deal with [facility] as they said they cannot treat her for vertigo *If NO, is insurance requiring patient see PCP for this issue before PCP can refer them? Yes - Health Team Adv Referral for which specialty: ENT Preferred provider/office: Dr. Osborn Coho at [facility] Reason for referral: vertigo

## 2018-05-12 NOTE — Telephone Encounter (Signed)
I did the referral to Dr. Annalee Genta

## 2018-05-12 NOTE — Telephone Encounter (Signed)
Patient is aware 

## 2018-05-16 ENCOUNTER — Ambulatory Visit (INDEPENDENT_AMBULATORY_CARE_PROVIDER_SITE_OTHER): Payer: PPO | Admitting: *Deleted

## 2018-05-16 DIAGNOSIS — E538 Deficiency of other specified B group vitamins: Secondary | ICD-10-CM

## 2018-05-16 MED ORDER — CYANOCOBALAMIN 1000 MCG/ML IJ SOLN
1000.0000 ug | Freq: Once | INTRAMUSCULAR | Status: AC
  Administered 2018-05-16: 1000 ug via INTRAMUSCULAR

## 2018-05-16 NOTE — Progress Notes (Signed)
Per orders of Dr. Fry, injection of Vit B12 given by Zeah Germano M. Patient tolerated injection well.  

## 2018-05-23 ENCOUNTER — Ambulatory Visit (INDEPENDENT_AMBULATORY_CARE_PROVIDER_SITE_OTHER): Payer: PPO | Admitting: *Deleted

## 2018-05-23 DIAGNOSIS — E538 Deficiency of other specified B group vitamins: Secondary | ICD-10-CM

## 2018-05-23 MED ORDER — CYANOCOBALAMIN 1000 MCG/ML IJ SOLN
1000.0000 ug | Freq: Once | INTRAMUSCULAR | Status: AC
  Administered 2018-05-23: 1000 ug via INTRAMUSCULAR

## 2018-05-23 NOTE — Progress Notes (Signed)
Per orders of Dr. Fry, injection of Vit B12 given by GREEN, [name]. Patient tolerated injection well.  

## 2018-05-28 ENCOUNTER — Other Ambulatory Visit: Payer: Self-pay | Admitting: Family Medicine

## 2018-05-30 ENCOUNTER — Ambulatory Visit (INDEPENDENT_AMBULATORY_CARE_PROVIDER_SITE_OTHER): Payer: PPO | Admitting: *Deleted

## 2018-05-30 DIAGNOSIS — H8112 Benign paroxysmal vertigo, left ear: Secondary | ICD-10-CM | POA: Diagnosis not present

## 2018-05-30 DIAGNOSIS — E538 Deficiency of other specified B group vitamins: Secondary | ICD-10-CM | POA: Diagnosis not present

## 2018-05-30 DIAGNOSIS — H81399 Other peripheral vertigo, unspecified ear: Secondary | ICD-10-CM | POA: Diagnosis not present

## 2018-05-30 MED ORDER — CYANOCOBALAMIN 1000 MCG/ML IJ SOLN
1000.0000 ug | Freq: Once | INTRAMUSCULAR | Status: AC
  Administered 2018-05-30: 1000 ug via INTRAMUSCULAR

## 2018-05-30 NOTE — Progress Notes (Signed)
Per orders of Dr. Fry, injection of Vit B12 given by GREEN, [name]. Patient tolerated injection well.  

## 2018-06-04 ENCOUNTER — Other Ambulatory Visit: Payer: Self-pay | Admitting: Family Medicine

## 2018-06-06 ENCOUNTER — Other Ambulatory Visit: Payer: Self-pay

## 2018-06-06 ENCOUNTER — Ambulatory Visit (INDEPENDENT_AMBULATORY_CARE_PROVIDER_SITE_OTHER): Payer: PPO | Admitting: General Practice

## 2018-06-06 DIAGNOSIS — E538 Deficiency of other specified B group vitamins: Secondary | ICD-10-CM | POA: Diagnosis not present

## 2018-06-06 MED ORDER — CYANOCOBALAMIN 1000 MCG/ML IJ SOLN
1000.0000 ug | Freq: Once | INTRAMUSCULAR | Status: AC
  Administered 2018-06-06: 1000 ug via INTRAMUSCULAR

## 2018-06-06 NOTE — Progress Notes (Addendum)
After obtaining consent, and per orders of Dr. Clent Ridges, injection of B12 given by Garrison Columbus. Patient instructed to remain in clinic for 20 minutes afterwards, and to report any adverse reaction to me immediately.   I agree with this injection. Gershon Crane, MD

## 2018-06-07 ENCOUNTER — Encounter: Payer: Self-pay | Admitting: Family Medicine

## 2018-06-13 ENCOUNTER — Ambulatory Visit: Payer: PPO

## 2018-06-13 ENCOUNTER — Telehealth: Payer: Self-pay

## 2018-06-13 ENCOUNTER — Encounter: Payer: Self-pay | Admitting: Family Medicine

## 2018-06-13 MED ORDER — ALPRAZOLAM 0.25 MG PO TABS: ORAL_TABLET | ORAL | 5 refills | Status: DC

## 2018-06-13 NOTE — Telephone Encounter (Signed)
Pt advised. Nothing further needed.   

## 2018-06-13 NOTE — Telephone Encounter (Signed)
The Alprazolam is 0.25 mg. Call in #[phone] with 5 rf

## 2018-06-13 NOTE — Telephone Encounter (Signed)
Dr. Clent Ridges please advise on the alprazolam refill. Thanks

## 2018-06-13 NOTE — Telephone Encounter (Signed)
NVs for B12 have been cancelled as we are not doing injections in office at this time. LMTCB to advise pt.   Dr. Clent Ridges - Please advise on oral/SL B12 rx for pt instead of injection. Thanks!

## 2018-06-13 NOTE — Telephone Encounter (Signed)
I agree. Stop the shots. Have her take OTC B12 to take 1000 mcg daily sublingual. Check a level in 90 days

## 2018-06-15 ENCOUNTER — Encounter: Payer: PPO | Admitting: Family Medicine

## 2018-06-20 ENCOUNTER — Ambulatory Visit: Payer: PPO

## 2018-06-27 ENCOUNTER — Ambulatory Visit: Payer: PPO

## 2018-06-29 ENCOUNTER — Encounter: Payer: Self-pay | Admitting: Family Medicine

## 2018-07-04 ENCOUNTER — Telehealth: Payer: Self-pay | Admitting: *Deleted

## 2018-07-04 ENCOUNTER — Encounter: Payer: Self-pay | Admitting: Family Medicine

## 2018-07-04 NOTE — Telephone Encounter (Signed)
Copied from CRM [id number]. Topic: General - Other >> Jul 01, 2018  2:24 PM Gwenlyn Fudge A wrote: Reason for CRM: Pt called requesting to know what Dr. Clent Ridges put in his letter to insurance because the request had been denied. Pt states she is not able to take a generic of Synthroid. Please advise.

## 2018-07-04 NOTE — Telephone Encounter (Signed)
No PA needed per the pt.  See my chart message

## 2018-07-04 NOTE — Telephone Encounter (Signed)
I do not see that I did a PA on this. Do you remember seeing this?

## 2018-07-06 ENCOUNTER — Encounter: Payer: Self-pay | Admitting: Family Medicine

## 2018-07-06 ENCOUNTER — Ambulatory Visit (INDEPENDENT_AMBULATORY_CARE_PROVIDER_SITE_OTHER): Payer: PPO | Admitting: Family Medicine

## 2018-07-06 ENCOUNTER — Other Ambulatory Visit: Payer: Self-pay

## 2018-07-06 DIAGNOSIS — G4700 Insomnia, unspecified: Secondary | ICD-10-CM | POA: Diagnosis not present

## 2018-07-06 DIAGNOSIS — E890 Postprocedural hypothyroidism: Secondary | ICD-10-CM | POA: Diagnosis not present

## 2018-07-06 MED ORDER — ALPRAZOLAM 0.5 MG PO TABS: 0.5000 mg | ORAL_TABLET | Freq: Every day | ORAL | 1 refills | Status: DC

## 2018-07-06 NOTE — Progress Notes (Signed)
Subjective:    Patient ID: Lori Durham, female    DOB: 08-Nov-1950, 68 y.o.   MRN: [id number]  HPI Virtual Visit via Video Note  I connected with the patient on 07/06/18 at 11:00 AM EDT by a video enabled telemedicine application and verified that I am speaking with the correct person using two identifiers.  Location patient: home Location provider:work or home office Persons participating in the virtual visit: patient, provider  I discussed the limitations of evaluation and management by telemedicine and the availability of in person appointments. The patient expressed understanding and agreed to proceed.   HPI: Her to discuss medications. First she has been getting name brand Synthroid for several years, but this is now too expensive to continue doing. She wants to switch to generic. She just picked up another 90 day supply of brand name last month. Also she has been taking two tablets of 0.25 mg Xanax at bedtime to help with sleep, but she wants to switch to a single 0.5 mg tablet to save costs.    ROS: See pertinent positives and negatives per HPI.  Past Medical History:  Diagnosis Date  . Asthma, mild intermittent   . GERD (gastroesophageal reflux disease)    watches diet  . History of colon polyps   . History of diabetes mellitus    no issue since gastric sleeve surgery and lost wt.  . History of exercise stress test    ETT--  06-17-2007--  NORMAL  . History of hypertension    no issue since gastric sleeve surgery and lost wt.  . Hyperlipidemia   . Hypothyroidism, postsurgical   . OSA (obstructive sleep apnea)    MODERATE OSA PER STUDY 09-25-2009--- no longer uses cpap,  no issue since gastric sleeve surgery and lost wt.  . Osteoporosis    last DEXA on 11-20-10 was normal   . PONV (postoperative nausea and vomiting)   . Right knee meniscal tear   . Varicose veins of both lower extremities with pain   . Vitamin D deficiency   . Wears glasses     Past Surgical  History:  Procedure Laterality Date  . COLONOSCOPY  12-07-11   [facility] (hx of adenomatous polyps), repeat in 5 yrs   . KNEE ARTHROSCOPY Right 08/10/2014   Procedure: ARTHROSCOPY RIGHT KNEE WITH DEBRIDEMENT;  Surgeon: Durene RomansMatthew Olin, MD;  [facility];  Service: Orthopedics;  Laterality: Right;  . LAPAROSCOPIC CHOLECYSTECTOMY  Nov 2012  [place]  . LAPAROSCOPIC GASTRIC SLEEVE RESECTION  11-15-2009   [place]  . THYROID LOBECTOMY Left 1991   benign  . TONSILLECTOMY AND ADENOIDECTOMY  age 89  . VAGINAL HYSTERECTOMY  1995   w/ ovaries intact    Family History  Problem Relation Age of Onset  . Hypothyroidism Mother   . Heart disease Mother   . Hypertension Mother   . Alcohol abuse Father   . Heart disease Maternal Grandmother   . Alcohol abuse Paternal Grandfather   . Alcohol abuse Brother   . Heart disease Brother   . Heart attack Brother 42  . Alcohol abuse Brother   . Osteoporosis Brother   . Other Brother        varicose veins  . Osteoporosis Brother   . Arthritis Other        family hx of  . Coronary artery disease Other        family history of  .  Hyperlipidemia Other        family history of  . Hypertension Other        family history of     Current Outpatient Medications:  .  amLODipine (NORVASC) 10 MG tablet, Take 1 tablet (10 mg total) by mouth daily., Disp: 90 tablet, Rfl: 3 .  BIOTIN PO, Take 1 tablet by mouth daily., Disp: , Rfl:  .  Calcium-Magnesium-Vitamin D 600-40-500 MG-MG-UNIT TB24, Take by mouth daily., Disp: , Rfl:  .  Cholecalciferol (VITAMIN D3) 1000 UNITS CAPS, Take 1 capsule by mouth daily., Disp: , Rfl:  .  Coenzyme Q10 (COQ10) 100 MG CAPS, Take by mouth once., Disp: , Rfl:  .  fish oil-omega-3 fatty acids 1000 MG capsule, Take 2 g by mouth daily., Disp: , Rfl:  .  Glucosamine-Chondroit-Vit C-Mn (GLUCOSAMINE 1500 COMPLEX PO), Take 2 tablets by mouth daily., Disp: , Rfl:  .  ketoconazole  (NIZORAL) 2 % cream, Apply 1 application topically 2 (two) times daily., Disp: 15 g, Rfl: 0 .  liothyronine (CYTOMEL) 5 MCG tablet, TAKE 1 TABLET BY MOUTH EVERY DAY, Disp: 90 tablet, Rfl: 3 .  Multiple Vitamin (MULTIVITAMIN) tablet, Take 1 tablet by mouth daily., Disp: , Rfl:  .  omeprazole (PRILOSEC) 20 MG capsule, Take 1 capsule (20 mg total) by mouth daily., Disp: 30 capsule, Rfl: 3 .  Probiotic Product (ALIGN) 4 MG CAPS, Take 1 capsule by mouth daily., Disp: , Rfl:  .  raloxifene (EVISTA) 60 MG tablet, TAKE 1 TABLET BY MOUTH EVERY DAY, Disp: 90 tablet, Rfl: 3 .  sertraline (ZOLOFT) 25 MG tablet, Take 1 tablet (25 mg total) by mouth daily., Disp: 30 tablet, Rfl: 3 .  SYNTHROID 125 MCG tablet, TAKE 1 TABLET BY MOUTH EVERY DAY, Disp: 90 tablet, Rfl: 3 .  vitamin B-12 (CYANOCOBALAMIN) 1000 MCG tablet, Take 1,000 mcg by mouth daily., Disp: , Rfl:  .  Zinc 50 MG CAPS, Take 1 capsule by mouth daily., Disp: , Rfl:  .  ALPRAZolam (XANAX) 0.5 MG tablet, Take 1 tablet (0.5 mg total) by mouth at bedtime., Disp: 90 tablet, Rfl: 1  EXAM:  VITALS per patient if applicable:  GENERAL: alert, oriented, appears well and in no acute distress  HEENT: atraumatic, conjunttiva clear, no obvious abnormalities on inspection of external nose and ears  NECK: normal movements of the head and neck  LUNGS: on inspection no signs of respiratory distress, breathing rate appears normal, no obvious gross SOB, gasping or wheezing  CV: no obvious cyanosis  MS: moves all visible extremities without noticeable abnormality  PSYCH/NEURO: pleasant and cooperative, no obvious depression or anxiety, speech and thought processing grossly intact  ASSESSMENT AND PLAN: For the hypothyroidism, she will use up the last 2 months of Synthroid she has at home. Then we will switch to generic Levothyroxine. After taking this for 90 days we will meet again to discuss how she feels and to check blood levels. As for the Xanax, we will  give her the 0.5 mg dose at bedtime.  Gershon Crane, MD   Discussed the following assessment and plan:  No diagnosis found.     I discussed the assessment and treatment plan with the patient. The patient was provided an opportunity to ask questions and all were answered. The patient agreed with the plan and demonstrated an understanding of the instructions.   The patient was advised to call back or seek an in-person evaluation if the symptoms worsen or if the condition fails to  improve as anticipated.     Review of Systems     Objective:   Physical Exam        Assessment & Plan:

## 2018-07-06 NOTE — Telephone Encounter (Signed)
Dr. Fry please advise. Thanks  

## 2018-07-06 NOTE — Telephone Encounter (Signed)
Set up a virtual visit to discuss these things

## 2018-07-28 ENCOUNTER — Other Ambulatory Visit: Payer: Self-pay | Admitting: Family Medicine

## 2018-08-09 ENCOUNTER — Encounter: Payer: PPO | Admitting: Family Medicine

## 2018-08-26 ENCOUNTER — Ambulatory Visit: Payer: PPO | Admitting: Neurology

## 2018-10-11 ENCOUNTER — Telehealth: Payer: Self-pay | Admitting: Family Medicine

## 2018-10-11 NOTE — Telephone Encounter (Signed)
Please advise. Would you like for the patient to have a doxy follow up?

## 2018-10-11 NOTE — Telephone Encounter (Signed)
Patient called wanting to know if she should reschedule her CPE since she is high risk for COVID. She also needs a yearly refill on her prescriptions. She hasn't been out of the house since this whole pandemic started and her husband quit his job so he wouldn't bring anything home to her. She really doesn't want to come into the office at this time. She would like your advise and she will do what you feel is in her best interest.

## 2018-10-14 NOTE — Telephone Encounter (Signed)
Physical has been cancelled. Patient will call back to reschedule. Doxy has been scheduled for med refills.

## 2018-10-14 NOTE — Telephone Encounter (Signed)
Please set up a Doxy visit

## 2018-10-14 NOTE — Telephone Encounter (Signed)
Pt waiting on call back from previous message. Please call pt.

## 2018-10-14 NOTE — Telephone Encounter (Signed)
Message has been sent to Dr. Sarajane Jews. Will call patient back once I have his recommendation.

## 2018-10-24 ENCOUNTER — Ambulatory Visit: Payer: PPO | Admitting: Family Medicine

## 2018-10-26 ENCOUNTER — Telehealth (INDEPENDENT_AMBULATORY_CARE_PROVIDER_SITE_OTHER): Payer: PPO | Admitting: Family Medicine

## 2018-10-26 ENCOUNTER — Other Ambulatory Visit: Payer: Self-pay

## 2018-10-26 ENCOUNTER — Encounter: Payer: Self-pay | Admitting: Family Medicine

## 2018-10-26 DIAGNOSIS — I10 Essential (primary) hypertension: Secondary | ICD-10-CM

## 2018-10-26 DIAGNOSIS — R42 Dizziness and giddiness: Secondary | ICD-10-CM

## 2018-10-26 DIAGNOSIS — E538 Deficiency of other specified B group vitamins: Secondary | ICD-10-CM | POA: Diagnosis not present

## 2018-10-26 DIAGNOSIS — J4520 Mild intermittent asthma, uncomplicated: Secondary | ICD-10-CM

## 2018-10-26 DIAGNOSIS — K219 Gastro-esophageal reflux disease without esophagitis: Secondary | ICD-10-CM

## 2018-10-26 NOTE — Progress Notes (Signed)
Virtual Visit via Video Note  I connected with the patient on 10/26/18 at  3:00 PM EDT by a video enabled telemedicine application and verified that I am speaking with the correct person using two identifiers.  Location patient: home Location provider:work or home office Persons participating in the virtual visit: patient, provider  I discussed the limitations of evaluation and management by telemedicine and the availability of in person appointments. The patient expressed understanding and agreed to proceed.   HPI: Here to follow up on issues. She feels well in general except for slight dizziness at times. She is very pleased with her vitamin B12 dosing, and she feels so much better physically and emotionally. Her BP is stable. Her GERD is stable and she has been off Prilosec for some time.    ROS: See pertinent positives and negatives per HPI.  Past Medical History:  Diagnosis Date  . Asthma, mild intermittent   . GERD (gastroesophageal reflux disease)    watches diet  . History of colon polyps   . History of diabetes mellitus    no issue since gastric sleeve surgery and lost wt.  . History of exercise stress test    ETT--  06-17-2007--  NORMAL  . History of hypertension    no issue since gastric sleeve surgery and lost wt.  . Hyperlipidemia   . Hypothyroidism, postsurgical   . OSA (obstructive sleep apnea)    MODERATE OSA PER STUDY 09-25-2009--- no longer uses cpap,  no issue since gastric sleeve surgery and lost wt.  . Osteoporosis    last DEXA on 11-20-10 was normal   . PONV (postoperative nausea and vomiting)   . Right knee meniscal tear   . Varicose veins of both lower extremities with pain   . Vitamin D deficiency   . Wears glasses     Past Surgical History:  Procedure Laterality Date  . COLONOSCOPY  12-07-11   per Dr. Harrell Lark at [facility] clear (hx of adenomatous polyps), repeat in 5 yrs   . KNEE ARTHROSCOPY Right 08/10/2014   Procedure: ARTHROSCOPY RIGHT KNEE  WITH DEBRIDEMENT;  Surgeon: Paralee Cancel, MD;  Location: [facility];  Service: Orthopedics;  Laterality: Right;  . LAPAROSCOPIC CHOLECYSTECTOMY  Nov 2012  [place]  . LAPAROSCOPIC GASTRIC SLEEVE RESECTION  11-15-2009   [place]  . THYROID LOBECTOMY Left 1991   benign  . TONSILLECTOMY AND ADENOIDECTOMY  age 62  . VAGINAL HYSTERECTOMY  1995   w/ ovaries intact    Family History  Problem Relation Age of Onset  . Hypothyroidism Mother   . Heart disease Mother   . Hypertension Mother   . Alcohol abuse Father   . Heart disease Maternal Grandmother   . Alcohol abuse Paternal Grandfather   . Alcohol abuse Brother   . Heart disease Brother   . Heart attack Brother 35  . Alcohol abuse Brother   . Osteoporosis Brother   . Other Brother        varicose veins  . Osteoporosis Brother   . Arthritis Other        family hx of  . Coronary artery disease Other        family history of  . Hyperlipidemia Other        family history of  . Hypertension Other        family history of     Current Outpatient Medications:  .  ALPRAZolam (XANAX) 0.5 MG tablet, Take 1 tablet (0.5  mg total) by mouth at bedtime., Disp: 90 tablet, Rfl: 1 .  amLODipine (NORVASC) 10 MG tablet, Take 1 tablet (10 mg total) by mouth daily., Disp: 90 tablet, Rfl: 3 .  BIOTIN PO, Take 1 tablet by mouth daily., Disp: , Rfl:  .  Calcium-Magnesium-Vitamin D 600-40-500 MG-MG-UNIT TB24, Take by mouth daily., Disp: , Rfl:  .  Cholecalciferol (VITAMIN D3) 1000 UNITS CAPS, Take 1 capsule by mouth daily., Disp: , Rfl:  .  Coenzyme Q10 (COQ10) 100 MG CAPS, Take by mouth once., Disp: , Rfl:  .  fish oil-omega-3 fatty acids 1000 MG capsule, Take 2 g by mouth daily., Disp: , Rfl:  .  Glucosamine-Chondroit-Vit C-Mn (GLUCOSAMINE 1500 COMPLEX PO), Take 2 tablets by mouth daily., Disp: , Rfl:  .  ketoconazole (NIZORAL) 2 % cream, Apply 1 application topically 2 (two) times daily., Disp: 15 g, Rfl: 0 .  liothyronine  (CYTOMEL) 5 MCG tablet, TAKE 1 TABLET BY MOUTH EVERY DAY, Disp: 90 tablet, Rfl: 3 .  Multiple Vitamin (MULTIVITAMIN) tablet, Take 1 tablet by mouth daily., Disp: , Rfl:  .  omeprazole (PRILOSEC) 20 MG capsule, Take 1 capsule (20 mg total) by mouth daily., Disp: 30 capsule, Rfl: 3 .  Probiotic Product (ALIGN) 4 MG CAPS, Take 1 capsule by mouth daily., Disp: , Rfl:  .  raloxifene (EVISTA) 60 MG tablet, TAKE 1 TABLET BY MOUTH EVERY DAY, Disp: 90 tablet, Rfl: 3 .  sertraline (ZOLOFT) 25 MG tablet, Take 1 tablet (25 mg total) by mouth daily., Disp: 30 tablet, Rfl: 3 .  SYNTHROID 125 MCG tablet, TAKE 1 TABLET BY MOUTH EVERY DAY, Disp: 90 tablet, Rfl: 3 .  vitamin B-12 (CYANOCOBALAMIN) 1000 MCG tablet, Take 1,000 mcg by mouth daily., Disp: , Rfl:  .  Zinc 50 MG CAPS, Take 1 capsule by mouth daily., Disp: , Rfl:   EXAM:  VITALS per patient if applicable:  GENERAL: alert, oriented, appears well and in no acute distress  HEENT: atraumatic, conjunttiva clear, no obvious abnormalities on inspection of external nose and ears  NECK: normal movements of the head and neck  LUNGS: on inspection no signs of respiratory distress, breathing rate appears normal, no obvious gross SOB, gasping or wheezing  CV: no obvious cyanosis  MS: moves all visible extremities without noticeable abnormality  PSYCH/NEURO: pleasant and cooperative, no obvious depression or anxiety, speech and thought processing grossly intact  ASSESSMENT AND PLAN: She is doing well as far as asthma, GERD, dizziness, and HTN. Recheck in 6 months.  Gershon CraneStephen Altair Appenzeller, MD  Discussed the following assessment and plan:  No diagnosis found.     I discussed the assessment and treatment plan with the patient. The patient was provided an opportunity to ask questions and all were answered. The patient agreed with the plan and demonstrated an understanding of the instructions.   The patient was advised to call back or seek an in-person evaluation  if the symptoms worsen or if the condition fails to improve as anticipated.

## 2018-11-09 ENCOUNTER — Encounter: Payer: PPO | Admitting: Family Medicine

## 2018-11-22 ENCOUNTER — Encounter: Payer: Self-pay | Admitting: Family Medicine

## 2018-11-24 ENCOUNTER — Encounter: Payer: Self-pay | Admitting: Family Medicine

## 2018-11-24 ENCOUNTER — Other Ambulatory Visit: Payer: Self-pay

## 2018-11-24 ENCOUNTER — Telehealth (INDEPENDENT_AMBULATORY_CARE_PROVIDER_SITE_OTHER): Payer: PPO | Admitting: Family Medicine

## 2018-11-24 DIAGNOSIS — L739 Follicular disorder, unspecified: Secondary | ICD-10-CM

## 2018-11-24 MED ORDER — DOXYCYCLINE HYCLATE 100 MG PO CAPS: 100.0000 mg | ORAL_CAPSULE | Freq: Two times a day (BID) | ORAL | 0 refills | Status: AC

## 2018-11-24 NOTE — Progress Notes (Signed)
Virtual Visit via Telephone Note  I connected with the patient on 11/24/18 at 10:45 AM EDT by telephone and verified that I am speaking with the correct person using two identifiers. We attempted to connect virtually but we had technical difficulties with the audio and video.     I discussed the limitations, risks, security and privacy concerns of performing an evaluation and management service by telephone and the availability of in person appointments. I also discussed with the patient that there may be a patient responsible charge related to this service. The patient expressed understanding and agreed to proceed.  Location patient: home Location provider: work or home office Participants present for the call: patient, provider Patient did not have a visit in the prior 7 days to address this/these issue(s).   History of Present Illness: Here for an itchy rash that appeared in both groin areas 4 weeks ago and which has spread around to both lateral hip areas. No perineal involvement. This consists of clusters of red solid bumps. She has tried hydrocortisone cream, Lotrimen, Calamine, and zinc oxide with no relief. She feels fine otherwise.    Observations/Objective: Patient sounds cheerful and well on the phone. I do not appreciate any SOB. Speech and thought processing are grossly intact. Patient reported vitals:  Assessment and Plan: This sounds like a folliculitis. Treat with 10 days of Doxycycline. If this does not help I asked her to set up an in person OV so we can evaluate further.  Alysia Penna, MD   Follow Up Instructions:     [phone] 5-10 [phone] 11-20 [id number] 21-30 I did not refer this patient for an OV in the next 24 hours for this/these issue(s).  I discussed the assessment and treatment plan with the patient. The patient was provided an opportunity to ask questions and all were answered. The patient agreed with the plan and demonstrated an understanding of the  instructions.   The patient was advised to call back or seek an in-person evaluation if the symptoms worsen or if the condition fails to improve as anticipated.  I provided 13 minutes of non-face-to-face time during this encounter.   Alysia Penna, MD

## 2018-11-24 NOTE — Telephone Encounter (Signed)
She was seen in a virtual OV today.

## 2018-11-28 ENCOUNTER — Other Ambulatory Visit: Payer: Self-pay | Admitting: Family Medicine

## 2018-11-29 NOTE — Telephone Encounter (Signed)
Please advise. This is not on the patients med list. 

## 2018-12-26 ENCOUNTER — Other Ambulatory Visit: Payer: Self-pay | Admitting: Family Medicine

## 2018-12-26 NOTE — Telephone Encounter (Signed)
Ok for refill? 

## 2018-12-28 NOTE — Telephone Encounter (Signed)
Okay to refill? 

## 2019-01-06 ENCOUNTER — Other Ambulatory Visit: Payer: Self-pay | Admitting: Family Medicine

## 2019-01-06 MED ORDER — AMLODIPINE BESYLATE 10 MG PO TABS: 10.0000 mg | ORAL_TABLET | Freq: Every day | ORAL | 0 refills | Status: DC

## 2019-01-06 NOTE — Telephone Encounter (Signed)
Copied from [place] [id number]. Topic: Quick Communication - Rx Refill/Question >> Jan 06, 2019 10:36 AM Rainey Pines A wrote: Medication: amLODipine (NORVASC) 10 MG tablet (Patient stated that pharmacy has tried reaching out multiple times to get medication sent over.)  Has the patient contacted their pharmacy? Yes (Agent: If no, request that the patient contact the pharmacy for the refill.) (Agent: If yes, when and what did the pharmacy advise?)Contact PCP  Preferred Pharmacy (with phone number or street name): [facility], [address] [phone] (Phone) [phone] (Fax)    Agent: Please be advised that RX refills may take up to 3 business days. We ask that you follow-up with your pharmacy.

## 2019-03-28 ENCOUNTER — Other Ambulatory Visit: Payer: Self-pay | Admitting: Family Medicine

## 2019-04-25 ENCOUNTER — Encounter: Payer: Self-pay | Admitting: Family Medicine

## 2019-04-25 NOTE — Telephone Encounter (Signed)
(  1) she should get the vaccine as scheduled, (2) have her schedule an in person well exam sometime this month and come in fasting for labs

## 2019-05-18 ENCOUNTER — Other Ambulatory Visit: Payer: Self-pay

## 2019-05-18 ENCOUNTER — Telehealth (INDEPENDENT_AMBULATORY_CARE_PROVIDER_SITE_OTHER): Payer: PPO | Admitting: Family Medicine

## 2019-05-18 DIAGNOSIS — R05 Cough: Secondary | ICD-10-CM

## 2019-05-18 DIAGNOSIS — J209 Acute bronchitis, unspecified: Secondary | ICD-10-CM

## 2019-05-18 DIAGNOSIS — K219 Gastro-esophageal reflux disease without esophagitis: Secondary | ICD-10-CM

## 2019-05-18 DIAGNOSIS — R053 Chronic cough: Secondary | ICD-10-CM

## 2019-05-18 MED ORDER — ALBUTEROL SULFATE HFA 108 (90 BASE) MCG/ACT IN AERS: 2.0000 | INHALATION_SPRAY | RESPIRATORY_TRACT | 5 refills | Status: DC | PRN

## 2019-05-18 MED ORDER — OMEPRAZOLE 40 MG PO CPDR: 40.0000 mg | DELAYED_RELEASE_CAPSULE | Freq: Two times a day (BID) | ORAL | 3 refills | Status: DC

## 2019-05-18 MED ORDER — FLUCONAZOLE 150 MG PO TABS: ORAL_TABLET | ORAL | 5 refills | Status: DC

## 2019-05-18 MED ORDER — DOXYCYCLINE HYCLATE 100 MG PO CAPS: 100.0000 mg | ORAL_CAPSULE | Freq: Two times a day (BID) | ORAL | 0 refills | Status: AC

## 2019-05-18 NOTE — Progress Notes (Signed)
Virtual Visit via Video Note  I connected with the patient on 05/18/19 at 10:45 AM EST by a video enabled telemedicine application and verified that I am speaking with the correct person using two identifiers.  Location patient: home Location provider:work or home office Persons participating in the virtual visit: patient, provider  I discussed the limitations of evaluation and management by telemedicine and the availability of in person appointments. The patient expressed understanding and agreed to proceed.   HPI: Here to follow up on coughing spells. She has had a frequent cough for many years and she had an allergy workup years ago per Dr. Caprice Red, but nothing specific was found. Now in the past few months, this has gotten worse. She coughs more frequently, and sometimes she has such hard violent coughing spells that she passes out. This has happened twice, once on 04-22-19 and again last night. This last episode occurred at 10:30 pm when she was sitting in a recliner watching TV. She suddenly developed a hard coughing fit and her husband says she slumped back in her chair, her eyes rolled back in her head, and she became unresponsive for about 30-60 seconds. There was no tensing or shaking of her body, no loss of bowel or bladder control. When she came back around within a few seconds she felt fine and was quite alert. Today she thinks she has developed a chest infection because she feels congested in the chest and she is coughing up green sputum. No chest pain or fever. No body aches or NVD. No loss of taste or smell. She has never smoked and she does not take an ACE inhibitor.    ROS: See pertinent positives and negatives per HPI.  Past Medical History:  Diagnosis Date  . Asthma, mild intermittent   . GERD (gastroesophageal reflux disease)    watches diet  . History of colon polyps   . History of diabetes mellitus    no issue since gastric sleeve surgery and lost wt.  . History  of exercise stress test    ETT--  06-17-2007--  NORMAL  . History of hypertension    no issue since gastric sleeve surgery and lost wt.  . Hyperlipidemia   . Hypothyroidism, postsurgical   . OSA (obstructive sleep apnea)    MODERATE OSA PER STUDY 09-25-2009--- no longer uses cpap,  no issue since gastric sleeve surgery and lost wt.  . Osteoporosis    last DEXA on 11-20-10 was normal   . PONV (postoperative nausea and vomiting)   . Right knee meniscal tear   . Varicose veins of both lower extremities with pain   . Vitamin D deficiency   . Wears glasses     Past Surgical History:  Procedure Laterality Date  . COLONOSCOPY  12-07-11   per Dr. Harrell Lark at [facility], clear (hx of adenomatous polyps), repeat in 5 yrs   . KNEE ARTHROSCOPY Right 08/10/2014   Procedure: ARTHROSCOPY RIGHT KNEE WITH DEBRIDEMENT;  Surgeon: Paralee Cancel, MD;  Location: [facility];  Service: Orthopedics;  Laterality: Right;  . LAPAROSCOPIC CHOLECYSTECTOMY  Nov 2012  [place]  . LAPAROSCOPIC GASTRIC SLEEVE RESECTION  11-15-2009   [place]  . THYROID LOBECTOMY Left 1991   benign  . TONSILLECTOMY AND ADENOIDECTOMY  age 74  . VAGINAL HYSTERECTOMY  1995   w/ ovaries intact    Family History  Problem Relation Age of Onset  . Hypothyroidism Mother   . Heart disease Mother   .  Hypertension Mother   . Alcohol abuse Father   . Heart disease Maternal Grandmother   . Alcohol abuse Paternal Grandfather   . Alcohol abuse Brother   . Heart disease Brother   . Heart attack Brother 42  . Alcohol abuse Brother   . Osteoporosis Brother   . Other Brother        varicose veins  . Osteoporosis Brother   . Arthritis Other        family hx of  . Coronary artery disease Other        family history of  . Hyperlipidemia Other        family history of  . Hypertension Other        family history of     Current Outpatient Medications:  .  albuterol (VENTOLIN HFA) 108 (90 Base) MCG/ACT inhaler, Inhale  2 puffs into the lungs every 4 (four) hours as needed for wheezing or shortness of breath., Disp: 18 g, Rfl: 5 .  ALPRAZolam (XANAX) 0.5 MG tablet, TAKE 1 TABLET (0.5 MG TOTAL) BY MOUTH AT BEDTIME., Disp: 90 tablet, Rfl: 1 .  amLODipine (NORVASC) 10 MG tablet, TAKE 1 TABLET BY MOUTH EVERY DAY, Disp: 90 tablet, Rfl: 0 .  BIOTIN PO, Take 1 tablet by mouth daily., Disp: , Rfl:  .  Calcium-Magnesium-Vitamin D 600-40-500 MG-MG-UNIT TB24, Take by mouth daily., Disp: , Rfl:  .  Cholecalciferol (VITAMIN D3) 1000 UNITS CAPS, Take 1 capsule by mouth daily., Disp: , Rfl:  .  Coenzyme Q10 (COQ10) 100 MG CAPS, Take by mouth once., Disp: , Rfl:  .  doxycycline (VIBRAMYCIN) 100 MG capsule, Take 1 capsule (100 mg total) by mouth 2 (two) times daily for 10 days., Disp: 20 capsule, Rfl: 0 .  fish oil-omega-3 fatty acids 1000 MG capsule, Take 2 g by mouth daily., Disp: , Rfl:  .  fluconazole (DIFLUCAN) 150 MG tablet, TAKE 1 TABLET BY MOUTH AS ONE DOSE, Disp: 2 tablet, Rfl: 5 .  Glucosamine-Chondroit-Vit C-Mn (GLUCOSAMINE 1500 COMPLEX PO), Take 2 tablets by mouth daily., Disp: , Rfl:  .  ketoconazole (NIZORAL) 2 % cream, Apply 1 application topically 2 (two) times daily., Disp: 15 g, Rfl: 0 .  liothyronine (CYTOMEL) 5 MCG tablet, TAKE 1 TABLET BY MOUTH EVERY DAY, Disp: 90 tablet, Rfl: 3 .  Multiple Vitamin (MULTIVITAMIN) tablet, Take 1 tablet by mouth daily., Disp: , Rfl:  .  omeprazole (PRILOSEC) 40 MG capsule, Take 1 capsule (40 mg total) by mouth 2 (two) times daily., Disp: 60 capsule, Rfl: 3 .  Probiotic Product (ALIGN) 4 MG CAPS, Take 1 capsule by mouth daily., Disp: , Rfl:  .  raloxifene (EVISTA) 60 MG tablet, TAKE 1 TABLET BY MOUTH EVERY DAY, Disp: 90 tablet, Rfl: 3 .  SYNTHROID 125 MCG tablet, TAKE 1 TABLET BY MOUTH EVERY DAY, Disp: 90 tablet, Rfl: 3 .  vitamin B-12 (CYANOCOBALAMIN) 1000 MCG tablet, Take 1,000 mcg by mouth daily., Disp: , Rfl:  .  Zinc 50 MG CAPS, Take 1 capsule by mouth daily., Disp: , Rfl:    EXAM:  VITALS per patient if applicable:  GENERAL: alert, oriented, appears well and in no acute distress  HEENT: atraumatic, conjunttiva clear, no obvious abnormalities on inspection of external nose and ears  NECK: normal movements of the head and neck  LUNGS: on inspection no signs of respiratory distress, breathing rate appears normal, no obvious gross SOB, gasping or wheezing  CV: no obvious cyanosis  MS: moves all visible  extremities without noticeable abnormality  PSYCH/NEURO: pleasant and cooperative, no obvious depression or anxiety, speech and thought processing grossly intact  ASSESSMENT AND PLAN: She has a chronic cough which could be the result of GERD. We will start her on a trial of Omeprazole 40 mg BID. She can also use an albuterol inhaler prn. We will refer her to Pulmonary for the chronic cough. In addition she seems to have an acute bronchitis, so we will treat this with Doxycycline. She will see on 05-26-19 for a well exam, and we will follow these issues up at that time.  Gershon Crane, MD  Discussed the following assessment and plan:  Chronic cough - Plan: Ambulatory referral to Pulmonology     I discussed the assessment and treatment plan with the patient. The patient was provided an opportunity to ask questions and all were answered. The patient agreed with the plan and demonstrated an understanding of the instructions.   The patient was advised to call back or seek an in-person evaluation if the symptoms worsen or if the condition fails to improve as anticipated.

## 2019-05-19 ENCOUNTER — Telehealth: Payer: PPO | Admitting: Family Medicine

## 2019-05-20 ENCOUNTER — Other Ambulatory Visit: Payer: Self-pay | Admitting: Family Medicine

## 2019-05-25 ENCOUNTER — Other Ambulatory Visit: Payer: Self-pay

## 2019-05-26 ENCOUNTER — Encounter: Payer: Self-pay | Admitting: Family Medicine

## 2019-05-26 ENCOUNTER — Ambulatory Visit (INDEPENDENT_AMBULATORY_CARE_PROVIDER_SITE_OTHER): Payer: PPO | Admitting: Family Medicine

## 2019-05-26 VITALS — BP 160/70 | HR 88 | Temp 98.4°F | Ht 65.0 in | Wt 255.4 lb

## 2019-05-26 DIAGNOSIS — E538 Deficiency of other specified B group vitamins: Secondary | ICD-10-CM

## 2019-05-26 DIAGNOSIS — Z0000 Encounter for general adult medical examination without abnormal findings: Secondary | ICD-10-CM

## 2019-05-26 DIAGNOSIS — E890 Postprocedural hypothyroidism: Secondary | ICD-10-CM | POA: Diagnosis not present

## 2019-05-26 DIAGNOSIS — M810 Age-related osteoporosis without current pathological fracture: Secondary | ICD-10-CM | POA: Diagnosis not present

## 2019-05-26 LAB — BASIC METABOLIC PANEL
BUN: 13 mg/dL (ref 6–23)
CO2: 30 mEq/L (ref 19–32)
Calcium: 9.5 mg/dL (ref 8.4–10.5)
Chloride: 100 mEq/L (ref 96–112)
Creatinine, Ser: 0.65 mg/dL (ref 0.40–1.20)
GFR: 91.09 mL/min (ref >60.00)
Glucose, Bld: 106 mg/dL — ABNORMAL HIGH (ref 70–99)
Potassium: 4 mEq/L (ref 3.5–5.1)
Sodium: 139 mEq/L (ref 135–145)

## 2019-05-26 LAB — VITAMIN B12: Vitamin B-12: 879 pg/mL (ref 211–911)

## 2019-05-26 LAB — CBC WITH DIFFERENTIAL/PLATELET
Basophils Absolute: 0.1 10*3/uL (ref 0.0–0.1)
Basophils Relative: 0.7 % (ref 0.0–3.0)
Eosinophils Absolute: 0.4 10*3/uL (ref 0.0–0.7)
Eosinophils Relative: 5.1 % — ABNORMAL HIGH (ref 0.0–5.0)
HCT: 43.6 % (ref 36.0–46.0)
Hemoglobin: 14.9 g/dL (ref 12.0–15.0)
Lymphocytes Relative: 32 % (ref 12.0–46.0)
Lymphs Abs: 2.4 10*3/uL (ref 0.7–4.0)
MCHC: 34.1 g/dL (ref 30.0–36.0)
MCV: 104.3 fl — ABNORMAL HIGH (ref 78.0–100.0)
Monocytes Absolute: 0.5 10*3/uL (ref 0.1–1.0)
Monocytes Relative: 7 % (ref 3.0–12.0)
Neutro Abs: 4.1 10*3/uL (ref 1.4–7.7)
Neutrophils Relative %: 55.2 % (ref 43.0–77.0)
Platelets: 287 10*3/uL (ref 150.0–400.0)
RBC: 4.18 Mil/uL (ref 3.87–5.11)
RDW: 12.1 % (ref 11.5–15.5)
WBC: 7.5 10*3/uL (ref 4.0–10.5)

## 2019-05-26 LAB — LIPID PANEL
Cholesterol: 178 mg/dL (ref 0–200)
HDL: 61.6 mg/dL (ref >39.00)
LDL Cholesterol: 85 mg/dL (ref 0–99)
NonHDL: 116.82
Total CHOL/HDL Ratio: 3
Triglycerides: 159 mg/dL — ABNORMAL HIGH (ref 0.0–149.0)
VLDL: 31.8 mg/dL (ref 0.0–40.0)

## 2019-05-26 LAB — T3, FREE: T3, Free: 3.3 pg/mL (ref 2.3–4.2)

## 2019-05-26 LAB — HEPATIC FUNCTION PANEL
ALT: 23 U/L (ref 0–35)
AST: 21 U/L (ref 0–37)
Albumin: 4.2 g/dL (ref 3.5–5.2)
Alkaline Phosphatase: 91 U/L (ref 39–117)
Bilirubin, Direct: 0.1 mg/dL (ref 0.0–0.3)
Total Bilirubin: 0.8 mg/dL (ref 0.2–1.2)
Total Protein: 7.1 g/dL (ref 6.0–8.3)

## 2019-05-26 LAB — TSH: TSH: 1.54 u[IU]/mL (ref 0.35–4.50)

## 2019-05-26 LAB — T4, FREE: Free T4: 0.98 ng/dL (ref 0.60–1.60)

## 2019-05-26 MED ORDER — KETOCONAZOLE 2 % EX CREA: 1.0000 "application " | TOPICAL_CREAM | Freq: Two times a day (BID) | CUTANEOUS | 2 refills | Status: AC

## 2019-05-26 MED ORDER — ALPRAZOLAM 1 MG PO TABS: 1.0000 mg | ORAL_TABLET | Freq: Two times a day (BID) | ORAL | 5 refills | Status: DC | PRN

## 2019-05-26 MED ORDER — AMLODIPINE BESYLATE 10 MG PO TABS: 10.0000 mg | ORAL_TABLET | Freq: Every day | ORAL | 3 refills | Status: DC

## 2019-05-26 NOTE — Progress Notes (Signed)
Subjective:    Patient ID: Lori Durham, female    DOB: 06-Mar-1951, 69 y.o.   MRN: [id number]  HPI Here for a well exam. She has a number of issues to discuss. First we had a virtual meeting with her a week ago for a chronic cough that has gotten worse. She took a course of Doxycycline which seemed to help. We felt the cough was related to GERD, so she started taking Omeprazole 40 mg BID. We also referred her to Pulmonary. She complains about her inability to lose weight, and she knows that lack of exercise is a part of the problem. Her joint pains prohibit her from walking or doing most any other type of exercise. She also complains of an intermittent pain in the left lateral hip that started about 2 months ago. No recent trauma. She is past due for a colonoscopy and a mammogram. Her anxiety has gotten a little worse this past year and she has trouble sleeping. She has taken a 0.5 mg Xanax at bedtime for years, but it no longer seems to help.    Review of Systems  Constitutional: Negative.   HENT: Negative.   Eyes: Negative.   Respiratory: Positive for cough.   Cardiovascular: Negative.   Gastrointestinal: Negative.   Genitourinary: Negative for decreased urine volume, difficulty urinating, dyspareunia, dysuria, enuresis, flank pain, frequency, hematuria, pelvic pain and urgency.  Musculoskeletal: Positive for arthralgias.  Skin: Negative.   Neurological: Negative.   Psychiatric/Behavioral: Positive for sleep disturbance. Negative for agitation, behavioral problems, confusion, dysphoric mood and hallucinations. The patient is nervous/anxious.        Objective:   Physical Exam Constitutional:      General: She is not in acute distress.    Appearance: She is well-developed.     Comments: Morbidly obese   HENT:     Head: Normocephalic and atraumatic.     Right Ear: External ear normal.     Left Ear: External ear normal.     Nose: Nose normal.     Mouth/Throat:     Pharynx: No  oropharyngeal exudate.  Eyes:     General: No scleral icterus.    Conjunctiva/sclera: Conjunctivae normal.     Pupils: Pupils are equal, round, and reactive to light.  Neck:     Thyroid: No thyromegaly.     Vascular: No JVD.  Cardiovascular:     Rate and Rhythm: Normal rate and regular rhythm.     Heart sounds: Normal heart sounds. No murmur. No friction rub. No gallop.   Pulmonary:     Effort: Pulmonary effort is normal. No respiratory distress.     Breath sounds: Normal breath sounds. No wheezing or rales.  Chest:     Chest wall: No tenderness.  Abdominal:     General: Bowel sounds are normal. There is no distension.     Palpations: Abdomen is soft. There is no mass.     Tenderness: There is no abdominal tenderness. There is no guarding or rebound.  Musculoskeletal:        General: Normal range of motion.     Cervical back: Normal range of motion and neck supple.     Comments: She is very tender over the left lateral hip. ROM is full   Lymphadenopathy:     Cervical: No cervical adenopathy.  Skin:    General: Skin is warm and dry.     Findings: No erythema or rash.  Neurological:     Mental  Status: She is alert and oriented to person, place, and time.     Cranial Nerves: No cranial nerve deficit.     Motor: No abnormal muscle tone.     Coordination: Coordination normal.     Deep Tendon Reflexes: Reflexes are normal and symmetric. Reflexes normal.  Psychiatric:        Behavior: Behavior normal.        Thought Content: Thought content normal.        Judgment: Judgment normal.           Assessment & Plan:  Well exam. We discussed diet and exercise. I suggested she join a [employer] with a pool so she could swim or walk the pool for exercise. She has left greater trochanteric bursitis, so she can use ice and Ibuprofen prn. If this does not respond she could see Orthopedics for a steroid injection. For her anxiety and insomnia, we will increase the Xanax to 1 mg BID. For the  cough we will proceed as above. Get fasting labs. She will set up a mammogram. We will refer her for another colonoscopy and a DEXA. Alysia Penna, MD

## 2019-06-05 ENCOUNTER — Ambulatory Visit (INDEPENDENT_AMBULATORY_CARE_PROVIDER_SITE_OTHER)
Admission: RE | Admit: 2019-06-05 | Discharge: 2019-06-05 | Disposition: A | Discharge status: Home / Self Care | Payer: PPO | Source: Ambulatory Visit | Attending: Family Medicine | Admitting: Family Medicine

## 2019-06-05 ENCOUNTER — Telehealth: Payer: Self-pay | Admitting: Gastroenterology

## 2019-06-05 ENCOUNTER — Telehealth: Payer: Self-pay | Admitting: Family Medicine

## 2019-06-05 ENCOUNTER — Other Ambulatory Visit: Payer: Self-pay

## 2019-06-05 DIAGNOSIS — M810 Age-related osteoporosis without current pathological fracture: Secondary | ICD-10-CM | POA: Diagnosis not present

## 2019-06-05 NOTE — Telephone Encounter (Addendum)
Hi Dr. Adela Lank,   We have received a referral from this patient's PCP for a repeat colon. Patient had colon in 2008 and 2013 records have been received and they will be sent to you for review.    Please advise on scheduling.  Thank you

## 2019-06-05 NOTE — Chronic Care Management (AMB) (Signed)
  Chronic Care Management   Note  06/05/2019 Name: Lori Durham MRN: [id number] DOB: November 20, 1950  Lori Durham is a 69 y.o. year old female who is a primary care patient of Nelwyn Salisbury, MD. I reached out to [name] by phone today in response to a referral sent by Lori Durham's PCP, Nelwyn Salisbury, MD.   Lori Durham was given information about Chronic Care Management services today including:  1. CCM service includes personalized support from designated clinical staff supervised by her physician, including individualized plan of care and coordination with other care providers 2. 24/7 contact phone numbers for assistance for urgent and routine care needs. 3. Service will only be billed when office clinical staff spend 20 minutes or more in a month to coordinate care. 4. Only one practitioner may furnish and bill the service in a calendar month. 5. The patient may stop CCM services at any time (effective at the end of the month) by phone call to the office staff.   Patient agreed to services and verbal consent obtained.   Follow up plan:   Lori Durham [employer] Scheduler

## 2019-06-05 NOTE — Progress Notes (Signed)
  Chronic Care Management   Outreach Note  06/05/2019 Name: JOENE GELDER MRN: [id number] DOB: May 28, 1950  Referred by: Nelwyn Salisbury, MD Reason for referral : No chief complaint on file.   An unsuccessful telephone outreach was attempted today. The patient was referred to the pharmacist for assistance with care management and care coordination.   Follow Up Plan:   Raynicia Dukes [employer] Scheduler

## 2019-06-12 ENCOUNTER — Encounter: Payer: Self-pay | Admitting: Gastroenterology

## 2019-06-20 ENCOUNTER — Telehealth: Payer: PPO

## 2019-07-05 DIAGNOSIS — H2513 Age-related nuclear cataract, bilateral: Secondary | ICD-10-CM | POA: Diagnosis not present

## 2019-07-06 ENCOUNTER — Other Ambulatory Visit: Payer: Self-pay

## 2019-07-06 ENCOUNTER — Ambulatory Visit: Payer: PPO | Admitting: Critical Care Medicine

## 2019-07-06 ENCOUNTER — Encounter: Payer: Self-pay | Admitting: Critical Care Medicine

## 2019-07-06 VITALS — BP 160/76 | HR 77 | Temp 98.1°F | Ht 64.76 in | Wt 262.2 lb

## 2019-07-06 DIAGNOSIS — J309 Allergic rhinitis, unspecified: Secondary | ICD-10-CM

## 2019-07-06 DIAGNOSIS — K219 Gastro-esophageal reflux disease without esophagitis: Secondary | ICD-10-CM | POA: Diagnosis not present

## 2019-07-06 DIAGNOSIS — R05 Cough: Secondary | ICD-10-CM | POA: Diagnosis not present

## 2019-07-06 DIAGNOSIS — R053 Chronic cough: Secondary | ICD-10-CM

## 2019-07-06 MED ORDER — PANTOPRAZOLE SODIUM 40 MG PO TBEC: 40.0000 mg | DELAYED_RELEASE_TABLET | Freq: Every day | ORAL | 3 refills | Status: DC

## 2019-07-06 NOTE — Patient Instructions (Addendum)
Thank you for visiting Dr. Chestine Spore at [facility]. We recommend the following: Try taking cetirizine or loratadine daily for several weeks for allergies.  Stop taking omeprazole for a few days until rash improves. Then start pantopazole once daily.   Meds ordered this encounter  Medications  . pantoprazole (PROTONIX) 40 MG tablet    Sig: Take 1 tablet (40 mg total) by mouth daily.    Dispense:  30 tablet    Refill:  3    Return in about 2 months (around 09/05/2019).    Please do your part to reduce the spread of COVID-19.

## 2019-07-06 NOTE — Progress Notes (Signed)
Synopsis: Referred in April 2021 for chronic cough by Lori Salisbury, MD.  Subjective:   PATIENT ID: Lori Durham GENDER: female DOB: Dec 19, 1950, MRN: [id number]  Chief Complaint  Patient presents with  . Consult    Patient states she has dry/productive cough that started first of the year. Sometimes has a coughing spell that makes her pass out. Patients also has vertigo.     Lori Durham is a 69 y/o woman who presents for evaluation of chronic cough.  For the past few months her cough has been worse, occurring more frequently.  She has had 2 episodes of cough syncope.  She coughs more when she first lays down.  Her cough resolved fully when she lost 120 pounds after bariatric surgery (sleeve gastrectomy).  As she slowly regained weight her cough returned.  She has a hiatal hernia.  She was treated for acute bronchitis with doxycycline with some improvement in her symptoms.  She was started on omeprazole in February with some improvement in her cough.  Albuterol has improved her cough, which she began in March.  She is using this 2 to 3 days/week.  Since starting omeprazole she has had a nonitching, red rash on her arms.  She does not think this is a limitation to her continuing this medication.  She has no previous history of lung disease or tobacco use.  No family history of lung disease.  She has a history of chronic allergies.  She was evaluated by [facility] many years ago and was told she is slightly allergic to cats, mold, dust, and other common things.  Her symptoms have been slightly worse recently, she denies postnasal drip.  She takes cetirizine periodically.  She lives in an older house.  She complains of chronic vertigo, but denies sensation of spinning.  She more feels unsteady and has balance issues.  She feels that this is worse when she is tired.  She is relatively immobile due to chronic knee pain.  She has trouble walking up the stairs.    Past Medical History:    Diagnosis Date  . Asthma, mild intermittent   . GERD (gastroesophageal reflux disease)    watches diet  . History of colon polyps   . History of diabetes mellitus    no issue since gastric sleeve surgery and lost wt.  . History of exercise stress test    ETT--  06-17-2007--  NORMAL  . History of hypertension    no issue since gastric sleeve surgery and lost wt.  . Hyperlipidemia   . Hypothyroidism, postsurgical   . OSA (obstructive sleep apnea)    MODERATE OSA PER STUDY 09-25-2009--- no longer uses cpap,  no issue since gastric sleeve surgery and lost wt.  . Osteoporosis    last DEXA on 11-20-10 was normal   . PONV (postoperative nausea and vomiting)   . Right knee meniscal tear   . Varicose veins of both lower extremities with pain   . Vitamin D deficiency   . Wears glasses      Family History  Problem Relation Age of Onset  . Hypothyroidism Mother   . Heart disease Mother   . Hypertension Mother   . Alcohol abuse Father   . Heart disease Maternal Grandmother   . Alcohol abuse Paternal Grandfather   . Alcohol abuse Brother   . Heart disease Brother   . Heart attack Brother 42  . Alcohol abuse Brother   . Osteoporosis Brother   .  Other Brother        varicose veins  . Osteoporosis Brother   . Arthritis Other        family hx of  . Coronary artery disease Other        family history of  . Hyperlipidemia Other        family history of  . Hypertension Other        family history of     Past Surgical History:  Procedure Laterality Date  . COLONOSCOPY  12-07-11   [facility] clear (hx of adenomatous polyps), repeat in 5 yrs   . KNEE ARTHROSCOPY Right 08/10/2014   Procedure: ARTHROSCOPY RIGHT KNEE WITH DEBRIDEMENT;  Surgeon: Lori RomansMatthew Olin, MD;  Location: [facility];  Service: Orthopedics;  Laterality: Right;  . LAPAROSCOPIC CHOLECYSTECTOMY  Nov 2012  [facility]  . LAPAROSCOPIC GASTRIC SLEEVE RESECTION  11-15-2009   [facility]  .  THYROID LOBECTOMY Left 1991   benign  . TONSILLECTOMY AND ADENOIDECTOMY  age 89  . VAGINAL HYSTERECTOMY  1995   w/ ovaries intact    Social History   Socioeconomic History  . Marital status: Married    Spouse name: Lori Durham  . Number of children: 1  . Years of education: Not on file  . Highest education level: Some college, no degree  Occupational History  . Occupation: Field seismologistAYROLL ADMINISTRATOR     Employer: [employer]    Comment: [employer]  Tobacco Use  . Smoking status: Never Smoker  . Smokeless tobacco: Never Used  Substance and Sexual Activity  . Alcohol use: Yes    Comment: occ  . Drug use: No  . Sexual activity: Not on file  Other Topics Concern  . Not on file  Social History Narrative   Patient is right-handed. She lives with her husband in a 2 level home. She drinks 1 cup of coffee a day. She does not exercise.. she is currently on STD with her job.   Social Determinants of Health   Financial Resource Strain:   . Difficulty of Paying Living Expenses:   Food Insecurity:   . Worried About Programme researcher, broadcasting/film/videounning Out of Food in the Last Year:   . Baristaan Out of Food in the Last Year:   Transportation Needs:   . Freight forwarderLack of Transportation (Medical):   Marland Kitchen. Lack of Transportation (Non-Medical):   Physical Activity:   . Days of Exercise per Week:   . Minutes of Exercise per Session:   Stress:   . Feeling of Stress :   Social Connections:   . Frequency of Communication with Friends and Family:   . Frequency of Social Gatherings with Friends and Family:   . Attends Religious Services:   . Active Member of Clubs or Organizations:   . Attends BankerClub or Organization Meetings:   Marland Kitchen. Marital Status:   Intimate Partner Violence:   . Fear of Current or Ex-Partner:   . Emotionally Abused:   Marland Kitchen. Physically Abused:   . Sexually Abused:      Allergies  Allergen Reactions  . Augmentin [Amoxicillin-Pot Clavulanate] Hives    Has patient had a PCN reaction causing immediate rash,  facial/tongue/throat swelling, SOB or lightheadedness with hypotension: Yes Has patient had a PCN reaction causing severe rash involving mucus membranes or skin necrosis: No Has patient had a PCN reaction that required hospitalization: No Has patient had a PCN reaction occurring within the last 10 years: Yes If  all of the above answers are "NO", then may proceed with Cephalosporin use.   . Contrast Media [Iodinated Diagnostic Agents] Hives    Caused skin to burn  . Doxycycline     headache  . Hydrocodone Other (See Comments)    ( cough syrup ) light headed     Immunization History  Administered Date(s) Administered  . Influenza Split 12/25/2011  . Influenza Whole 12/05/2009  . Influenza, Lori Dose Seasonal PF 01/11/2019  . Influenza,inj,Quad PF,6+ Mos 02/17/2013, 12/14/2013, 12/10/2017  . Influenza-Unspecified 12/21/2014, 04/24/2016, 01/14/2017  . PFIZER SARS-COV-2 Vaccination 04/29/2019, 05/20/2019  . Pneumococcal Polysaccharide-23 08/13/2017  . Zoster 02/23/2013  . Zoster Recombinat (Shingrix) 08/12/2017, 11/26/2017    Outpatient Medications Prior to Visit  Medication Sig Dispense Refill  . albuterol (VENTOLIN HFA) 108 (90 Base) MCG/ACT inhaler Inhale 2 puffs into the lungs every 4 (four) hours as needed for wheezing or shortness of breath. 18 g 5  . ALPRAZolam (XANAX) 1 MG tablet Take 1 tablet (1 mg total) by mouth 2 (two) times daily as needed for anxiety or sleep. 60 tablet 5  . amLODipine (NORVASC) 10 MG tablet Take 1 tablet (10 mg total) by mouth daily. 90 tablet 3  . BIOTIN PO Take 1 tablet by mouth daily.    . Calcium-Magnesium-Vitamin D 623-76-283 MG-MG-UNIT TB24 Take by mouth daily.    . Cholecalciferol (VITAMIN D3) 1000 UNITS CAPS Take 1 capsule by mouth daily.    . Coenzyme Q10 (COQ10) 100 MG CAPS Take by mouth once.    . fish oil-omega-3 fatty acids 1000 MG capsule Take 2 g by mouth daily.    . fluconazole (DIFLUCAN) 150 MG tablet TAKE 1 TABLET BY MOUTH AS ONE DOSE  2 tablet 5  . Glucosamine-Chondroit-Vit C-Mn (GLUCOSAMINE 1500 COMPLEX PO) Take 2 tablets by mouth daily.    Marland Kitchen ketoconazole (NIZORAL) 2 % cream Apply 1 application topically 2 (two) times daily. 60 g 2  . liothyronine (CYTOMEL) 5 MCG tablet TAKE 1 TABLET BY MOUTH EVERY DAY 90 tablet 3  . Multiple Vitamin (MULTIVITAMIN) tablet Take 1 tablet by mouth daily.    Marland Kitchen omeprazole (PRILOSEC) 40 MG capsule Take 1 capsule (40 mg total) by mouth 2 (two) times daily. 60 capsule 3  . Probiotic Product (ALIGN) 4 MG CAPS Take 1 capsule by mouth daily.    . raloxifene (EVISTA) 60 MG tablet TAKE 1 TABLET BY MOUTH EVERY DAY 90 tablet 3  . SYNTHROID 125 MCG tablet TAKE 1 TABLET BY MOUTH EVERY DAY 90 tablet 3  . vitamin B-12 (CYANOCOBALAMIN) 1000 MCG tablet Take 1,000 mcg by mouth daily.    . Zinc 50 MG CAPS Take 1 capsule by mouth daily.     No facility-administered medications prior to visit.    Review of Systems  Constitutional: Negative for chills, fever and weight loss.  HENT: Negative for congestion.   Respiratory: Positive for cough. Negative for wheezing.   Cardiovascular: Positive for leg swelling. Negative for chest pain.  Gastrointestinal: Positive for heartburn.  Musculoskeletal: Positive for joint pain.  Skin: Positive for rash. Negative for itching.  Neurological: Positive for dizziness.       Abnormal balance  Endo/Heme/Allergies: Positive for environmental allergies.  Psychiatric/Behavioral: The patient is nervous/anxious and has insomnia.      Objective:   Vitals:   07/06/19 1424  BP: (!) 160/76  Pulse: 77  Temp: 98.1 F (36.7 C)  TempSrc: Temporal  SpO2: 97%  Weight: 262 lb 3.2 oz (118.9 kg)  Height: 5' 4.76" (1.645 m)   97% on   RA BMI Readings from Last 3 Encounters:  07/06/19 43.95 kg/m  05/26/19 42.50 kg/m  04/22/18 41.93 kg/m   Wt Readings from Last 3 Encounters:  07/06/19 262 lb 3.2 oz (118.9 kg)  05/26/19 255 lb 6.4 oz (115.8 kg)  04/22/18 252 lb (114.3 kg)      Physical Exam Vitals reviewed.  Constitutional:      General: She is not in acute distress.    Appearance: She is obese. She is not ill-appearing.  HENT:     Head: Normocephalic and atraumatic.  Eyes:     General: No scleral icterus. Cardiovascular:     Rate and Rhythm: Normal rate and regular rhythm.     Heart sounds: No murmur.  Pulmonary:     Comments: Breathing comfortably on room air, no conversational dyspnea.  Clear to auscultation bilaterally.  Minimal coughing during encounter. Abdominal:     General: There is no distension.  Musculoskeletal:        General: Swelling present. No deformity.     Cervical back: Neck supple.  Lymphadenopathy:     Cervical: No cervical adenopathy.  Skin:    General: Skin is warm and dry.     Findings: No rash.  Neurological:     General: No focal deficit present.     Mental Status: She is alert.  Psychiatric:        Mood and Affect: Mood normal.        Behavior: Behavior normal.      CBC    Component Value Date/Time   WBC 7.5 05/26/2019 0950   RBC 4.18 05/26/2019 0950   HGB 14.9 05/26/2019 0950   HCT 43.6 05/26/2019 0950   PLT 287.0 05/26/2019 0950   MCV 104.3 (H) 05/26/2019 0950   MCH 34.8 (H) 01/26/2018 1236   MCHC 34.1 05/26/2019 0950   RDW 12.1 05/26/2019 0950   LYMPHSABS 2.4 05/26/2019 0950   MONOABS 0.5 05/26/2019 0950   EOSABS 0.4 05/26/2019 0950   BASOSABS 0.1 05/26/2019 0950    CHEMISTRY No results for input(s): NA, K, CL, CO2, GLUCOSE, BUN, CREATININE, CALCIUM, MG, PHOS in the last 168 hours. CrCl cannot be calculated (Patient's most recent lab result is older than the maximum 21 days allowed.).   Chest Imaging- films reviewed: CXR, 2 view 07/05/2017-faint right middle lobe opacity.  Increased lung markings bilaterally.  Pulmonary Functions Testing Results: No flowsheet data found.       Assessment & Plan:     ICD-10-CM   1. Chronic cough  R05   2. Allergic rhinitis, unspecified seasonality,  unspecified trigger  J30.9   3. Gastroesophageal reflux disease, unspecified whether esophagitis present  K21.9     Chronic cough with 2 episodes of cough syncope -Taking cetirizine or Claritin daily -Switch omeprazole to pantoprazole given rash -Discussed dietary modifications to GERD and the importance of weight loss and chronic management.  She wants to discuss bariatric surgery in the future. -Discussed that she should not drive unless her coughing has resolved given the danger to herself and others.  RTC in 2 months.    Current Outpatient Medications:  .  albuterol (VENTOLIN HFA) 108 (90 Base) MCG/ACT inhaler, Inhale 2 puffs into the lungs every 4 (four) hours as needed for wheezing or shortness of breath., Disp: 18 g, Rfl: 5 .  ALPRAZolam (XANAX) 1 MG tablet, Take 1 tablet (1 mg total) by mouth 2 (two) times daily as needed  for anxiety or sleep., Disp: 60 tablet, Rfl: 5 .  amLODipine (NORVASC) 10 MG tablet, Take 1 tablet (10 mg total) by mouth daily., Disp: 90 tablet, Rfl: 3 .  BIOTIN PO, Take 1 tablet by mouth daily., Disp: , Rfl:  .  Calcium-Magnesium-Vitamin D 600-40-500 MG-MG-UNIT TB24, Take by mouth daily., Disp: , Rfl:  .  Cholecalciferol (VITAMIN D3) 1000 UNITS CAPS, Take 1 capsule by mouth daily., Disp: , Rfl:  .  Coenzyme Q10 (COQ10) 100 MG CAPS, Take by mouth once., Disp: , Rfl:  .  fish oil-omega-3 fatty acids 1000 MG capsule, Take 2 g by mouth daily., Disp: , Rfl:  .  fluconazole (DIFLUCAN) 150 MG tablet, TAKE 1 TABLET BY MOUTH AS ONE DOSE, Disp: 2 tablet, Rfl: 5 .  Glucosamine-Chondroit-Vit C-Mn (GLUCOSAMINE 1500 COMPLEX PO), Take 2 tablets by mouth daily., Disp: , Rfl:  .  ketoconazole (NIZORAL) 2 % cream, Apply 1 application topically 2 (two) times daily., Disp: 60 g, Rfl: 2 .  liothyronine (CYTOMEL) 5 MCG tablet, TAKE 1 TABLET BY MOUTH EVERY DAY, Disp: 90 tablet, Rfl: 3 .  Multiple Vitamin (MULTIVITAMIN) tablet, Take 1 tablet by mouth daily., Disp: , Rfl:  .   omeprazole (PRILOSEC) 40 MG capsule, Take 1 capsule (40 mg total) by mouth 2 (two) times daily., Disp: 60 capsule, Rfl: 3 .  Probiotic Product (ALIGN) 4 MG CAPS, Take 1 capsule by mouth daily., Disp: , Rfl:  .  raloxifene (EVISTA) 60 MG tablet, TAKE 1 TABLET BY MOUTH EVERY DAY, Disp: 90 tablet, Rfl: 3 .  SYNTHROID 125 MCG tablet, TAKE 1 TABLET BY MOUTH EVERY DAY, Disp: 90 tablet, Rfl: 3 .  vitamin B-12 (CYANOCOBALAMIN) 1000 MCG tablet, Take 1,000 mcg by mouth daily., Disp: , Rfl:  .  Zinc 50 MG CAPS, Take 1 capsule by mouth daily., Disp: , Rfl:  .  pantoprazole (PROTONIX) 40 MG tablet, Take 1 tablet (40 mg total) by mouth daily., Disp: 30 tablet, Rfl: 3     Steffanie Dunn, DO [facility] 07/06/2019 3:18 PM

## 2019-07-07 ENCOUNTER — Ambulatory Visit: Payer: PPO | Admitting: Family Medicine

## 2019-07-07 ENCOUNTER — Telehealth (INDEPENDENT_AMBULATORY_CARE_PROVIDER_SITE_OTHER): Payer: PPO | Admitting: Family Medicine

## 2019-07-07 ENCOUNTER — Encounter: Payer: Self-pay | Admitting: Family Medicine

## 2019-07-07 DIAGNOSIS — M545 Low back pain: Secondary | ICD-10-CM | POA: Diagnosis not present

## 2019-07-07 DIAGNOSIS — G8929 Other chronic pain: Secondary | ICD-10-CM | POA: Diagnosis not present

## 2019-07-07 NOTE — Progress Notes (Signed)
Subjective:    Patient ID: Lori Durham, female    DOB: 1950-05-02, 69 y.o.   MRN: [id number]  HPI Virtual Visit via Video Note  I connected with the patient on 07/07/19 at  1:30 PM EDT by a video enabled telemedicine application and verified that I am speaking with the correct person using two identifiers.  Location patient: home Location provider:work or home office Persons participating in the virtual visit: patient, provider  I discussed the limitations of evaluation and management by telemedicine and the availability of in person appointments. The patient expressed understanding and agreed to proceed.   HPI: Here to discuss the possibility of another bariatric surgery. She met with Dr. Noemi Chapel in Pulmonary yesterday, and she strongly recommended that Jermiah have another procedure. On 11-15-09 she had a sleeve gastrectomy in [facility], and she was able to lose 120 lbs after that. However over the years she has gained much of this back, and it is negatively affecting her health in many ways. She is very interested in having a second procedure, but she cannot go back to the original surgeon because he is now out of her insurance network.    ROS: See pertinent positives and negatives per HPI.  Past Medical History:  Diagnosis Date  . Asthma, mild intermittent   . GERD (gastroesophageal reflux disease)    watches diet  . History of colon polyps   . History of diabetes mellitus    no issue since gastric sleeve surgery and lost wt.  . History of exercise stress test    ETT--  06-17-2007--  NORMAL  . History of hypertension    no issue since gastric sleeve surgery and lost wt.  . Hyperlipidemia   . Hypothyroidism, postsurgical   . OSA (obstructive sleep apnea)    MODERATE OSA PER STUDY 09-25-2009--- no longer uses cpap,  no issue since gastric sleeve surgery and lost wt.  . Osteoporosis    last DEXA on 11-20-10 was normal   . PONV (postoperative nausea and vomiting)   .  Right knee meniscal tear   . Varicose veins of both lower extremities with pain   . Vitamin D deficiency   . Wears glasses     Past Surgical History:  Procedure Laterality Date  . COLONOSCOPY  12-07-11   per Dr. Harrell Lark at [facility], clear (hx of adenomatous polyps), repeat in 5 yrs   . KNEE ARTHROSCOPY Right 08/10/2014   Procedure: ARTHROSCOPY RIGHT KNEE WITH DEBRIDEMENT;  Surgeon: Paralee Cancel, MD;  Location: [facility];  Service: Orthopedics;  Laterality: Right;  . LAPAROSCOPIC CHOLECYSTECTOMY  Nov 2012  [place]  . LAPAROSCOPIC GASTRIC SLEEVE RESECTION  11-15-2009   [place]  . THYROID LOBECTOMY Left 1991   benign  . TONSILLECTOMY AND ADENOIDECTOMY  age 66  . VAGINAL HYSTERECTOMY  1995   w/ ovaries intact    Family History  Problem Relation Age of Onset  . Hypothyroidism Mother   . Heart disease Mother   . Hypertension Mother   . Alcohol abuse Father   . Heart disease Maternal Grandmother   . Alcohol abuse Paternal Grandfather   . Alcohol abuse Brother   . Heart disease Brother   . Heart attack Brother 38  . Alcohol abuse Brother   . Osteoporosis Brother   . Other Brother        varicose veins  . Osteoporosis Brother   . Arthritis Other  family hx of  . Coronary artery disease Other        family history of  . Hyperlipidemia Other        family history of  . Hypertension Other        family history of     Current Outpatient Medications:  .  albuterol (VENTOLIN HFA) 108 (90 Base) MCG/ACT inhaler, Inhale 2 puffs into the lungs every 4 (four) hours as needed for wheezing or shortness of breath., Disp: 18 g, Rfl: 5 .  ALPRAZolam (XANAX) 1 MG tablet, Take 1 tablet (1 mg total) by mouth 2 (two) times daily as needed for anxiety or sleep., Disp: 60 tablet, Rfl: 5 .  amLODipine (NORVASC) 10 MG tablet, Take 1 tablet (10 mg total) by mouth daily., Disp: 90 tablet, Rfl: 3 .  BIOTIN PO, Take 1 tablet by mouth daily., Disp: , Rfl:  .   Calcium-Magnesium-Vitamin D 121-97-588 MG-MG-UNIT TB24, Take by mouth daily., Disp: , Rfl:  .  Cholecalciferol (VITAMIN D3) 1000 UNITS CAPS, Take 1 capsule by mouth daily., Disp: , Rfl:  .  Coenzyme Q10 (COQ10) 100 MG CAPS, Take by mouth once., Disp: , Rfl:  .  fish oil-omega-3 fatty acids 1000 MG capsule, Take 2 g by mouth daily., Disp: , Rfl:  .  fluconazole (DIFLUCAN) 150 MG tablet, TAKE 1 TABLET BY MOUTH AS ONE DOSE, Disp: 2 tablet, Rfl: 5 .  Glucosamine-Chondroit-Vit C-Mn (GLUCOSAMINE 1500 COMPLEX PO), Take 2 tablets by mouth daily., Disp: , Rfl:  .  ketoconazole (NIZORAL) 2 % cream, Apply 1 application topically 2 (two) times daily., Disp: 60 g, Rfl: 2 .  liothyronine (CYTOMEL) 5 MCG tablet, TAKE 1 TABLET BY MOUTH EVERY DAY, Disp: 90 tablet, Rfl: 3 .  Multiple Vitamin (MULTIVITAMIN) tablet, Take 1 tablet by mouth daily., Disp: , Rfl:  .  omeprazole (PRILOSEC) 40 MG capsule, Take 1 capsule (40 mg total) by mouth 2 (two) times daily., Disp: 60 capsule, Rfl: 3 .  pantoprazole (PROTONIX) 40 MG tablet, Take 1 tablet (40 mg total) by mouth daily., Disp: 30 tablet, Rfl: 3 .  Probiotic Product (ALIGN) 4 MG CAPS, Take 1 capsule by mouth daily., Disp: , Rfl:  .  raloxifene (EVISTA) 60 MG tablet, TAKE 1 TABLET BY MOUTH EVERY DAY, Disp: 90 tablet, Rfl: 3 .  SYNTHROID 125 MCG tablet, TAKE 1 TABLET BY MOUTH EVERY DAY, Disp: 90 tablet, Rfl: 3 .  vitamin B-12 (CYANOCOBALAMIN) 1000 MCG tablet, Take 1,000 mcg by mouth daily., Disp: , Rfl:  .  Zinc 50 MG CAPS, Take 1 capsule by mouth daily., Disp: , Rfl:   EXAM:  VITALS per patient if applicable:  GENERAL: alert, oriented, appears well and in no acute distress  HEENT: atraumatic, conjunttiva clear, no obvious abnormalities on inspection of external nose and ears  NECK: normal movements of the head and neck  LUNGS: on inspection no signs of respiratory distress, breathing rate appears normal, no obvious gross SOB, gasping or wheezing  CV: no obvious  cyanosis  MS: moves all visible extremities without noticeable abnormality  PSYCH/NEURO: pleasant and cooperative, no obvious depression or anxiety, speech and thought processing grossly intact  ASSESSMENT AND PLAN: Obesity. We agree that she would be a good candidate for bariatric surgery, so I will refer her to [facility] to discuss this.  Alysia Penna, MD  Discussed the following assessment and plan:  No diagnosis found.     I discussed the assessment and treatment plan with the  patient. The patient was provided an opportunity to ask questions and all were answered. The patient agreed with the plan and demonstrated an understanding of the instructions.   The patient was advised to call back or seek an in-person evaluation if the symptoms worsen or if the condition fails to improve as anticipated.     Review of Systems     Objective:   Physical Exam        Assessment & Plan:

## 2019-07-19 ENCOUNTER — Ambulatory Visit: Payer: PPO | Admitting: Gastroenterology

## 2019-09-07 ENCOUNTER — Ambulatory Visit: Payer: PPO | Admitting: Critical Care Medicine

## 2019-09-07 ENCOUNTER — Other Ambulatory Visit: Payer: Self-pay

## 2019-09-07 ENCOUNTER — Encounter: Payer: Self-pay | Admitting: Critical Care Medicine

## 2019-09-07 VITALS — BP 142/78 | HR 86 | Temp 98.3°F | Ht 65.0 in | Wt 259.0 lb

## 2019-09-07 DIAGNOSIS — R05 Cough: Secondary | ICD-10-CM | POA: Diagnosis not present

## 2019-09-07 DIAGNOSIS — K219 Gastro-esophageal reflux disease without esophagitis: Secondary | ICD-10-CM

## 2019-09-07 DIAGNOSIS — R053 Chronic cough: Secondary | ICD-10-CM

## 2019-09-07 DIAGNOSIS — M810 Age-related osteoporosis without current pathological fracture: Secondary | ICD-10-CM

## 2019-09-07 DIAGNOSIS — R42 Dizziness and giddiness: Secondary | ICD-10-CM | POA: Diagnosis not present

## 2019-09-07 MED ORDER — FAMOTIDINE 20 MG PO TABS: 20.0000 mg | ORAL_TABLET | Freq: Two times a day (BID) | ORAL | 11 refills | Status: DC

## 2019-09-07 NOTE — Patient Instructions (Addendum)
Thank you for visiting Dr. Chestine Spore at [facility]. We recommend the following: Orders Placed This Encounter  Procedures  . PT vestibular rehab   Orders Placed This Encounter  Procedures  . PT vestibular rehab    Standing Status:   Future    Standing Expiration Date:   09/06/2020   Continue pantoprazole once daily.  After 1 week on pepcid, decrease to 1/2 pill per day for 1 week. Then decrease to 1/2 pill every other day for 1 week.  Then stop pantoprazole and keep taking pepcid two times daily.   Meds ordered this encounter  Medications  . famotidine (PEPCID) 20 MG tablet    Sig: Take 1 tablet (20 mg total) by mouth 2 (two) times daily.    Dispense:  30 tablet    Refill:  11    Return in about 3 months (around 12/08/2019).    Please do your part to reduce the spread of COVID-19.

## 2019-09-07 NOTE — Progress Notes (Signed)
Synopsis: Referred in April 2021 for chronic cough by Nelwyn SalisburyFry, Stephen A, MD.  Subjective:   PATIENT ID: Lori Durham GENDER: female DOB: 1950/06/06, MRN: [id number]  Chief Complaint  Patient presents with  . Follow-up    no DOE, dry cough, GERD, protonix helping but concerned about osteoporosis    Ms. Lori Durham is a 69 y/o woman who presents for follow-up of chronic cough.  She previously had 2 episodes of cough syncope prior to her last encounter, but likely has not had any additional episodes.  She has noticed improvement since switching from omeprazole to pantoprazole and had resolution of her rash.  She is no longer having episodes of vomiting when she is for starts eating.  She continues to have ongoing vertigo, which has been a chronic issue for her over time.  Her primary care physician referred her to currently to surgery for bariatric surgery evaluation, but she is not interested in a Roux-en-Y.  She would like a revision of her for sleeve gastrectomy, which is not done in [place]. She is considering moving back to [place] in the next few months.     OV 07/06/19: Mr. Lori Durham is a 69 y/o woman who presents for evaluation of chronic cough.  For the past few months her cough has been worse, occurring more frequently.  She has had 2 episodes of cough syncope.  She coughs more when she first lays down.  Her cough resolved fully when she lost 120 pounds after bariatric surgery (sleeve gastrectomy).  As she slowly regained weight her cough returned.  She has a hiatal hernia.  She was treated for acute bronchitis with doxycycline with some improvement in her symptoms.  She was started on omeprazole in February with some improvement in her cough.  Albuterol has improved her cough, which she began in March.  She is using this 2 to 3 days/week.  Since starting omeprazole she has had a nonitching, red rash on her arms.  She does not think this is a limitation to her continuing this medication.  She has no  previous history of lung disease or tobacco use.  No family history of lung disease.  She has a history of chronic allergies.  She was evaluated by Dr. Stevphen [place]Eugene [place] many years ago and was told she is slightly allergic to cats, mold, dust, and other common things.  Her symptoms have been slightly worse recently, she denies postnasal drip.  She takes cetirizine periodically.  She lives in an older house.  She complains of chronic vertigo, but denies sensation of spinning.  She more feels unsteady and has balance issues.  She feels that this is worse when she is tired.  She is relatively immobile due to chronic knee pain.  She has trouble walking up the stairs.   Past Medical History:  Diagnosis Date  . Asthma, mild intermittent   . GERD (gastroesophageal reflux disease)    watches diet  . History of colon polyps   . History of diabetes mellitus    no issue since gastric sleeve surgery and lost wt.  . History of exercise stress test    ETT--  06-17-2007--  NORMAL  . History of hypertension    no issue since gastric sleeve surgery and lost wt.  . Hyperlipidemia   . Hypothyroidism, postsurgical   . OSA (obstructive sleep apnea)    MODERATE OSA PER STUDY 09-25-2009--- no longer uses cpap,  no issue since gastric sleeve surgery and lost wt.  .Marland Kitchen  Osteoporosis    last DEXA on 11-20-10 was normal   . PONV (postoperative nausea and vomiting)   . Right knee meniscal tear   . Varicose veins of both lower extremities with pain   . Vitamin D deficiency   . Wears glasses      Family History  Problem Relation Age of Onset  . Hypothyroidism Mother   . Heart disease Mother   . Hypertension Mother   . Alcohol abuse Father   . Heart disease Maternal Grandmother   . Alcohol abuse Paternal Grandfather   . Alcohol abuse Brother   . Heart disease Brother   . Heart attack Brother 42  . Alcohol abuse Brother   . Osteoporosis Brother   . Other Brother        varicose veins  . Osteoporosis Brother     . Arthritis Other        family hx of  . Coronary artery disease Other        family history of  . Hyperlipidemia Other        family history of  . Hypertension Other        family history of     Past Surgical History:  Procedure Laterality Date  . COLONOSCOPY  12-07-11   per Dr. Rodena Piety at [facility], clear (hx of adenomatous polyps), repeat in 5 yrs   . KNEE ARTHROSCOPY Right 08/10/2014   Procedure: ARTHROSCOPY RIGHT KNEE WITH DEBRIDEMENT;  Surgeon: Durene Romans, MD;  Location: [facility];  Service: Orthopedics;  Laterality: Right;  . LAPAROSCOPIC CHOLECYSTECTOMY  Nov 2012  [place]  . LAPAROSCOPIC GASTRIC SLEEVE RESECTION  11-15-2009   [place]  . THYROID LOBECTOMY Left 1991   benign  . TONSILLECTOMY AND ADENOIDECTOMY  age 31  . VAGINAL HYSTERECTOMY  1995   w/ ovaries intact    Social History   Socioeconomic History  . Marital status: Married    Spouse name: WR  . Number of children: 1  . Years of education: Not on file  . Highest education level: Some college, no degree  Occupational History  . Occupation: Field seismologist: Herbalist  AND  NURSERY    Comment: [employer]  Tobacco Use  . Smoking status: Never Smoker  . Smokeless tobacco: Never Used  Vaping Use  . Vaping Use: Never used  Substance and Sexual Activity  . Alcohol use: Yes    Comment: occ  . Drug use: No  . Sexual activity: Not on file  Other Topics Concern  . Not on file  Social History Narrative   Patient is right-handed. She lives with her husband in a 2 level home. She drinks 1 cup of coffee a day. She does not exercise.. she is currently on STD with her job.   Social Determinants of Health   Financial Resource Strain:   . Difficulty of Paying Living Expenses:   Food Insecurity:   . Worried About Programme researcher, broadcasting/film/video in the Last Year:   . Barista in the Last Year:   Transportation Needs:   . Freight forwarder (Medical):   Marland Kitchen Lack of  Transportation (Non-Medical):   Physical Activity:   . Days of Exercise per Week:   . Minutes of Exercise per Session:   Stress:   . Feeling of Stress :   Social Connections:   . Frequency of Communication with Friends and Family:   . Frequency of  Social Gatherings with Friends and Family:   . Attends Religious Services:   . Active Member of Clubs or Organizations:   . Attends Banker Meetings:   Marland Kitchen Marital Status:   Intimate Partner Violence:   . Fear of Current or Ex-Partner:   . Emotionally Abused:   Marland Kitchen Physically Abused:   . Sexually Abused:      Allergies  Allergen Reactions  . Augmentin [Amoxicillin-Pot Clavulanate] Hives    Has patient had a PCN reaction causing immediate rash, facial/tongue/throat swelling, SOB or lightheadedness with hypotension: Yes Has patient had a PCN reaction causing severe rash involving mucus membranes or skin necrosis: No Has patient had a PCN reaction that required hospitalization: No Has patient had a PCN reaction occurring within the last 10 years: Yes If all of the above answers are "NO", then may proceed with Cephalosporin use.   . Contrast Media [Iodinated Diagnostic Agents] Hives    Caused skin to burn  . Doxycycline     headache  . Hydrocodone Other (See Comments)    ( cough syrup ) light headed     Immunization History  Administered Date(s) Administered  . Influenza Split 12/25/2011  . Influenza Whole 12/05/2009  . Influenza, High Dose Seasonal PF 01/11/2019  . Influenza,inj,Quad PF,6+ Mos 02/17/2013, 12/14/2013, 12/10/2017  . Influenza-Unspecified 12/21/2014, 04/24/2016, 01/14/2017  . PFIZER SARS-COV-2 Vaccination 04/29/2019, 05/20/2019  . Pneumococcal Polysaccharide-23 08/13/2017  . Zoster 02/23/2013  . Zoster Recombinat (Shingrix) 08/12/2017, 11/26/2017    Outpatient Medications Prior to Visit  Medication Sig Dispense Refill  . albuterol (VENTOLIN HFA) 108 (90 Base) MCG/ACT inhaler Inhale 2 puffs into the  lungs every 4 (four) hours as needed for wheezing or shortness of breath. 18 g 5  . ALPRAZolam (XANAX) 1 MG tablet Take 1 tablet (1 mg total) by mouth 2 (two) times daily as needed for anxiety or sleep. 60 tablet 5  . amLODipine (NORVASC) 10 MG tablet Take 1 tablet (10 mg total) by mouth daily. 90 tablet 3  . BIOTIN PO Take 1 tablet by mouth daily.    . Calcium-Magnesium-Vitamin D 600-40-500 MG-MG-UNIT TB24 Take by mouth daily.    . Cholecalciferol (VITAMIN D3) 1000 UNITS CAPS Take 1 capsule by mouth daily.    . Coenzyme Q10 (COQ10) 100 MG CAPS Take by mouth once.    . fish oil-omega-3 fatty acids 1000 MG capsule Take 2 g by mouth daily.    . fluconazole (DIFLUCAN) 150 MG tablet TAKE 1 TABLET BY MOUTH AS ONE DOSE 2 tablet 5  . Glucosamine-Chondroit-Vit C-Mn (GLUCOSAMINE 1500 COMPLEX PO) Take 2 tablets by mouth daily.    Marland Kitchen ketoconazole (NIZORAL) 2 % cream Apply 1 application topically 2 (two) times daily. 60 g 2  . liothyronine (CYTOMEL) 5 MCG tablet TAKE 1 TABLET BY MOUTH EVERY DAY 90 tablet 3  . Multiple Vitamin (MULTIVITAMIN) tablet Take 1 tablet by mouth daily.    . pantoprazole (PROTONIX) 40 MG tablet Take 1 tablet (40 mg total) by mouth daily. 30 tablet 3  . Probiotic Product (ALIGN) 4 MG CAPS Take 1 capsule by mouth daily.    . raloxifene (EVISTA) 60 MG tablet TAKE 1 TABLET BY MOUTH EVERY DAY 90 tablet 3  . SYNTHROID 125 MCG tablet TAKE 1 TABLET BY MOUTH EVERY DAY 90 tablet 3  . vitamin B-12 (CYANOCOBALAMIN) 1000 MCG tablet Take 1,000 mcg by mouth daily.    . Zinc 50 MG CAPS Take 1 capsule by mouth daily.    Marland Kitchen  omeprazole (PRILOSEC) 40 MG capsule Take 1 capsule (40 mg total) by mouth 2 (two) times daily. (Patient not taking: Reported on 09/07/2019) 60 capsule 3   No facility-administered medications prior to visit.    Review of Systems  Constitutional: Negative for chills, fever and weight loss.  HENT: Negative for congestion.   Respiratory: Positive for cough. Negative for wheezing.     Cardiovascular: Positive for leg swelling. Negative for chest pain.  Gastrointestinal: Positive for heartburn.  Musculoskeletal: Positive for joint pain.  Skin: Positive for rash. Negative for itching.  Neurological: Positive for dizziness.       Abnormal balance  Endo/Heme/Allergies: Positive for environmental allergies.  Psychiatric/Behavioral: The patient is nervous/anxious and has insomnia.      Objective:   Vitals:   09/07/19 1344  BP: (!) 142/78  Pulse: 86  Temp: 98.3 F (36.8 C)  TempSrc: Oral  SpO2: 98%  Weight: 259 lb (117.5 kg)  Height: 5\' 5"  (1.651 m)   98% on   RA BMI Readings from Last 3 Encounters:  09/07/19 43.10 kg/m  07/06/19 43.95 kg/m  05/26/19 42.50 kg/m   Wt Readings from Last 3 Encounters:  09/07/19 259 lb (117.5 kg)  07/06/19 262 lb 3.2 oz (118.9 kg)  05/26/19 255 lb 6.4 oz (115.8 kg)    Physical Exam Vitals reviewed.  Constitutional:      Appearance: She is obese. She is not ill-appearing.  HENT:     Head: Normocephalic and atraumatic.  Eyes:     General: No scleral icterus. Cardiovascular:     Rate and Rhythm: Normal rate and regular rhythm.     Heart sounds: No murmur heard.   Pulmonary:     Comments: Breathing comfortably on room air, no conversational dyspnea.  Clear to auscultation bilaterally. Abdominal:     General: There is no distension.     Palpations: Abdomen is soft.     Tenderness: There is no abdominal tenderness.  Musculoskeletal:        General: No swelling or deformity.     Cervical back: Neck supple.  Lymphadenopathy:     Cervical: No cervical adenopathy.  Skin:    General: Skin is warm and dry.     Findings: No erythema or rash.  Neurological:     General: No focal deficit present.     Mental Status: She is alert.     Coordination: Coordination normal.  Psychiatric:        Mood and Affect: Mood normal.        Behavior: Behavior normal.      CBC    Component Value Date/Time   WBC 7.5 05/26/2019  0950   RBC 4.18 05/26/2019 0950   HGB 14.9 05/26/2019 0950   HCT 43.6 05/26/2019 0950   PLT 287.0 05/26/2019 0950   MCV 104.3 (H) 05/26/2019 0950   MCH 34.8 (H) 01/26/2018 1236   MCHC 34.1 05/26/2019 0950   RDW 12.1 05/26/2019 0950   LYMPHSABS 2.4 05/26/2019 0950   MONOABS 0.5 05/26/2019 0950   EOSABS 0.4 05/26/2019 0950   BASOSABS 0.1 05/26/2019 0950    CHEMISTRY No results for input(s): NA, K, CL, CO2, GLUCOSE, BUN, CREATININE, CALCIUM, MG, PHOS in the last 168 hours. CrCl cannot be calculated (Patient's most recent lab result is older than the maximum 21 days allowed.).   Chest Imaging- films reviewed: CXR, 2 view 07/05/2017-faint right middle lobe opacity.  Increased lung markings bilaterally.  Pulmonary Functions Testing Results: No flowsheet data found.  Assessment & Plan:     ICD-10-CM   1. Vertigo  R42   2. Chronic cough  R05 PT vestibular rehab  3. Gastroesophageal reflux disease, unspecified whether esophagitis present  K21.9 PT vestibular rehab  4. Age related osteoporosis, unspecified pathological fracture presence  M81.0    Chronic cough due to GERD-improved.  No additional episodes of cough syncope.   -Continue daily antihistamine -I understand her concern about osteoporosis worsening on a PPI.  Start Pepcid twice daily.  Overlap this with Protonix for 1 week.  After 1 week decrease to half dose daily.  After another week decrease to half dose every other day for 1 week, then stop.  If her cough returns during this, she may require combination low-dose pantoprazole and Pepcid.  Recommend ongoing dietary and lifestyle modifications to optimize reflux  Osteoporosis -Ongoing management per primary care physician -Recommend regular weightbearing exercise -We will try to decrease dose of PPI is much as possible.  Start Pepcid twice daily and taper PPI as above.  Vertigo -Vestibular PT -Additional management per PCP  RTC in 3 months.    Current  Outpatient Medications:  .  albuterol (VENTOLIN HFA) 108 (90 Base) MCG/ACT inhaler, Inhale 2 puffs into the lungs every 4 (four) hours as needed for wheezing or shortness of breath., Disp: 18 g, Rfl: 5 .  ALPRAZolam (XANAX) 1 MG tablet, Take 1 tablet (1 mg total) by mouth 2 (two) times daily as needed for anxiety or sleep., Disp: 60 tablet, Rfl: 5 .  amLODipine (NORVASC) 10 MG tablet, Take 1 tablet (10 mg total) by mouth daily., Disp: 90 tablet, Rfl: 3 .  BIOTIN PO, Take 1 tablet by mouth daily., Disp: , Rfl:  .  Calcium-Magnesium-Vitamin D 600-40-500 MG-MG-UNIT TB24, Take by mouth daily., Disp: , Rfl:  .  Cholecalciferol (VITAMIN D3) 1000 UNITS CAPS, Take 1 capsule by mouth daily., Disp: , Rfl:  .  Coenzyme Q10 (COQ10) 100 MG CAPS, Take by mouth once., Disp: , Rfl:  .  fish oil-omega-3 fatty acids 1000 MG capsule, Take 2 g by mouth daily., Disp: , Rfl:  .  fluconazole (DIFLUCAN) 150 MG tablet, TAKE 1 TABLET BY MOUTH AS ONE DOSE, Disp: 2 tablet, Rfl: 5 .  Glucosamine-Chondroit-Vit C-Mn (GLUCOSAMINE 1500 COMPLEX PO), Take 2 tablets by mouth daily., Disp: , Rfl:  .  ketoconazole (NIZORAL) 2 % cream, Apply 1 application topically 2 (two) times daily., Disp: 60 g, Rfl: 2 .  liothyronine (CYTOMEL) 5 MCG tablet, TAKE 1 TABLET BY MOUTH EVERY DAY, Disp: 90 tablet, Rfl: 3 .  Multiple Vitamin (MULTIVITAMIN) tablet, Take 1 tablet by mouth daily., Disp: , Rfl:  .  pantoprazole (PROTONIX) 40 MG tablet, Take 1 tablet (40 mg total) by mouth daily., Disp: 30 tablet, Rfl: 3 .  Probiotic Product (ALIGN) 4 MG CAPS, Take 1 capsule by mouth daily., Disp: , Rfl:  .  raloxifene (EVISTA) 60 MG tablet, TAKE 1 TABLET BY MOUTH EVERY DAY, Disp: 90 tablet, Rfl: 3 .  SYNTHROID 125 MCG tablet, TAKE 1 TABLET BY MOUTH EVERY DAY, Disp: 90 tablet, Rfl: 3 .  vitamin B-12 (CYANOCOBALAMIN) 1000 MCG tablet, Take 1,000 mcg by mouth daily., Disp: , Rfl:  .  Zinc 50 MG CAPS, Take 1 capsule by mouth daily., Disp: , Rfl:      Steffanie Dunn, DO [facility] 09/07/2019 1:53 PM

## 2019-09-29 ENCOUNTER — Other Ambulatory Visit: Payer: Self-pay | Admitting: Family Medicine

## 2019-10-04 ENCOUNTER — Encounter: Payer: Self-pay | Admitting: Family Medicine

## 2019-10-06 NOTE — Telephone Encounter (Signed)
That's a great idea. Make an OV so we can take care of any last minute items

## 2019-10-09 ENCOUNTER — Telehealth: Payer: PPO | Admitting: Family Medicine

## 2019-10-30 DIAGNOSIS — R053 Chronic cough: Secondary | ICD-10-CM

## 2019-10-30 DIAGNOSIS — R42 Dizziness and giddiness: Secondary | ICD-10-CM

## 2019-10-30 NOTE — Telephone Encounter (Signed)
Dr. Chestine Spore please advise on patient email.  Thanks!   Dear Dr. Chestine Spore: The last time I saw you I mentioned to you that we might be moving to [place].  Well, things moved very quickly from there and we did move to [place]   I did not even have time to see the specialist you had referred me to.  You mentioned you could still treat me for a year after I moved.  What I would like for you to do when you have  time is to refer me to the same type of doctor at [facility] in [place] which is a 20 to 30 min. drive from me.  Would you mind to do this? Also, my insurance has changed to [employer] which is [employer].  I had another vertigo attack right after we moved here.  It put in the bed for a week.  I am just now getting around with a walker.  So I desperately need to see someone.  Thank you so much for any help you can lend me. Epifania Gore

## 2019-10-31 NOTE — Telephone Encounter (Signed)
Dr. Chestine Spore,  This is just an FYI from the patient.  See patient last comment.  Thank you.

## 2019-10-31 NOTE — Telephone Encounter (Signed)
Can we place a PT consult for vestibular rehab and a consult for a pulmonologist in [place] to ensure that she gets in with someone within the next 6 months or so. I am happy to still provide refills, but I cannot evaluate new complaints without office visits and I want to ensure there are no unnecessary lapses in her care. Thanks!  LPC

## 2019-10-31 NOTE — Telephone Encounter (Signed)
Referrals have been placed as requested.  Sent MyChart message to make patient aware.

## 2019-11-01 NOTE — Telephone Encounter (Signed)
That is sweet, thank you!  LPC

## 2019-11-09 ENCOUNTER — Telehealth: Payer: Self-pay | Admitting: Family Medicine

## 2019-11-09 NOTE — Telephone Encounter (Signed)
Left message for patient to schedule Annual Wellness Visit.  Please schedule with Nurse Health Advisor Shannon Crews, RN at [facility]  

## 2019-11-17 NOTE — Telephone Encounter (Signed)
Johny Drilling, please see new mychart response from pt

## 2019-11-17 NOTE — Telephone Encounter (Signed)
Multiple mychart messages sent by pt in regards to vestibular specialist referral. Pt sent message stating that the referral was needing to faxed to fax number [phone].  PCCs, please advise.

## 2020-01-05 DIAGNOSIS — J4520 Mild intermittent asthma, uncomplicated: Secondary | ICD-10-CM

## 2020-01-08 NOTE — Telephone Encounter (Signed)
Dr. Clark, please see pt's mychart message and advise. 

## 2020-02-05 ENCOUNTER — Other Ambulatory Visit: Payer: Self-pay | Admitting: Family Medicine

## 2020-03-18 ENCOUNTER — Telehealth: Payer: Self-pay | Admitting: Family Medicine

## 2020-03-18 NOTE — Telephone Encounter (Signed)
Spoke with patient.  She stated she has moved to [place] and wanted to let Dr Clent Ridges.  She wanted to thank Dr Clent Ridges for taking care of her.

## 2021-04-09 DIAGNOSIS — I2510 Atherosclerotic heart disease of native coronary artery without angina pectoris: Secondary | ICD-10-CM

## 2021-04-09 HISTORY — DX: Atherosclerotic heart disease of native coronary artery without angina pectoris: I25.10

## 2021-04-09 HISTORY — PX: CORONARY ANGIOPLASTY WITH STENT PLACEMENT: SHX49

## 2022-03-26 LAB — COLOGUARD

## 2022-04-06 LAB — COLOGUARD

## 2022-04-27 ENCOUNTER — Telehealth: Payer: Self-pay | Admitting: Family Medicine

## 2022-04-27 NOTE — Telephone Encounter (Signed)
Pt called to confirm if MD received all of her medical records from [place]?   Pt is scheduled to come in tomorrow at 10am.  Please Advise.

## 2022-04-28 ENCOUNTER — Ambulatory Visit: Payer: Medicare Other | Admitting: Family Medicine

## 2022-04-28 ENCOUNTER — Encounter: Payer: Self-pay | Admitting: Family Medicine

## 2022-04-28 ENCOUNTER — Ambulatory Visit (INDEPENDENT_AMBULATORY_CARE_PROVIDER_SITE_OTHER): Payer: PPO | Admitting: Family Medicine

## 2022-04-28 VITALS — BP 118/76 | HR 65 | Temp 98.4°F | Ht 65.0 in | Wt 250.0 lb

## 2022-04-28 DIAGNOSIS — H814 Vertigo of central origin: Secondary | ICD-10-CM

## 2022-04-28 DIAGNOSIS — E669 Obesity, unspecified: Secondary | ICD-10-CM | POA: Diagnosis not present

## 2022-04-28 DIAGNOSIS — G8929 Other chronic pain: Secondary | ICD-10-CM | POA: Diagnosis not present

## 2022-04-28 DIAGNOSIS — Z860100 Personal history of colon polyps, unspecified: Secondary | ICD-10-CM

## 2022-04-28 DIAGNOSIS — F418 Other specified anxiety disorders: Secondary | ICD-10-CM

## 2022-04-28 DIAGNOSIS — K219 Gastro-esophageal reflux disease without esophagitis: Secondary | ICD-10-CM

## 2022-04-28 DIAGNOSIS — E890 Postprocedural hypothyroidism: Secondary | ICD-10-CM

## 2022-04-28 DIAGNOSIS — E559 Vitamin D deficiency, unspecified: Secondary | ICD-10-CM

## 2022-04-28 DIAGNOSIS — R42 Dizziness and giddiness: Secondary | ICD-10-CM

## 2022-04-28 DIAGNOSIS — J4520 Mild intermittent asthma, uncomplicated: Secondary | ICD-10-CM | POA: Diagnosis not present

## 2022-04-28 DIAGNOSIS — M810 Age-related osteoporosis without current pathological fracture: Secondary | ICD-10-CM

## 2022-04-28 DIAGNOSIS — I10 Essential (primary) hypertension: Secondary | ICD-10-CM

## 2022-04-28 DIAGNOSIS — M25562 Pain in left knee: Secondary | ICD-10-CM | POA: Diagnosis not present

## 2022-04-28 DIAGNOSIS — E785 Hyperlipidemia, unspecified: Secondary | ICD-10-CM

## 2022-04-28 DIAGNOSIS — I2510 Atherosclerotic heart disease of native coronary artery without angina pectoris: Secondary | ICD-10-CM

## 2022-04-28 DIAGNOSIS — M545 Low back pain, unspecified: Secondary | ICD-10-CM

## 2022-04-28 MED ORDER — SYNTHROID 137 MCG PO TABS: 137.0000 ug | ORAL_TABLET | Freq: Every day | ORAL | 3 refills | Status: DC

## 2022-04-28 MED ORDER — LIOTHYRONINE SODIUM 5 MCG PO TABS: 5.0000 ug | ORAL_TABLET | Freq: Every day | ORAL | 3 refills | Status: DC

## 2022-04-28 MED ORDER — RALOXIFENE HCL 60 MG PO TABS: 60.0000 mg | ORAL_TABLET | Freq: Every day | ORAL | 3 refills | Status: DC

## 2022-04-28 MED ORDER — ALPRAZOLAM 1 MG PO TABS: ORAL_TABLET | ORAL | 1 refills | Status: DC

## 2022-04-28 MED ORDER — LEVETIRACETAM 750 MG PO TABS: 750.0000 mg | ORAL_TABLET | Freq: Two times a day (BID) | ORAL | 3 refills | Status: DC

## 2022-04-28 NOTE — Telephone Encounter (Signed)
Pt medical records have been received, pt has OV with Dr Sarajane Jews this morning

## 2022-04-28 NOTE — Progress Notes (Signed)
   Subjective:    Patient ID: Lori Durham, female    DOB: 21-Aug-1950, 72 y.o.   MRN: [id number]  HPI Here to re-establish after absence of 4 years. She had lived in [place], [place] until last week, when she moved to [place]. 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 She presented with SOB but no chest pain. She underwent angioplasty and 2 stents were placed. She has felt fine since then, but she needs to establish with a cardiologist here. Also she has had vertigo for years, and this was assumed to be of vestibular origin. However on 01-04-20 she had videonystagmography with rotary chair testing, and her vestibular system was found to be intact. The vertigo seems to be of central origin. Otherwise her BP and asthma are stable. Her anxiety and depression comes and goes, and her living with her current husband is very stressful for her. She wants refills on Zoloft. She had a well exam with her primary in [place], Dr. Renaldo Harrison on 02-27-22, and this came out well. Her labs were remarkable for an A1c of 5.6% and a creatinine of 0.7. Finally she has a lot of pain in the left knee, and a recent MRI revealed severe OA. She says she needs to have a replacement surgery, but she is too busy to think about this right now.    Review of Systems  Constitutional: Negative.   HENT: Negative.    Eyes: Negative.   Respiratory: Negative.    Cardiovascular: Negative.   Gastrointestinal: Negative.   Musculoskeletal:  Positive for arthralgias.  Neurological:  Positive for dizziness.  Psychiatric/Behavioral:  Positive for dysphoric mood. Negative for agitation, behavioral problems, confusion, decreased concentration, hallucinations, self-injury, sleep disturbance and suicidal ideas. The patient is nervous/anxious.        Objective:   Physical Exam Constitutional:      Appearance: She is obese.     Comments: She walks with a limp   Cardiovascular:     Rate and Rhythm: Normal rate and regular rhythm.      Pulses: Normal pulses.     Heart sounds: Normal heart sounds.  Pulmonary:     Effort: Pulmonary effort is normal.     Breath sounds: Normal breath sounds.  Musculoskeletal:     Right lower leg: No edema.     Left lower leg: No edema.  Neurological:     General: No focal deficit present.     Mental Status: She is alert and oriented to person, place, and time.  Psychiatric:        Mood and Affect: Mood normal.        Behavior: Behavior normal.        Thought Content: Thought content normal.           Assessment & Plan:  She is re-establishing with [place] for primary care. Her asthma and HTN and hypothyroidism are stable. 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 She had a NSTEMI a few months ago so we will refer her to Cardiology. Refer to GI for a colonoscopy. Her anxiety and depression are stable. 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 We spent a total of ( 35  ) minutes reviewing records and discussing these issues.  Alysia Penna, MD

## 2022-05-04 ENCOUNTER — Telehealth: Payer: Self-pay | Admitting: Family Medicine

## 2022-05-04 NOTE — Telephone Encounter (Signed)
Patient woke up with fever blister outbreak, requesting refill acyclovir (ZOVIRAX) ointment and pills.  [facility] [id number] - [place], [place] Phone: [phone]  Fax: [phone]

## 2022-05-04 NOTE — Telephone Encounter (Signed)
Last OV-04/28/22.   Medication currently not listed on med list. Pharmacy updated.

## 2022-05-06 MED ORDER — ACYCLOVIR 5 % EX OINT: 1.0000 | TOPICAL_OINTMENT | CUTANEOUS | 5 refills | Status: AC

## 2022-05-06 MED ORDER — ACYCLOVIR 800 MG PO TABS: 800.0000 mg | ORAL_TABLET | Freq: Three times a day (TID) | ORAL | 2 refills | Status: DC

## 2022-05-06 NOTE — Telephone Encounter (Signed)
Done

## 2022-05-06 NOTE — Addendum Note (Signed)
Addended by: Alysia Penna A on: 05/06/2022 08:48 AM   Modules accepted: Orders

## 2022-05-25 ENCOUNTER — Encounter: Payer: Self-pay | Admitting: Family Medicine

## 2022-05-25 NOTE — Telephone Encounter (Signed)
Have her make an OV so we can discuss options for her

## 2022-05-27 ENCOUNTER — Ambulatory Visit (INDEPENDENT_AMBULATORY_CARE_PROVIDER_SITE_OTHER): Payer: PPO | Admitting: Family Medicine

## 2022-05-27 ENCOUNTER — Encounter: Payer: Self-pay | Admitting: Family Medicine

## 2022-05-27 DIAGNOSIS — G8929 Other chronic pain: Secondary | ICD-10-CM

## 2022-05-27 DIAGNOSIS — M25562 Pain in left knee: Secondary | ICD-10-CM | POA: Diagnosis not present

## 2022-05-27 MED ORDER — METFORMIN HCL 500 MG PO TABS: 500.0000 mg | ORAL_TABLET | Freq: Two times a day (BID) | ORAL | 3 refills | Status: DC

## 2022-05-27 NOTE — Progress Notes (Signed)
   Subjective:    Patient ID: Lori Durham, female    DOB: 05-07-1950, 72 y.o.   MRN: [id number]  HPI Here asking for help with weight management. She has severe OA in both knees, and while living in [place] she received 2 cortisone shots to the left knee. The first one helped a little, but the second one did nothing. She was recommended to have a replacement surgery, but then she moved back here. She asks to see an orthopedist here now. She knows that losing weight would help with her knee pain, as well as helping numerous other problems. She had a sleeve gastrectomy in 2011 with minimal benefit. She has tried Phentermine in the past but it made her "heart race". She knows her insurance would likely not cover a GLP-1 medication. She tries to watch her diet but she cannot exercise much.    Review of Systems  Constitutional: Negative.   Respiratory: Negative.    Cardiovascular: Negative.   Musculoskeletal:  Positive for arthralgias.       Objective:   Physical Exam Constitutional:      Appearance: She is obese.  Cardiovascular:     Rate and Rhythm: Normal rate and regular rhythm.     Pulses: Normal pulses.     Heart sounds: Normal heart sounds.  Pulmonary:     Effort: Pulmonary effort is normal.     Breath sounds: Normal breath sounds.  Neurological:     Mental Status: She is alert.           Assessment & Plan:  We agree that losing weight would help with her knee pain, so she will try Metformin 500 mg BID. We can then increase this to 1000 mg BID if needed. We will refer her to Orthopedics because she would be a candidate for gel injections.  Alysia Penna, MD

## 2022-06-01 ENCOUNTER — Encounter: Payer: Self-pay | Admitting: Family Medicine

## 2022-06-08 ENCOUNTER — Ambulatory Visit (INDEPENDENT_AMBULATORY_CARE_PROVIDER_SITE_OTHER): Payer: PPO | Admitting: Family Medicine

## 2022-06-08 ENCOUNTER — Encounter: Payer: Self-pay | Admitting: Family Medicine

## 2022-06-08 ENCOUNTER — Ambulatory Visit (INDEPENDENT_AMBULATORY_CARE_PROVIDER_SITE_OTHER): Payer: PPO

## 2022-06-08 VITALS — BP 124/76 | HR 68 | Temp 98.2°F | Wt 253.0 lb

## 2022-06-08 DIAGNOSIS — J40 Bronchitis, not specified as acute or chronic: Secondary | ICD-10-CM | POA: Diagnosis not present

## 2022-06-08 DIAGNOSIS — R059 Cough, unspecified: Secondary | ICD-10-CM | POA: Diagnosis not present

## 2022-06-08 MED ORDER — LEVOFLOXACIN 500 MG PO TABS: 500.0000 mg | ORAL_TABLET | Freq: Every day | ORAL | 0 refills | Status: AC

## 2022-06-08 MED ORDER — BENZONATATE 200 MG PO CAPS: 200.0000 mg | ORAL_CAPSULE | Freq: Four times a day (QID) | ORAL | 1 refills | Status: AC | PRN

## 2022-06-08 NOTE — Progress Notes (Signed)
   Subjective:    Patient ID: Lori Durham, female    DOB: [birth year], 72 y.o.   MRN: [id number]  HPI Here for 2 weeks of a dry cough which is worse at night. No SOB or fever. She describes a sharp pain the right flank and the right shoulder blade for the past week. Using her inhaler at night.    Review of Systems  Constitutional: Negative.   HENT: Negative.    Eyes: Negative.   Respiratory:  Positive for cough. Negative for choking, chest tightness, shortness of breath and wheezing.   Cardiovascular: Negative.   Musculoskeletal:  Positive for back pain.       Objective:   Physical Exam Constitutional:      Appearance: Normal appearance.  Cardiovascular:     Rate and Rhythm: Normal rate and regular rhythm.     Pulses: Normal pulses.     Heart sounds: Normal heart sounds.  Pulmonary:     Effort: Pulmonary effort is normal. No respiratory distress.     Breath sounds: Normal breath sounds. No stridor. No wheezing, rhonchi or rales.  Chest:     Chest wall: No tenderness.  Musculoskeletal:     Comments: She is tender in the right upper back and the right flank   Lymphadenopathy:     Cervical: No cervical adenopathy.  Neurological:     Mental Status: She is alert.           Assessment & Plan:  Bronchitis, treat with 10 days of Levaquin. Use Benzonatate as needed. Get a CXR today. The back pain is a muscle strain, likely from coughing.  Alysia Penna, MD

## 2022-06-09 DIAGNOSIS — H43813 Vitreous degeneration, bilateral: Secondary | ICD-10-CM | POA: Diagnosis not present

## 2022-06-09 DIAGNOSIS — H2513 Age-related nuclear cataract, bilateral: Secondary | ICD-10-CM | POA: Diagnosis not present

## 2022-06-16 ENCOUNTER — Encounter: Payer: Self-pay | Admitting: Family Medicine

## 2022-06-17 MED ORDER — WEGOVY 0.25 MG/0.5ML ~~LOC~~ SOAJ: 0.2500 mg | SUBCUTANEOUS | 0 refills | Status: DC

## 2022-06-17 NOTE — Telephone Encounter (Signed)
I sent in for 4 weeks of Weygovy, so we will have to see what her insurance says

## 2022-06-17 NOTE — Telephone Encounter (Signed)
I'm sure they will require a PA, and that is when we will describe why she needs it

## 2022-06-22 ENCOUNTER — Encounter: Payer: Self-pay | Admitting: Family Medicine

## 2022-06-27 ENCOUNTER — Encounter: Payer: Self-pay | Admitting: Family Medicine

## 2022-07-01 ENCOUNTER — Other Ambulatory Visit (HOSPITAL_COMMUNITY): Payer: Self-pay

## 2022-07-02 DIAGNOSIS — M170 Bilateral primary osteoarthritis of knee: Secondary | ICD-10-CM | POA: Diagnosis not present

## 2022-07-02 DIAGNOSIS — M1711 Unilateral primary osteoarthritis, right knee: Secondary | ICD-10-CM | POA: Diagnosis not present

## 2022-07-02 DIAGNOSIS — M1712 Unilateral primary osteoarthritis, left knee: Secondary | ICD-10-CM | POA: Diagnosis not present

## 2022-07-13 ENCOUNTER — Encounter: Payer: Self-pay | Admitting: Family Medicine

## 2022-07-14 MED ORDER — WEGOVY 0.5 MG/0.5ML ~~LOC~~ SOAJ: 0.5000 mg | SUBCUTANEOUS | 0 refills | Status: DC

## 2022-07-14 NOTE — Telephone Encounter (Signed)
Yes we have lots of room to move up if needed. I sent in for 4 weeks of the 0.5 mg dose. If she's not happy a that, we can increase it

## 2022-07-15 ENCOUNTER — Encounter: Payer: Self-pay | Admitting: Family Medicine

## 2022-07-16 ENCOUNTER — Other Ambulatory Visit: Payer: Self-pay

## 2022-07-16 NOTE — Telephone Encounter (Signed)
PA has been sent to plan Epifania Gore ([facility])  Your information has been sent to [employer].

## 2022-07-20 NOTE — Progress Notes (Signed)
Garen Grams, MD Reason for referral-coronary artery disease  HPI: 72 year old female for evaluation of coronary artery disease at request of Gershon Crane, MD.  Patient had non-ST elevation myocardial infarction January 2023 ([place]).  CTA January 2023 showed no aortic dissection or aneurysm; note of 4 mm lung nodule and follow-up recommended 1 year if not low risk.  Cardiac catheterization January 2023 showed 90% LAD, 50% diagonal, occluded RCA; patient had PCI of the LAD with 2 drug-eluting stents.  Monitor January 2023 showed 30 seconds nonsustained ventricular tachycardia.  Nuclear study August 2023 showed ejection fraction 62%, diaphragmatic attenuation but no ischemia.  Cardiology now asked to evaluate.  Patient occasionally has chest pressure when she feels "stressed" but denies exertional chest pain.  She has dyspnea with more vigorous activities though she is limited by dizziness and knee arthralgias.  She has not had syncope.  Current Outpatient Medications  Medication Sig Dispense Refill   acyclovir (ZOVIRAX) 800 MG tablet Take 1 tablet (800 mg total) by mouth 3 (three) times daily. 60 tablet 2   acyclovir ointment (ZOVIRAX) 5 % Apply 1 Application topically every 3 (three) hours. 15 g 5   albuterol (VENTOLIN HFA) 108 (90 Base) MCG/ACT inhaler INHALE 2 PUFFS INTO THE LUNGS EVERY 4 (FOUR) HOURS AS NEEDED FOR WHEEZING OR SHORTNESS OF BREATH. 18 g 2   ALPRAZolam (XANAX) 1 MG tablet Taking 11/2 daily as needed 135 tablet 1   amLODipine (NORVASC) 2.5 MG tablet Take 2.5 mg by mouth daily.     benzonatate (TESSALON) 200 MG capsule Take 1 capsule (200 mg total) by mouth every 6 (six) hours as needed for cough. 60 capsule 1   BIOTIN PO Take 1 tablet by mouth daily. Twice a week     clopidogrel (PLAVIX) 75 MG tablet Take 75 mg by mouth daily.     Coenzyme Q10 (COQ-10 PO) Take by mouth every other day.     fish oil-omega-3 fatty acids 1000 MG capsule Take 2 g by mouth daily.      fluconazole (DIFLUCAN) 150 MG tablet TAKE 1 TABLET BY MOUTH AS ONE DOSE 2 tablet 5   Glucosamine-Chondroit-Vit C-Mn (GLUCOSAMINE 1500 COMPLEX PO) Take 2 tablets by mouth daily.     ketoconazole (NIZORAL) 2 % cream Apply 1 application topically 2 (two) times daily. 60 g 2   levETIRAcetam (KEPPRA) 750 MG tablet Take 1 tablet (750 mg total) by mouth 2 (two) times daily. 180 tablet 3   liothyronine (CYTOMEL) 5 MCG tablet Take 1 tablet (5 mcg total) by mouth daily. 90 tablet 3   MAGNESIUM-ZINC PO Take by mouth.     metoprolol tartrate (LOPRESSOR) 25 MG tablet Take 25 mg by mouth 2 (two) times daily.     Multiple Vitamin (MULTIVITAMIN) tablet Take 1 tablet by mouth daily.     pantoprazole (PROTONIX) 40 MG tablet Take 1 tablet (40 mg total) by mouth daily. 30 tablet 3   Probiotic Product (ALIGN PO) Take by mouth daily.     Probiotic Product (ALIGN) 4 MG CAPS Take 1 capsule by mouth daily.     raloxifene (EVISTA) 60 MG tablet Take 1 tablet (60 mg total) by mouth daily. 90 tablet 3   rosuvastatin (CRESTOR) 20 MG tablet Take 20 mg by mouth daily.     SYNTHROID 137 MCG tablet Take 1 tablet (137 mcg total) by mouth daily before breakfast. 90 tablet 3   vitamin B-12 (CYANOCOBALAMIN) 1000 MCG tablet Take 1,000 mcg by mouth daily.  aspirin EC 81 MG tablet Take 1 tablet by mouth daily.     Cholecalciferol (VITAMIN D3) 1000 UNITS CAPS Take 1 capsule by mouth daily. Twice per week     nystatin-triamcinolone (MYCOLOG II) cream      Semaglutide-Weight Management (WEGOVY) 0.5 MG/0.5ML SOAJ Inject 0.5 mg into the skin once a week. (Patient not taking: Reported on 07/27/2022) 2 mL 0   No current facility-administered medications for this visit.    Allergies  Allergen Reactions   Augmentin [Amoxicillin-Pot Clavulanate] Hives    Has patient had a PCN reaction causing immediate rash, facial/tongue/throat swelling, SOB or lightheadedness with hypotension: Yes Has patient had a PCN reaction causing severe rash  involving mucus membranes or skin necrosis: No Has patient had a PCN reaction that required hospitalization: No Has patient had a PCN reaction occurring within the last 10 years: Yes If all of the above answers are "NO", then may proceed with Cephalosporin use.    Contrast Media [Iodinated Contrast Media] Hives    Caused skin to burn   Doxycycline     headache   Hydrocodone Other (See Comments)    ( cough syrup ) light headed     Past Medical History:  Diagnosis Date   Asthma, mild intermittent    CAD (coronary artery disease) 04/09/2021   she had a NSTEMI, 2 stents were placed   GERD (gastroesophageal reflux disease)    watches diet   History of colon polyps    History of diabetes mellitus    no issue since gastric sleeve surgery and lost wt.   History of exercise stress test    ETT--  06-17-2007--  NORMAL   History of hypertension    no issue since gastric sleeve surgery and lost wt.   Hyperlipidemia    Hypothyroidism, postsurgical    OSA (obstructive sleep apnea)    MODERATE OSA PER STUDY 09-25-2009--- no longer uses cpap,  no issue since gastric sleeve surgery and lost wt.   Osteoporosis    last DEXA on 11-20-10 was normal    PONV (postoperative nausea and vomiting)    Right knee meniscal tear    Varicose veins of both lower extremities with pain    Vertigo, central    Vitamin D deficiency    Wears glasses     Past Surgical History:  Procedure Laterality Date   COLONOSCOPY  12/07/2011   per Dr. Rodena Piety at [place], clear (hx of adenomatous polyps), repeat in 5 yrs    CORONARY ANGIOPLASTY WITH STENT PLACEMENT  04/09/2021   done in [place]   KNEE ARTHROSCOPY Right 08/10/2014   Procedure: ARTHROSCOPY RIGHT KNEE WITH DEBRIDEMENT;  Surgeon: Durene Romans, MD;  Location: [facility];  Service: Orthopedics;  Laterality: Right;   LAPAROSCOPIC CHOLECYSTECTOMY  Nov 2012  [place]   LAPAROSCOPIC GASTRIC SLEEVE RESECTION  11-15-2009   [place]    THYROID LOBECTOMY Left 03/23/1989   benign   TONSILLECTOMY AND ADENOIDECTOMY  age 72   VAGINAL HYSTERECTOMY  03/23/1993   w/ ovaries intact    Social History   Socioeconomic History   Marital status: Married    Spouse name: WR   Number of children: 1   Years of education: Not on file   Highest education level: Some college, no degree  Occupational History   Occupation: Field seismologist: [employer]    Comment: STD  Tobacco Use   Smoking status:  Never   Smokeless tobacco: Never  Vaping Use   Vaping Use: Never used  Substance and Sexual Activity   Alcohol use: Yes    Comment: Occasional   Drug use: No   Sexual activity: Not on file  Other Topics Concern   Not on file  Social History Narrative   Patient is right-handed. She lives with her husband in a 2 level home. She drinks 1 cup of coffee a day. She does not exercise.. she is currently on STD with her job.   Social Determinants of Health   Financial Resource Strain: Not on file  Food Insecurity: Not on file  Transportation Needs: Not on file  Physical Activity: Not on file  Stress: Not on file  Social Connections: Not on file  Intimate Partner Violence: Not on file    Family History  Problem Relation Age of Onset   Hypothyroidism Mother    Heart disease Mother    Hypertension Mother    Alcohol abuse Father    Heart disease Maternal Grandmother    Alcohol abuse Paternal Grandfather    Alcohol abuse Brother    Heart disease Brother    Heart attack Brother 65   Alcohol abuse Brother    Osteoporosis Brother    Other Brother        varicose veins   Osteoporosis Brother    Arthritis Other        family hx of   Coronary artery disease Other        family history of   Hyperlipidemia Other        family history of   Hypertension Other        family history of    ROS: Knee arthralgias but no fevers or chills, productive cough, hemoptysis, dysphasia, odynophagia,  melena, hematochezia, dysuria, hematuria, rash, seizure activity, orthopnea, PND, pedal edema, claudication. Remaining systems are negative.  Physical Exam:   Blood pressure 118/74, pulse 74, height 5\' 4"  (1.626 m), weight 251 lb (113.9 kg), SpO2 94 %.  General:  Well developed/obese in NAD Skin warm/dry Patient not depressed No peripheral clubbing Back-normal HEENT-normal/normal eyelids Neck supple/normal carotid upstroke bilaterally; no bruits; no JVD; no thyromegaly chest - CTA/ normal expansion CV - RRR/normal S1 and S2; no murmurs, rubs or gallops;  PMI nondisplaced Abdomen -NT/ND, no HSM, no mass, + bowel sounds, no bruit 2+ femoral pulses, no bruits Ext-no edema, chords, 2+ DP Neuro-grossly nonfocal  ECG -normal sinus rhythm at a rate of 77, no ST changes.  Personally reviewed  A/P  1 coronary artery disease-Continue aspirin, statin.  Discontinue Plavix.  She does not have exertional chest pain.  Occasional pressure when she feels "stressed".  Electrocardiogram shows no ST changes.  Will follow for now.  2 hypertension-blood pressure controlled.  Continue present medications and follow-up.  3 hyperlipidemia continue statin.  Given documented coronary disease will increase Crestor to 40 mg daily.  Check lipids and liver in 8 weeks.  Goal LDL less than 55.  4 history of nonsustained ventricular tachycardia-continue beta-blocker.  Will arrange echocardiogram to better assess LV function.  5 lung nodule-will arrange follow-up noncontrast chest CT.  Olga Millers, MD

## 2022-07-21 ENCOUNTER — Encounter: Payer: Self-pay | Admitting: Family Medicine

## 2022-07-22 NOTE — Telephone Encounter (Signed)
We sent the PA and mention Pt diagnosis but was denied since she is not Diabetic. Please advise

## 2022-07-23 NOTE — Telephone Encounter (Signed)
Spoke with pt this morning updated of the PA appeal

## 2022-07-23 NOTE — Telephone Encounter (Signed)
Well there's not much else we can do. She will have to pursue other options

## 2022-07-23 NOTE — Telephone Encounter (Signed)
Pt.notified

## 2022-07-23 NOTE — Telephone Encounter (Signed)
FYI Spoke with pt advised that PA was denied but PA appeal was sent, called pt insurance advised that the appeal is under review and they should sent  a determination before 07/28/22. Pt was notified and also advised to check with her insurance what other injectable they would cover for weight loss. Pt verbalized understanding

## 2022-07-27 ENCOUNTER — Ambulatory Visit (INDEPENDENT_AMBULATORY_CARE_PROVIDER_SITE_OTHER): Payer: PPO | Admitting: Cardiology

## 2022-07-27 ENCOUNTER — Ambulatory Visit (INDEPENDENT_AMBULATORY_CARE_PROVIDER_SITE_OTHER): Payer: PPO

## 2022-07-27 ENCOUNTER — Encounter: Payer: Self-pay | Admitting: Cardiology

## 2022-07-27 VITALS — BP 118/74 | HR 74 | Ht 64.0 in | Wt 251.0 lb

## 2022-07-27 DIAGNOSIS — E785 Hyperlipidemia, unspecified: Secondary | ICD-10-CM | POA: Diagnosis not present

## 2022-07-27 DIAGNOSIS — R918 Other nonspecific abnormal finding of lung field: Secondary | ICD-10-CM | POA: Diagnosis not present

## 2022-07-27 DIAGNOSIS — R911 Solitary pulmonary nodule: Secondary | ICD-10-CM | POA: Diagnosis not present

## 2022-07-27 DIAGNOSIS — I2510 Atherosclerotic heart disease of native coronary artery without angina pectoris: Secondary | ICD-10-CM

## 2022-07-27 MED ORDER — ROSUVASTATIN CALCIUM 40 MG PO TABS: 40.0000 mg | ORAL_TABLET | Freq: Every day | ORAL | 3 refills | Status: DC

## 2022-07-27 NOTE — Patient Instructions (Signed)
Medication Instructions:   STOP CLOPIDOGREL  INCREASE ROSUVASTATIN TO 40 MG ONCE DAILY= 2 OF THE 20 MG TABLET ONCE DAILY   *If you need a refill on your cardiac medications before your next appointment, please call your pharmacy*   Lab Work:  Your physician recommends that you return for lab work in: [facility]  If you have labs (blood work) drawn today and your tests are completely normal, you will receive your results only by: MyChart Message (if you have MyChart) OR A paper copy in the mail If you have any lab test that is abnormal or we need to change your treatment, we will call you to review the results.   Testing/Procedures:  Your physician has requested that you have an echocardiogram. Echocardiography is a painless test that uses sound waves to create images of your heart. It provides your doctor with information about the size and shape of your heart and how well your heart's chambers and valves are working. This procedure takes approximately one hour. There are no restrictions for this procedure. Please do NOT wear cologne, perfume, aftershave, or lotions (deodorant is allowed). Please arrive 15 minutes prior to your appointment time.    CT OF THE CHEST WO CONTRAST AT THE [facility] IMAGING DEPARTMENT   Follow-Up: At [facility], you and your health needs are our priority.  As part of our continuing mission to provide you with exceptional heart care, we have created designated Provider Care Teams.  These Care Teams include your primary Cardiologist (physician) and Advanced Practice Providers (APPs -  Physician Assistants and Nurse Practitioners) who all work together to provide you with the care you need, when you need it.  We recommend signing up for the patient portal called "MyChart".  Sign up information is provided on this After Visit Summary.  MyChart is used to connect with patients for Virtual Visits (Telemedicine).  Patients are able to  view lab/test results, encounter notes, upcoming appointments, etc.  Non-urgent messages can be sent to your provider as well.   To learn more about what you can do with MyChart, go to [email or web address].    Your next appointment:   6 month(s)  Provider:   Olga Millers, MD

## 2022-07-29 NOTE — Telephone Encounter (Signed)
That sounds great! 

## 2022-08-13 ENCOUNTER — Telehealth: Payer: Self-pay | Admitting: Cardiology

## 2022-08-13 NOTE — Telephone Encounter (Signed)
Patient called wanting to know if pre cert was obtain for her upcoming echo that is scheduled for 5/29.  She would also like to know who much she would have to pay out of pocket.

## 2022-08-19 ENCOUNTER — Ambulatory Visit (HOSPITAL_BASED_OUTPATIENT_CLINIC_OR_DEPARTMENT_OTHER)
Admission: RE | Admit: 2022-08-19 | Discharge: 2022-08-19 | Disposition: A | Discharge status: Home / Self Care | Payer: PPO | Source: Ambulatory Visit | Attending: Cardiology | Admitting: Cardiology

## 2022-08-19 DIAGNOSIS — I2510 Atherosclerotic heart disease of native coronary artery without angina pectoris: Secondary | ICD-10-CM | POA: Insufficient documentation

## 2022-08-19 LAB — ECHOCARDIOGRAM COMPLETE
AR max vel: 1.57 cm2
AV Area VTI: 1.46 cm2
AV Area mean vel: 1.44 cm2
AV Mean grad: 5 mmHg
AV Peak grad: 7.6 mmHg
Ao pk vel: 1.38 m/s
Area-P 1/2: 3.58 cm2
S' Lateral: 4 cm

## 2022-08-20 ENCOUNTER — Encounter: Payer: Self-pay | Admitting: Family Medicine

## 2022-08-25 MED ORDER — WEGOVY 1 MG/0.5ML ~~LOC~~ SOAJ: 1.0000 mg | SUBCUTANEOUS | 5 refills | Status: DC

## 2022-08-25 NOTE — Telephone Encounter (Signed)
I sent in a RX for 1 mg weekly

## 2022-08-28 ENCOUNTER — Other Ambulatory Visit: Payer: Self-pay

## 2022-08-28 ENCOUNTER — Telehealth: Payer: Self-pay

## 2022-08-28 NOTE — Telephone Encounter (Signed)
Pt PA for [facility] was sent to the plan, Pt will be notified of determination.Your information has been sent to [employer].

## 2022-08-31 NOTE — Telephone Encounter (Signed)
Pharmacy Patient Advocate Encounter  Prior Authorization for [facility] 1MG /0.5ML auto-injectors has been APPROVED by  [facility]  from 08/25/2022 to 08/24/2023.   Georga Bora Rx Patient Advocate [phone]  Message from plan: 07-JUN-24:31-DEC-24 Wegovy 1MG /0.5ML [place] SOAJ Quantity:2;

## 2022-09-01 DIAGNOSIS — B3783 Candidal cheilitis: Secondary | ICD-10-CM | POA: Diagnosis not present

## 2022-09-01 DIAGNOSIS — K130 Diseases of lips: Secondary | ICD-10-CM | POA: Diagnosis not present

## 2022-09-01 DIAGNOSIS — I781 Nevus, non-neoplastic: Secondary | ICD-10-CM | POA: Diagnosis not present

## 2022-09-01 NOTE — Telephone Encounter (Signed)
Pt notified via My Chart

## 2022-09-08 ENCOUNTER — Encounter: Payer: Self-pay | Admitting: Family Medicine

## 2022-09-21 DIAGNOSIS — M25561 Pain in right knee: Secondary | ICD-10-CM | POA: Diagnosis not present

## 2022-09-21 DIAGNOSIS — M170 Bilateral primary osteoarthritis of knee: Secondary | ICD-10-CM | POA: Diagnosis not present

## 2022-09-21 DIAGNOSIS — M25562 Pain in left knee: Secondary | ICD-10-CM | POA: Diagnosis not present

## 2022-09-21 DIAGNOSIS — M79671 Pain in right foot: Secondary | ICD-10-CM | POA: Diagnosis not present

## 2022-10-20 DIAGNOSIS — I252 Old myocardial infarction: Secondary | ICD-10-CM | POA: Diagnosis not present

## 2022-10-20 DIAGNOSIS — R911 Solitary pulmonary nodule: Secondary | ICD-10-CM | POA: Diagnosis not present

## 2022-10-20 DIAGNOSIS — K219 Gastro-esophageal reflux disease without esophagitis: Secondary | ICD-10-CM | POA: Diagnosis not present

## 2022-10-20 DIAGNOSIS — M25562 Pain in left knee: Secondary | ICD-10-CM | POA: Diagnosis not present

## 2022-10-20 DIAGNOSIS — Z6841 Body Mass Index (BMI) 40.0 and over, adult: Secondary | ICD-10-CM | POA: Diagnosis not present

## 2022-10-20 DIAGNOSIS — G4700 Insomnia, unspecified: Secondary | ICD-10-CM | POA: Diagnosis not present

## 2022-10-20 DIAGNOSIS — I214 Non-ST elevation (NSTEMI) myocardial infarction: Secondary | ICD-10-CM | POA: Diagnosis not present

## 2022-10-20 DIAGNOSIS — Z87898 Personal history of other specified conditions: Secondary | ICD-10-CM | POA: Diagnosis not present

## 2022-10-20 DIAGNOSIS — G4733 Obstructive sleep apnea (adult) (pediatric): Secondary | ICD-10-CM | POA: Diagnosis not present

## 2022-10-20 DIAGNOSIS — I4729 Other ventricular tachycardia: Secondary | ICD-10-CM | POA: Diagnosis not present

## 2022-10-20 DIAGNOSIS — E890 Postprocedural hypothyroidism: Secondary | ICD-10-CM | POA: Diagnosis not present

## 2022-10-20 DIAGNOSIS — I2510 Atherosclerotic heart disease of native coronary artery without angina pectoris: Secondary | ICD-10-CM | POA: Diagnosis not present

## 2022-10-20 DIAGNOSIS — Z01818 Encounter for other preprocedural examination: Secondary | ICD-10-CM | POA: Diagnosis not present

## 2022-10-20 DIAGNOSIS — Z789 Other specified health status: Secondary | ICD-10-CM | POA: Diagnosis not present

## 2022-10-20 DIAGNOSIS — M810 Age-related osteoporosis without current pathological fracture: Secondary | ICD-10-CM | POA: Diagnosis not present

## 2022-10-20 DIAGNOSIS — E785 Hyperlipidemia, unspecified: Secondary | ICD-10-CM | POA: Diagnosis not present

## 2022-10-20 DIAGNOSIS — M1712 Unilateral primary osteoarthritis, left knee: Secondary | ICD-10-CM | POA: Diagnosis not present

## 2022-10-20 DIAGNOSIS — H5589 Other irregular eye movements: Secondary | ICD-10-CM | POA: Diagnosis not present

## 2022-10-20 DIAGNOSIS — Z01812 Encounter for preprocedural laboratory examination: Secondary | ICD-10-CM | POA: Diagnosis not present

## 2022-10-20 DIAGNOSIS — I10 Essential (primary) hypertension: Secondary | ICD-10-CM | POA: Diagnosis not present

## 2022-10-21 ENCOUNTER — Encounter: Payer: Self-pay | Admitting: Cardiology

## 2022-10-22 ENCOUNTER — Encounter: Payer: Self-pay | Admitting: Family Medicine

## 2022-10-23 ENCOUNTER — Other Ambulatory Visit: Payer: Self-pay | Admitting: Family Medicine

## 2022-10-23 NOTE — Telephone Encounter (Signed)
I am not sure what she is referring to by "saccades". Is this the vertigo?

## 2022-10-26 ENCOUNTER — Ambulatory Visit (INDEPENDENT_AMBULATORY_CARE_PROVIDER_SITE_OTHER): Payer: PPO | Admitting: Family Medicine

## 2022-10-26 ENCOUNTER — Encounter: Payer: Self-pay | Admitting: Family Medicine

## 2022-10-26 VITALS — BP 124/76 | HR 68 | Temp 98.9°F | Wt 252.0 lb

## 2022-10-26 DIAGNOSIS — M545 Low back pain, unspecified: Secondary | ICD-10-CM

## 2022-10-26 DIAGNOSIS — R42 Dizziness and giddiness: Secondary | ICD-10-CM | POA: Diagnosis not present

## 2022-10-26 DIAGNOSIS — G8929 Other chronic pain: Secondary | ICD-10-CM

## 2022-10-26 MED ORDER — SCOPOLAMINE 1 MG/3DAYS TD PT72: 1.0000 | MEDICATED_PATCH | TRANSDERMAL | 0 refills | Status: DC

## 2022-10-26 MED ORDER — ALPRAZOLAM 1 MG PO TABS: ORAL_TABLET | ORAL | 1 refills | Status: DC

## 2022-10-26 NOTE — Telephone Encounter (Signed)
Pt had appointment with Dr Clent Ridges this afternoon, problem discussed at the visit

## 2022-10-26 NOTE — Progress Notes (Signed)
   Subjective:    Patient ID: Lori Durham, female    DOB: 28-Jul-1950, 72 y.o.   MRN: [id number]  HPI Here for several issues. First she continues to have frequent bouts of vertigo. She has tried vestibular rehab and Meclizine in the past with poor results. Also one week ago she developed a sharp pain in the left lower back that sometimes radiates to the left flank. No urinary symptoms. Of note she saw Dr. Sharlet Salina Ditty at [facility] 6 years ago for low back pain, and an MRI revealed degenerative discs and advanced facet arthropathy. She is scheduled for a left total knee arthroplasty on 11-03-22, and then they plan to do the right side after that.    Review of Systems  Constitutional: Negative.   Respiratory: Negative.    Cardiovascular: Negative.   Gastrointestinal: Negative.   Genitourinary: Negative.   Musculoskeletal:  Positive for arthralgias and back pain.       Objective:   Physical Exam Constitutional:      Appearance: She is obese.     Comments: She walks a limp   Eyes:     Pupils: Pupils are equal, round, and reactive to light.  Cardiovascular:     Rate and Rhythm: Normal rate and regular rhythm.     Pulses: Normal pulses.     Heart sounds: Normal heart sounds.  Pulmonary:     Effort: Pulmonary effort is normal.     Breath sounds: Normal breath sounds.  Musculoskeletal:     Comments: She is very tender over the lower spine and along the left side of the lumbar spine. ROM is reduced by pain   Neurological:     Mental Status: She is alert and oriented to person, place, and time. Mental status is at baseline.           Assessment & Plan:  For her back pain, she can take ES Tylenol as needed. We will refer her back to Dr. Bevely Palmer. For the vertigo, she will try transdermal scopolamine patches every 3 days.  Gershon Crane, MD

## 2022-11-03 DIAGNOSIS — Z91041 Radiographic dye allergy status: Secondary | ICD-10-CM | POA: Diagnosis not present

## 2022-11-03 DIAGNOSIS — Z881 Allergy status to other antibiotic agents status: Secondary | ICD-10-CM | POA: Diagnosis not present

## 2022-11-03 DIAGNOSIS — Z96652 Presence of left artificial knee joint: Secondary | ICD-10-CM | POA: Diagnosis not present

## 2022-11-03 DIAGNOSIS — I252 Old myocardial infarction: Secondary | ICD-10-CM | POA: Diagnosis not present

## 2022-11-03 DIAGNOSIS — Z9884 Bariatric surgery status: Secondary | ICD-10-CM | POA: Diagnosis not present

## 2022-11-03 DIAGNOSIS — Z885 Allergy status to narcotic agent status: Secondary | ICD-10-CM | POA: Diagnosis not present

## 2022-11-03 DIAGNOSIS — Z880 Allergy status to penicillin: Secondary | ICD-10-CM | POA: Diagnosis not present

## 2022-11-03 DIAGNOSIS — R569 Unspecified convulsions: Secondary | ICD-10-CM | POA: Diagnosis not present

## 2022-11-03 DIAGNOSIS — G4733 Obstructive sleep apnea (adult) (pediatric): Secondary | ICD-10-CM | POA: Diagnosis not present

## 2022-11-03 DIAGNOSIS — M1712 Unilateral primary osteoarthritis, left knee: Secondary | ICD-10-CM | POA: Diagnosis not present

## 2022-11-03 DIAGNOSIS — E785 Hyperlipidemia, unspecified: Secondary | ICD-10-CM | POA: Diagnosis not present

## 2022-11-03 DIAGNOSIS — Z79899 Other long term (current) drug therapy: Secondary | ICD-10-CM | POA: Diagnosis not present

## 2022-11-03 DIAGNOSIS — Z955 Presence of coronary angioplasty implant and graft: Secondary | ICD-10-CM | POA: Diagnosis not present

## 2022-11-03 DIAGNOSIS — I10 Essential (primary) hypertension: Secondary | ICD-10-CM | POA: Diagnosis not present

## 2022-11-03 DIAGNOSIS — E039 Hypothyroidism, unspecified: Secondary | ICD-10-CM | POA: Diagnosis not present

## 2022-11-03 DIAGNOSIS — Z91040 Latex allergy status: Secondary | ICD-10-CM | POA: Diagnosis not present

## 2022-11-03 DIAGNOSIS — Z6841 Body Mass Index (BMI) 40.0 and over, adult: Secondary | ICD-10-CM | POA: Diagnosis not present

## 2022-11-03 DIAGNOSIS — M170 Bilateral primary osteoarthritis of knee: Secondary | ICD-10-CM | POA: Diagnosis not present

## 2022-11-03 DIAGNOSIS — Z7982 Long term (current) use of aspirin: Secondary | ICD-10-CM | POA: Diagnosis not present

## 2022-11-03 DIAGNOSIS — Z471 Aftercare following joint replacement surgery: Secondary | ICD-10-CM | POA: Diagnosis not present

## 2022-11-03 DIAGNOSIS — Z79890 Hormone replacement therapy (postmenopausal): Secondary | ICD-10-CM | POA: Diagnosis not present

## 2022-11-03 DIAGNOSIS — I2510 Atherosclerotic heart disease of native coronary artery without angina pectoris: Secondary | ICD-10-CM | POA: Diagnosis not present

## 2022-11-03 DIAGNOSIS — Z888 Allergy status to other drugs, medicaments and biological substances status: Secondary | ICD-10-CM | POA: Diagnosis not present

## 2022-11-03 DIAGNOSIS — K219 Gastro-esophageal reflux disease without esophagitis: Secondary | ICD-10-CM | POA: Diagnosis not present

## 2022-11-05 DIAGNOSIS — M1711 Unilateral primary osteoarthritis, right knee: Secondary | ICD-10-CM | POA: Diagnosis not present

## 2022-11-05 DIAGNOSIS — I2510 Atherosclerotic heart disease of native coronary artery without angina pectoris: Secondary | ICD-10-CM | POA: Diagnosis not present

## 2022-11-05 DIAGNOSIS — Z7982 Long term (current) use of aspirin: Secondary | ICD-10-CM | POA: Diagnosis not present

## 2022-11-05 DIAGNOSIS — R569 Unspecified convulsions: Secondary | ICD-10-CM | POA: Diagnosis not present

## 2022-11-05 DIAGNOSIS — M810 Age-related osteoporosis without current pathological fracture: Secondary | ICD-10-CM | POA: Diagnosis not present

## 2022-11-05 DIAGNOSIS — I10 Essential (primary) hypertension: Secondary | ICD-10-CM | POA: Diagnosis not present

## 2022-11-05 DIAGNOSIS — I252 Old myocardial infarction: Secondary | ICD-10-CM | POA: Diagnosis not present

## 2022-11-05 DIAGNOSIS — Z5982 Transportation insecurity: Secondary | ICD-10-CM | POA: Diagnosis not present

## 2022-11-05 DIAGNOSIS — Z471 Aftercare following joint replacement surgery: Secondary | ICD-10-CM | POA: Diagnosis not present

## 2022-11-05 DIAGNOSIS — Z96652 Presence of left artificial knee joint: Secondary | ICD-10-CM | POA: Diagnosis not present

## 2022-11-24 ENCOUNTER — Encounter: Payer: Self-pay | Admitting: Family Medicine

## 2022-11-26 ENCOUNTER — Encounter: Payer: PPO | Admitting: Family Medicine

## 2022-11-26 NOTE — Telephone Encounter (Signed)
Instead of protein shakes try drinking 2-3 bottles of Ensure daily

## 2022-12-15 DIAGNOSIS — M25562 Pain in left knee: Secondary | ICD-10-CM | POA: Diagnosis not present

## 2022-12-25 DIAGNOSIS — M25551 Pain in right hip: Secondary | ICD-10-CM | POA: Diagnosis not present

## 2022-12-25 DIAGNOSIS — M1711 Unilateral primary osteoarthritis, right knee: Secondary | ICD-10-CM | POA: Diagnosis not present

## 2022-12-28 DIAGNOSIS — M25562 Pain in left knee: Secondary | ICD-10-CM | POA: Diagnosis not present

## 2022-12-28 DIAGNOSIS — R2689 Other abnormalities of gait and mobility: Secondary | ICD-10-CM | POA: Diagnosis not present

## 2022-12-31 DIAGNOSIS — R6889 Other general symptoms and signs: Secondary | ICD-10-CM | POA: Diagnosis not present

## 2022-12-31 DIAGNOSIS — M1712 Unilateral primary osteoarthritis, left knee: Secondary | ICD-10-CM | POA: Diagnosis not present

## 2023-01-12 DIAGNOSIS — R6889 Other general symptoms and signs: Secondary | ICD-10-CM | POA: Diagnosis not present

## 2023-01-12 DIAGNOSIS — M1712 Unilateral primary osteoarthritis, left knee: Secondary | ICD-10-CM | POA: Diagnosis not present

## 2023-01-25 NOTE — Progress Notes (Signed)
HPI: FU coronary artery disease.  Patient had non-ST elevation myocardial infarction January 2023 ([place]).  CTA January 2023 showed no aortic dissection or aneurysm; note of 4 mm lung nodule and follow-up recommended 1 year if not low risk.  Cardiac catheterization January 2023 showed 90% LAD, 50% diagonal, occluded RCA; patient had PCI of the LAD with 2 drug-eluting stents.  Monitor January 2023 showed 30 seconds nonsustained ventricular tachycardia.  Nuclear study August 2023 showed ejection fraction 62%, diaphragmatic attenuation but no ischemia.  Echocardiogram May 2024 showed normal LV function, grade 1 diastolic dysfunction.  Follow-up chest CT May 2024 showed unchanged 4 mm nodule in the lateral right lower lobe.  Since last seen, she denies increased dyspnea, chest pain, palpitations or syncope.  Current Outpatient Medications  Medication Sig Dispense Refill   acyclovir (ZOVIRAX) 800 MG tablet Take 1 tablet (800 mg total) by mouth 3 (three) times daily. 60 tablet 2   acyclovir ointment (ZOVIRAX) 5 % Apply 1 Application topically every 3 (three) hours. 15 g 5   albuterol (VENTOLIN HFA) 108 (90 Base) MCG/ACT inhaler INHALE 2 PUFFS INTO THE LUNGS EVERY 4 (FOUR) HOURS AS NEEDED FOR WHEEZING OR SHORTNESS OF BREATH. 18 g 2   ALPRAZolam (XANAX) 1 MG tablet TAKE 1 AND 1/2 TABLET BY MOUTH DAILY AS NEEDED 135 tablet 1   amLODipine (NORVASC) 2.5 MG tablet Take 2.5 mg by mouth daily.     aspirin EC 81 MG tablet Take 1 tablet by mouth daily.     benzonatate (TESSALON) 200 MG capsule Take 1 capsule (200 mg total) by mouth every 6 (six) hours as needed for cough. 60 capsule 1   BIOTIN PO Take 1 tablet by mouth daily. Twice a week     Cholecalciferol (VITAMIN D3) 1000 UNITS CAPS Take 1 capsule by mouth daily. Twice per week     Coenzyme Q10 (COQ-10 PO) Take by mouth every other day.     fish oil-omega-3 fatty acids 1000 MG capsule Take 2 g by mouth daily.     fluconazole (DIFLUCAN) 150 MG tablet  TAKE 1 TABLET BY MOUTH AS ONE DOSE 2 tablet 5   Glucosamine-Chondroit-Vit C-Mn (GLUCOSAMINE 1500 COMPLEX PO) Take 2 tablets by mouth daily.     ketoconazole (NIZORAL) 2 % cream Apply 1 application topically 2 (two) times daily. 60 g 2   levETIRAcetam (KEPPRA) 750 MG tablet Take 1 tablet (750 mg total) by mouth 2 (two) times daily. 180 tablet 3   liothyronine (CYTOMEL) 5 MCG tablet Take 1 tablet (5 mcg total) by mouth daily. 90 tablet 3   MAGNESIUM-ZINC PO Take by mouth.     metoprolol tartrate (LOPRESSOR) 25 MG tablet Take 25 mg by mouth 2 (two) times daily.     Multiple Vitamin (MULTIVITAMIN) tablet Take 1 tablet by mouth daily.     nystatin-triamcinolone (MYCOLOG II) cream      pantoprazole (PROTONIX) 40 MG tablet Take 1 tablet (40 mg total) by mouth daily. 30 tablet 3   Probiotic Product (ALIGN PO) Take by mouth daily.     Probiotic Product (ALIGN) 4 MG CAPS Take 1 capsule by mouth daily.     raloxifene (EVISTA) 60 MG tablet Take 1 tablet (60 mg total) by mouth daily. 90 tablet 3   rosuvastatin (CRESTOR) 40 MG tablet Take 1 tablet (40 mg total) by mouth daily. 90 tablet 3   SYNTHROID 137 MCG tablet Take 1 tablet (137 mcg total) by mouth daily before breakfast. 90  tablet 3   vitamin B-12 (CYANOCOBALAMIN) 1000 MCG tablet Take 1,000 mcg by mouth daily.     scopolamine (TRANSDERM-SCOP) 1 MG/3DAYS Place 1 patch (1.5 mg total) onto the skin every 3 (three) days. 10 patch 0   Semaglutide-Weight Management (WEGOVY) 1 MG/0.5ML SOAJ Inject 1 mg into the skin once a week. 2 mL 5   No current facility-administered medications for this visit.     Past Medical History:  Diagnosis Date   Asthma, mild intermittent    CAD (coronary artery disease) 04/09/2021   she had a NSTEMI, 2 stents were placed   GERD (gastroesophageal reflux disease)    watches diet   History of colon polyps    History of diabetes mellitus    no issue since gastric sleeve surgery and lost wt.   History of exercise stress test     ETT--  06-17-2007--  NORMAL   History of hypertension    no issue since gastric sleeve surgery and lost wt.   Hyperlipidemia    Hypothyroidism, postsurgical    OSA (obstructive sleep apnea)    MODERATE OSA PER STUDY 09-25-2009--- no longer uses cpap,  no issue since gastric sleeve surgery and lost wt.   Osteoporosis    last DEXA on 11-20-10 was normal    PONV (postoperative nausea and vomiting)    Right knee meniscal tear    Varicose veins of both lower extremities with pain    Vertigo, central    Vitamin D deficiency    Wears glasses     Past Surgical History:  Procedure Laterality Date   COLONOSCOPY  12/07/2011   per Dr. Rodena Piety at [place], clear (hx of adenomatous polyps), repeat in 5 yrs    CORONARY ANGIOPLASTY WITH STENT PLACEMENT  04/09/2021   done in [place]   KNEE ARTHROSCOPY Right 08/10/2014   Procedure: ARTHROSCOPY RIGHT KNEE WITH DEBRIDEMENT;  Surgeon: Durene Romans, MD;  Location: [facility];  Service: Orthopedics;  Laterality: Right;   LAPAROSCOPIC CHOLECYSTECTOMY  Nov 2012  [place]   LAPAROSCOPIC GASTRIC SLEEVE RESECTION  11-15-2009   [place]   THYROID LOBECTOMY Left 03/23/1989   benign   TONSILLECTOMY AND ADENOIDECTOMY  age 50   VAGINAL HYSTERECTOMY  03/23/1993   w/ ovaries intact    Social History   Socioeconomic History   Marital status: Married    Spouse name: WR   Number of children: 1   Years of education: Not on file   Highest education level: Some college, no degree  Occupational History   Occupation: Field seismologist: Herbalist  AND  NURSERY    Comment: [employer]  Tobacco Use   Smoking status: Never   Smokeless tobacco: Never  Vaping Use   Vaping status: Never Used  Substance and Sexual Activity   Alcohol use: Yes    Comment: Occasional   Drug use: No   Sexual activity: Not on file  Other Topics Concern   Not on file  Social History Narrative   Patient is right-handed. She  lives with her husband in a 2 level home. She drinks 1 cup of coffee a day. She does not exercise.. she is currently on STD with her job.   Social Determinants of Health   Financial Resource Strain: Not on file  Food Insecurity: No Food Insecurity (11/03/2022)   Received from [facility]   Hunger Vital Sign    Worried About Running Out of Food in  the Last Year: Never true    Ran Out of Food in the Last Year: Never true  Transportation Needs: No Transportation Needs (11/03/2022)   [facility] - Transportation    Lack of Transportation (Medical): No    Lack of Transportation (Non-Medical): No  Physical Activity: Not on file  Stress: No Stress Concern Present (11/03/2022)   Received from [facility] - Occupational Stress Questionnaire    Feeling of Stress : Not at all  Social Connections: Unknown (11/03/2022)   Received from [facility]   Social Network    Social Network: Not on file  Intimate Partner Violence: Not At Risk (11/03/2022)   Received from [facility]   HITS    Over the last 12 months how often did your partner physically hurt you?: Never    Over the last 12 months how often did your partner insult you or talk down to you?: Never    Over the last 12 months how often did your partner threaten you with physical harm?: Never    Over the last 12 months how often did your partner scream or curse at you?: Never    Family History  Problem Relation Age of Onset   Hypothyroidism Mother    Heart disease Mother    Hypertension Mother    Alcohol abuse Father    Heart disease Maternal Grandmother    Alcohol abuse Paternal Grandfather    Alcohol abuse Brother    Heart disease Brother    Heart attack Brother 73   Alcohol abuse Brother    Osteoporosis Brother    Other Brother        varicose veins   Osteoporosis Brother    Arthritis Other        family hx of   Coronary artery disease Other         family history of   Hyperlipidemia Other        family history of   Hypertension Other        family history of    ROS: Residual pain in the knee from recent knee replacement but no fevers or chills, productive cough, hemoptysis, dysphasia, odynophagia, melena, hematochezia, dysuria, hematuria, rash, seizure activity, orthopnea, PND, pedal edema, claudication. Remaining systems are negative.  Physical Exam: Well-developed well-nourished in no acute distress.  Skin is warm and dry.  HEENT is normal.  Neck is supple.  Chest is clear to auscultation with normal expansion.  Cardiovascular exam is regular rate and rhythm.  Abdominal exam nontender or distended. No masses palpated. Extremities show no edema. neuro grossly intact   A/P  1 coronary artery disease-continue medical therapy with aspirin and statin.  She is not having chest pain.  2 hyperlipidemia-continue statin.  LDL not at goal on most recent lipid panel.  Add Zetia 10 mg daily.  Check lipids and liver in 8 to 12 weeks.  3 hypertension-blood pressure is controlled.  However she is on minimal dose of amlodipine and this is felt potentially to be contributing to gum disease.  Will discontinue and follow blood pressure.  4 history of nonsustained VT-continue beta-blocker.  LV function is normal.  5 history of lung nodule-follow-up CT showed unchanged size and no further imaging recommended.  Olga Millers, MD

## 2023-02-08 ENCOUNTER — Ambulatory Visit: Payer: PPO | Admitting: Cardiology

## 2023-02-08 ENCOUNTER — Encounter: Payer: Self-pay | Admitting: Cardiology

## 2023-02-08 VITALS — BP 127/82 | HR 73 | Ht 64.0 in | Wt 238.8 lb

## 2023-02-08 DIAGNOSIS — I10 Essential (primary) hypertension: Secondary | ICD-10-CM

## 2023-02-08 DIAGNOSIS — I2510 Atherosclerotic heart disease of native coronary artery without angina pectoris: Secondary | ICD-10-CM

## 2023-02-08 DIAGNOSIS — E785 Hyperlipidemia, unspecified: Secondary | ICD-10-CM

## 2023-02-08 MED ORDER — EZETIMIBE 10 MG PO TABS: 10.0000 mg | ORAL_TABLET | Freq: Every day | ORAL | 3 refills | Status: DC

## 2023-02-08 NOTE — Patient Instructions (Signed)
Medication Instructions:   STOP AMLODIPINE  START EZETIMIBE 10 MG ONCE DAILY  *If you need a refill on your cardiac medications before your next appointment, please call your pharmacy*   Lab Work:  LIPID PANEL AND HEPATIC PANEL WITH MEDICAL DOCTOR   If you have labs (blood work) drawn today and your tests are completely normal, you will receive your results only by: MyChart Message (if you have MyChart) OR A paper copy in the mail If you have any lab test that is abnormal or we need to change your treatment, we will call you to review the results.   Follow-Up: At [facility], you and your health needs are our priority.  As part of our continuing mission to provide you with exceptional heart care, we have created designated Provider Care Teams.  These Care Teams include your primary Cardiologist (physician) and Advanced Practice Providers (APPs -  Physician Assistants and Nurse Practitioners) who all work together to provide you with the care you need, when you need it.  We recommend signing up for the patient portal called "MyChart".  Sign up information is provided on this After Visit Summary.  MyChart is used to connect with patients for Virtual Visits (Telemedicine).  Patients are able to view lab/test results, encounter notes, upcoming appointments, etc.  Non-urgent messages can be sent to your provider as well.   To learn more about what you can do with MyChart, go to [email or web address].    Your next appointment:   12 month(s)  Provider:   Olga Millers, MD

## 2023-02-24 NOTE — Progress Notes (Unsigned)
   ANNUAL EXAM Patient name: Lori Durham MRN [id number]  Date of birth: October 30, 1950 Chief Complaint:   No chief complaint on file.  History of Present Illness:   Lori Durham is a 72 y.o. No obstetric history on file. female being seen today for a routine annual exam.   Current concerns: ***   No LMP recorded. Patient has had a hysterectomy.   Last MXR: 2018? *** Last Pap/Pap History:  2012: pap wnl 2016: pap wnl TVH 1995 at age 70. Has ovaries   Health Maintenance Due  Topic Date Due   Hepatitis C Screening  Never done   DTaP/Tdap/Td (1 - Tdap) Never done   Pneumonia Vaccine 51+ Years old (2 of 2 - PCV) 08/14/2018   MAMMOGRAM  10/09/2019   Colonoscopy  12/06/2021   INFLUENZA VACCINE  10/22/2022   COVID-19 Vaccine (4 - 2023-24 season) 11/22/2022   Medicare Annual Wellness (AWV)  02/28/2023    Review of Systems:   Pertinent items are noted in HPI Denies any headaches, blurred vision, fatigue, shortness of breath, chest pain, abdominal pain, abnormal vaginal discharge/itching/odor/irritation, bowel movements, urination, or intercourse unless otherwise stated above. *** Pertinent History Reviewed:  Reviewed past medical,surgical, social and family history.  Reviewed problem list, medications and allergies. Physical Assessment:  There were no vitals filed for this visit.There is no height or weight on file to calculate BMI.   Physical Examination:  General appearance - well appearing, and in no distress Mental status - alert, oriented to person, place, and time Psych:  She has a normal mood and affect Skin - warm and dry, normal color, no suspicious lesions noted Chest - effort normal Heart - normal rate  Breasts - breasts appear normal, no suspicious masses, no skin or nipple changes or axillary nodes Abdomen - soft, nontender, nondistended, no masses or organomegaly Pelvic -  VULVA: normal appearing vulva with no masses, tenderness or lesions  VAGINA: normal  appearing vagina with normal color and discharge, no lesions  CERVIX: normal appearing cervix without discharge or lesions, no CMT UTERUS: uterus is felt to be normal size, shape, consistency and nontender  ADNEXA: No adnexal masses or tenderness noted. Extremities:  No swelling or varicosities noted  Chaperone present for exam  No results found for this or any previous visit (from the past 24 hour(s)).  Assessment & Plan:  Diagnoses and all orders for this visit:  Encounter for annual routine gynecological examination      No orders of the defined types were placed in this encounter.   Meds: No orders of the defined types were placed in this encounter.   Follow-up: No follow-ups on file.  Milas Hock, MD 02/24/2023 1:19 PM

## 2023-02-25 ENCOUNTER — Other Ambulatory Visit (HOSPITAL_COMMUNITY)
Admission: RE | Admit: 2023-02-25 | Discharge: 2023-02-25 | Disposition: A | Discharge status: Home / Self Care | Payer: PPO | Source: Ambulatory Visit | Attending: Obstetrics and Gynecology | Admitting: Obstetrics and Gynecology

## 2023-02-25 ENCOUNTER — Ambulatory Visit (INDEPENDENT_AMBULATORY_CARE_PROVIDER_SITE_OTHER): Payer: PPO | Admitting: Obstetrics and Gynecology

## 2023-02-25 ENCOUNTER — Encounter: Payer: Self-pay | Admitting: Obstetrics and Gynecology

## 2023-02-25 VITALS — BP 130/78 | HR 65 | Ht 64.0 in | Wt 238.0 lb

## 2023-02-25 DIAGNOSIS — Z01411 Encounter for gynecological examination (general) (routine) with abnormal findings: Secondary | ICD-10-CM

## 2023-02-25 DIAGNOSIS — B3731 Acute candidiasis of vulva and vagina: Secondary | ICD-10-CM

## 2023-02-25 DIAGNOSIS — Z01419 Encounter for gynecological examination (general) (routine) without abnormal findings: Secondary | ICD-10-CM

## 2023-02-25 MED ORDER — FLUCONAZOLE 150 MG PO TABS: 150.0000 mg | ORAL_TABLET | ORAL | 3 refills | Status: DC

## 2023-02-25 MED ORDER — NYSTATIN-TRIAMCINOLONE 100000-0.1 UNIT/GM-% EX OINT
1.0000 | TOPICAL_OINTMENT | Freq: Two times a day (BID) | CUTANEOUS | 1 refills | Status: AC
Start: 2023-02-25 — End: ?

## 2023-02-25 NOTE — Addendum Note (Signed)
Addended by: Kathie Dike on: 02/25/2023 02:25 PM   Modules accepted: Orders

## 2023-02-26 LAB — CERVICOVAGINAL ANCILLARY ONLY
Bacterial Vaginitis (gardnerella): NEGATIVE
Candida Glabrata: NEGATIVE
Candida Vaginitis: NEGATIVE
Comment: NEGATIVE
Comment: NEGATIVE
Comment: NEGATIVE

## 2023-03-11 ENCOUNTER — Ambulatory Visit: Payer: Self-pay | Admitting: Family Medicine

## 2023-03-11 NOTE — Telephone Encounter (Signed)
  Chief Complaint: lip swelling Symptoms: top and bottom lip swelling Frequency: started yesterday and worse today Pertinent Negatives: Patient denies tongue swelling or SOB Disposition: [] ED /[] Urgent Care (no appt availability in office) / [x] Appointment(In office/virtual)/ []  [facility] [facility]/ [] Home Care/ [] Refused Recommended Disposition /[] [place] [facility]/ []  Follow-up with PCP Additional Notes: pt states unknown reason for lip swelling: never has happened before: Nurse gave home care advice and scheduled in office appt for 12/202/2024: patient verbalized understanding Reason for Disposition  [1] Mild lip swelling from food reaction AND [2] diagnosis never confirmed by a doctor (or NP/PA)  Answer Assessment - Initial Assessment Questions 1. ONSET: "When did the swelling start?" (e.g., minutes, hours, days)     yesterday 2. SEVERITY: "How swollen is it?"     moderate 3. ITCHING: "Is there any itching?" If Yes, ask: "How much?"   (Scale 1-10; mild, moderate or severe)     no 4. PAIN: "Is the swelling painful to touch?" If Yes, ask: "How painful is it?"   (Scale 1-10; mild, moderate or severe)     no 5. CAUSE: "What do you think is causing the lip swelling?"     unknown 6. RECURRENT SYMPTOM: "Have you had lip swelling before?" If Yes, ask: "When was the last time?" "What happened that time?"     no 7. OTHER SYMPTOMS: "Do you have any other symptoms?" (e.g., toothache)     no 8. PREGNANCY: "Is there any chance you are pregnant?" "When was your last menstrual period?"     no  Protocols used: Lip Swelling-A-AH

## 2023-03-11 NOTE — Telephone Encounter (Signed)
She has an appt for tomorrow morning

## 2023-03-11 NOTE — Telephone Encounter (Signed)
Duplicate message. 

## 2023-03-11 NOTE — Telephone Encounter (Deleted)
  Reason for Disposition . [1] Mild lip swelling from food reaction AND [2] diagnosis never confirmed by a doctor (or NP/PA)  Answer Assessment - Initial Assessment Questions 1. ONSET: "When did the swelling start?" (e.g., minutes, hours, days)     yesterday 2. SEVERITY: "How swollen is it?"     moderate 3. ITCHING: "Is there any itching?" If Yes, ask: "How much?"   (Scale 1-10; mild, moderate or severe)     no 4. PAIN: "Is the swelling painful to touch?" If Yes, ask: "How painful is it?"   (Scale 1-10; mild, moderate or severe)     no 5. CAUSE: "What do you think is causing the lip swelling?"     unknown 6. RECURRENT SYMPTOM: "Have you had lip swelling before?" If Yes, ask: "When was the last time?" "What happened that time?"     no 7. OTHER SYMPTOMS: "Do you have any other symptoms?" (e.g., toothache)     no 8. PREGNANCY: "Is there any chance you are pregnant?" "When was your last menstrual period?"     no  Protocols used: Lip Swelling-A-AH

## 2023-03-11 NOTE — Telephone Encounter (Signed)
Copied from CRM [id number]. Topic: Appointments - Scheduling Inquiry for Clinic >> Mar 11, 2023  8:43 AM Fonda Kinder J wrote: Reason for CRM: Pt is experiencing an allergic reaction in her lips and did not want to be triaged and doesn't want to wait for the next available appointment. Pt wants to be notified if an opening becomes available today before 3pm & pt is also requesting a call back to get advice on the allergic reaction  Please advise

## 2023-03-12 ENCOUNTER — Ambulatory Visit: Payer: PPO | Admitting: Family Medicine

## 2023-04-18 ENCOUNTER — Other Ambulatory Visit: Payer: Self-pay | Admitting: Family Medicine

## 2023-04-30 ENCOUNTER — Encounter: Payer: PPO | Admitting: Family Medicine

## 2023-04-30 ENCOUNTER — Other Ambulatory Visit: Payer: Self-pay | Admitting: Family Medicine

## 2023-05-07 ENCOUNTER — Encounter: Payer: PPO | Admitting: Family Medicine

## 2023-05-11 ENCOUNTER — Encounter: Payer: Self-pay | Admitting: Family Medicine

## 2023-05-11 ENCOUNTER — Ambulatory Visit (INDEPENDENT_AMBULATORY_CARE_PROVIDER_SITE_OTHER): Payer: PPO | Admitting: Family Medicine

## 2023-05-11 VITALS — BP 118/64 | HR 71 | Temp 98.3°F | Ht 64.0 in | Wt 238.0 lb

## 2023-05-11 DIAGNOSIS — Z0000 Encounter for general adult medical examination without abnormal findings: Secondary | ICD-10-CM

## 2023-05-11 DIAGNOSIS — E890 Postprocedural hypothyroidism: Secondary | ICD-10-CM | POA: Diagnosis not present

## 2023-05-11 DIAGNOSIS — H5581 Saccadic eye movements: Secondary | ICD-10-CM | POA: Insufficient documentation

## 2023-05-11 LAB — CBC WITH DIFFERENTIAL/PLATELET
Basophils Absolute: 0 10*3/uL (ref 0.0–0.1)
Basophils Relative: 0.5 % (ref 0.0–3.0)
Eosinophils Absolute: 0.3 10*3/uL (ref 0.0–0.7)
Eosinophils Relative: 4.2 % (ref 0.0–5.0)
HCT: 42.6 % (ref 36.0–46.0)
Hemoglobin: 14.7 g/dL (ref 12.0–15.0)
Lymphocytes Relative: 26.1 % (ref 12.0–46.0)
Lymphs Abs: 2.1 10*3/uL (ref 0.7–4.0)
MCHC: 34.5 g/dL (ref 30.0–36.0)
MCV: 102.5 fL — ABNORMAL HIGH (ref 78.0–100.0)
Monocytes Absolute: 0.5 10*3/uL (ref 0.1–1.0)
Monocytes Relative: 5.7 % (ref 3.0–12.0)
Neutro Abs: 5 10*3/uL (ref 1.4–7.7)
Neutrophils Relative %: 63.5 % (ref 43.0–77.0)
Platelets: 251 10*3/uL (ref 150.0–400.0)
RBC: 4.15 Mil/uL (ref 3.87–5.11)
RDW: 11.9 % (ref 11.5–15.5)
WBC: 7.9 10*3/uL (ref 4.0–10.5)

## 2023-05-11 LAB — TSH: TSH: 0.03 u[IU]/mL — ABNORMAL LOW (ref 0.35–5.50)

## 2023-05-11 LAB — LIPID PANEL
Cholesterol: 137 mg/dL (ref 0–200)
HDL: 59.5 mg/dL (ref >39.00)
LDL Cholesterol: 48 mg/dL (ref 0–99)
NonHDL: 77.9
Total CHOL/HDL Ratio: 2
Triglycerides: 152 mg/dL — ABNORMAL HIGH (ref 0.0–149.0)
VLDL: 30.4 mg/dL (ref 0.0–40.0)

## 2023-05-11 LAB — BASIC METABOLIC PANEL
BUN: 13 mg/dL (ref 6–23)
CO2: 29 meq/L (ref 19–32)
Calcium: 9.4 mg/dL (ref 8.4–10.5)
Chloride: 103 meq/L (ref 96–112)
Creatinine, Ser: 0.7 mg/dL (ref 0.40–1.20)
GFR: 87.67 mL/min (ref >60.00)
Glucose, Bld: 111 mg/dL — ABNORMAL HIGH (ref 70–99)
Potassium: 4.3 meq/L (ref 3.5–5.1)
Sodium: 141 meq/L (ref 135–145)

## 2023-05-11 LAB — HEPATIC FUNCTION PANEL
ALT: 22 U/L (ref 0–35)
AST: 22 U/L (ref 0–37)
Albumin: 4.2 g/dL (ref 3.5–5.2)
Alkaline Phosphatase: 78 U/L (ref 39–117)
Bilirubin, Direct: 0.2 mg/dL (ref 0.0–0.3)
Total Bilirubin: 1 mg/dL (ref 0.2–1.2)
Total Protein: 7.2 g/dL (ref 6.0–8.3)

## 2023-05-11 LAB — T4, FREE: Free T4: 1.14 ng/dL (ref 0.60–1.60)

## 2023-05-11 LAB — HEMOGLOBIN A1C: Hgb A1c MFr Bld: 5.7 % (ref 4.6–6.5)

## 2023-05-11 LAB — T3, FREE: T3, Free: 3.1 pg/mL (ref 2.3–4.2)

## 2023-05-11 MED ORDER — PANTOPRAZOLE SODIUM 40 MG PO TBEC: 40.0000 mg | DELAYED_RELEASE_TABLET | Freq: Every day | ORAL | 3 refills | Status: AC

## 2023-05-11 MED ORDER — ACYCLOVIR 800 MG PO TABS: 800.0000 mg | ORAL_TABLET | Freq: Three times a day (TID) | ORAL | 2 refills | Status: AC

## 2023-05-11 MED ORDER — ALBUTEROL SULFATE HFA 108 (90 BASE) MCG/ACT IN AERS: 2.0000 | INHALATION_SPRAY | RESPIRATORY_TRACT | 2 refills | Status: AC | PRN

## 2023-05-11 MED ORDER — ROSUVASTATIN CALCIUM 40 MG PO TABS: 40.0000 mg | ORAL_TABLET | Freq: Every day | ORAL | 3 refills | Status: AC

## 2023-05-11 MED ORDER — SYNTHROID 137 MCG PO TABS: 137.0000 ug | ORAL_TABLET | Freq: Every day | ORAL | 3 refills | Status: AC

## 2023-05-11 NOTE — Progress Notes (Signed)
 Subjective:    Patient ID: Lori Durham, female    DOB: 1950/04/25, 73 y.o.   MRN: [id number]  HPI Here for a well exam. She has a few issues to discuss. First she continues to be troubled with periodic saccadic eye movements that make her feel dizzy and unsteady. She would like to see someone localy that can help her with this. Also she stil struggles with her weight. She asks what she can do other than watch her diet.    Review of Systems  Constitutional: Negative.   HENT: Negative.    Eyes: Negative.   Respiratory: Negative.    Cardiovascular: Negative.   Gastrointestinal: Negative.   Genitourinary:  Negative for decreased urine volume, difficulty urinating, dyspareunia, dysuria, enuresis, flank pain, frequency, hematuria, pelvic pain and urgency.  Musculoskeletal: Negative.   Skin: Negative.   Neurological:  Positive for dizziness. Negative for headaches.  Psychiatric/Behavioral: Negative.         Objective:   Physical Exam Constitutional:      General: She is not in acute distress.    Appearance: She is well-developed. She is obese.  HENT:     Head: Normocephalic and atraumatic.     Right Ear: External ear normal.     Left Ear: External ear normal.     Nose: Nose normal.     Mouth/Throat:     Pharynx: No oropharyngeal exudate.  Eyes:     General: No scleral icterus.    Conjunctiva/sclera: Conjunctivae normal.     Pupils: Pupils are equal, round, and reactive to light.  Neck:     Thyroid: No thyromegaly.     Vascular: No JVD.  Cardiovascular:     Rate and Rhythm: Normal rate and regular rhythm.     Pulses: Normal pulses.     Heart sounds: Normal heart sounds. No murmur heard.    No friction rub. No gallop.  Pulmonary:     Effort: Pulmonary effort is normal. No respiratory distress.     Breath sounds: Normal breath sounds. No wheezing or rales.  Chest:     Chest wall: No tenderness.  Abdominal:     General: Bowel sounds are normal. There is no distension.      Palpations: Abdomen is soft. There is no mass.     Tenderness: There is no abdominal tenderness. There is no guarding or rebound.  Musculoskeletal:        General: No tenderness. Normal range of motion.     Cervical back: Normal range of motion and neck supple.  Lymphadenopathy:     Cervical: No cervical adenopathy.  Skin:    General: Skin is warm and dry.     Findings: No erythema or rash.  Neurological:     General: No focal deficit present.     Mental Status: She is alert and oriented to person, place, and time.     Cranial Nerves: No cranial nerve deficit.     Motor: No abnormal muscle tone.     Coordination: Coordination normal.     Deep Tendon Reflexes: Reflexes are normal and symmetric. Reflexes normal.  Psychiatric:        Mood and Affect: Mood normal.        Behavior: Behavior normal.        Thought Content: Thought content normal.        Judgment: Judgment normal.           Assessment & Plan:  Well exam. We discussed diet  and exercise. Get fasting labs. Set up a colonoscopy. We will refer her to [facility] for the saccadic eye movements. Refer her to the Weight Management clinic.  Gershon Crane, MD

## 2023-05-19 ENCOUNTER — Encounter: Payer: Self-pay | Admitting: *Deleted

## 2023-05-20 ENCOUNTER — Encounter: Payer: Self-pay | Admitting: Family Medicine

## 2023-05-24 ENCOUNTER — Encounter: Payer: PPO | Admitting: Family Medicine

## 2023-05-25 ENCOUNTER — Encounter: Payer: Self-pay | Admitting: Cardiology

## 2023-05-25 NOTE — Telephone Encounter (Signed)
 Spoke to patient she wanted to ask if ok to take Trieaprin to help elevated triglycerides.Message sent to PharmD for advice.

## 2023-05-26 ENCOUNTER — Other Ambulatory Visit: Payer: Self-pay

## 2023-05-26 DIAGNOSIS — E785 Hyperlipidemia, unspecified: Secondary | ICD-10-CM

## 2023-05-26 NOTE — Telephone Encounter (Signed)
 Spoke to patient PharmD's advice given.She will have a fasting lipid panel done 30 days after taking Tricaprin.

## 2023-05-26 NOTE — Progress Notes (Signed)
 lipid

## 2023-06-16 ENCOUNTER — Ambulatory Visit

## 2023-06-16 VITALS — Ht 64.0 in | Wt 238.0 lb

## 2023-06-16 DIAGNOSIS — Z0000 Encounter for general adult medical examination without abnormal findings: Secondary | ICD-10-CM | POA: Diagnosis not present

## 2023-06-16 DIAGNOSIS — Z0289 Encounter for other administrative examinations: Secondary | ICD-10-CM

## 2023-06-16 NOTE — Progress Notes (Signed)
 Subjective:   Lori Durham is a 73 y.o. who presents for a Medicare Wellness preventive visit.  Visit Complete: Virtual I connected with  [name] on 06/18/23 by a audio enabled telemedicine application and verified that I am speaking with the correct person using two identifiers.  Patient Location: Home  Provider Location: Home Office  I discussed the limitations of evaluation and management by telemedicine. The patient expressed understanding and agreed to proceed.  Vital Signs: Because this visit was a virtual/telehealth visit, some criteria may be missing or patient reported. Any vitals not documented were not able to be obtained and vitals that have been documented are patient reported.  VideoDeclined- This patient declined Librarian, academic. Therefore the visit was completed with audio only.  Persons Participating in Visit: Patient.  AWV Questionnaire: No: Patient Medicare AWV questionnaire was not completed prior to this visit.  Cardiac Risk Factors include: advanced age (>37men, >10 women);hypertension     Objective:    Today's Vitals   06/16/23 1144  Weight: 238 lb (108 kg)  Height: 5\' 4"  (1.626 m)   Body mass index is 40.85 kg/m.     06/16/2023   11:52 AM 01/26/2018   10:30 AM 10/09/2016   11:14 AM 08/10/2014   12:02 PM  Advanced Directives  Does Patient Have a Medical Advance Directive? Yes No No Yes  Type of Estate agent of [place];Living will   Healthcare Power of [place];Living will  Copy of Healthcare Power of Attorney in Chart? No - copy requested   No - copy requested  Would patient like information on creating a medical advance directive?  No - Patient declined      Current Medications (verified) Outpatient Encounter Medications as of 06/16/2023  Medication Sig   acyclovir (ZOVIRAX) 800 MG tablet Take 1 tablet (800 mg total) by mouth 3 (three) times daily.   acyclovir ointment (ZOVIRAX) 5 %  Apply 1 Application topically every 3 (three) hours.   albuterol (VENTOLIN HFA) 108 (90 Base) MCG/ACT inhaler Inhale 2 puffs into the lungs every 4 (four) hours as needed for wheezing or shortness of breath.   ALPRAZolam (XANAX) 1 MG tablet TAKE ONE AND A HALF TABLETS BY MOUTH DAILY AS NEEDED   aspirin EC 81 MG tablet Take 1 tablet by mouth daily.   benzonatate (TESSALON) 200 MG capsule Take 1 capsule (200 mg total) by mouth every 6 (six) hours as needed for cough.   Cholecalciferol (VITAMIN D3) 1000 UNITS CAPS Take 1 capsule by mouth daily. Twice per week   Coenzyme Q10 (COQ-10 PO) Take by mouth every other day.   ezetimibe (ZETIA) 10 MG tablet Take 1 tablet (10 mg total) by mouth daily.   fish oil-omega-3 fatty acids 1000 MG capsule Take 2 g by mouth daily.   Glucosamine-Chondroit-Vit C-Mn (GLUCOSAMINE 1500 COMPLEX PO) Take 2 tablets by mouth daily.   ketoconazole (NIZORAL) 2 % cream Apply 1 application topically 2 (two) times daily.   levETIRAcetam (KEPPRA) 750 MG tablet TAKE 1 TABLET BY MOUTH TWICE A DAY   liothyronine (CYTOMEL) 5 MCG tablet TAKE 1 TABLET BY MOUTH DAILY   MAGNESIUM-ZINC PO Take by mouth.   metoprolol tartrate (LOPRESSOR) 25 MG tablet Take 25 mg by mouth 2 (two) times daily.   Multiple Vitamin (MULTIVITAMIN) tablet Take 1 tablet by mouth daily.   nystatin-triamcinolone ointment (MYCOLOG) Apply 1 Application topically 2 (two) times daily. BID until symptoms resolved   pantoprazole (PROTONIX) 40 MG  tablet Take 1 tablet (40 mg total) by mouth daily.   Probiotic Product (ALIGN PO) Take by mouth daily.   Probiotic Product (ALIGN) 4 MG CAPS Take 1 capsule by mouth daily.   raloxifene (EVISTA) 60 MG tablet TAKE 1 TABLET BY MOUTH DAILY   rosuvastatin (CRESTOR) 40 MG tablet Take 1 tablet (40 mg total) by mouth daily.   SYNTHROID 137 MCG tablet Take 1 tablet (137 mcg total) by mouth daily before breakfast.   No facility-administered encounter medications on file as of 06/16/2023.     Allergies (verified) Augmentin [amoxicillin-pot clavulanate], Duloxetine, Hydrocodone-acetaminophen, Iodinated contrast media, Amoxicillin, Latex, Doxycycline, and Hydrocodone   History: Past Medical History:  Diagnosis Date   Asthma, mild intermittent    CAD (coronary artery disease) 04/09/2021   she had a NSTEMI, 2 stents were placed   GERD (gastroesophageal reflux disease)    watches diet   Heart attack (HCC)    History of colon polyps    History of diabetes mellitus    no issue since gastric sleeve surgery and lost wt.   History of exercise stress test    ETT--  06-17-2007--  NORMAL   History of hypertension    no issue since gastric sleeve surgery and lost wt.   Hyperlipidemia    Hypothyroidism, postsurgical    OSA (obstructive sleep apnea)    MODERATE OSA PER STUDY 09-25-2009--- no longer uses cpap,  no issue since gastric sleeve surgery and lost wt.   Osteoporosis    last DEXA on 11-20-10 was normal    PONV (postoperative nausea and vomiting)    Right knee meniscal tear    Varicose veins of both lower extremities with pain    Vertigo, central    Vitamin D deficiency    Wears glasses    Past Surgical History:  Procedure Laterality Date   COLONOSCOPY  12/07/2011   per Dr. Rodena Piety at [place], clear (hx of adenomatous polyps), repeat in 5 yrs    CORONARY ANGIOPLASTY WITH STENT PLACEMENT  04/09/2021   done in [place]   KNEE ARTHROSCOPY Right 08/10/2014   Procedure: ARTHROSCOPY RIGHT KNEE WITH DEBRIDEMENT;  Surgeon: Durene Romans, MD;  Location: [facility];  Service: Orthopedics;  Laterality: Right;   LAPAROSCOPIC CHOLECYSTECTOMY  Nov 2012  [place]   LAPAROSCOPIC GASTRIC SLEEVE RESECTION  11-15-2009   [place]   THYROID LOBECTOMY Left 03/23/1989   benign   TONSILLECTOMY AND ADENOIDECTOMY  age 63   VAGINAL HYSTERECTOMY  03/23/1993   w/ ovaries intact   Family History  Problem Relation Age of Onset   Hypothyroidism Mother    Heart  disease Mother    Hypertension Mother    Alcohol abuse Father    Heart disease Maternal Grandmother    Alcohol abuse Paternal Grandfather    Alcohol abuse Brother    Heart disease Brother    Heart attack Brother 62   Alcohol abuse Brother    Osteoporosis Brother    Other Brother        varicose veins   Osteoporosis Brother    Arthritis Other        family hx of   Coronary artery disease Other        family history of   Hyperlipidemia Other        family history of   Hypertension Other        family history of   Social History   Socioeconomic History   Marital status:  Married    Spouse name: WR   Number of children: 1   Years of education: Not on file   Highest education level: Associate degree: academic program  Occupational History   Occupation: Field seismologist: Herbalist  AND  NURSERY    Comment: [employer]  Tobacco Use   Smoking status: Never   Smokeless tobacco: Never  Vaping Use   Vaping status: Never Used  Substance and Sexual Activity   Alcohol use: Yes    Comment: Occasional   Drug use: No   Sexual activity: Not on file  Other Topics Concern   Not on file  Social History Narrative   Patient is right-handed. She lives with her husband in a 2 level home. She drinks 1 cup of coffee a day. She does not exercise.. she is currently on STD with her job.   Social Drivers of Corporate investment banker Strain: Low Risk  (06/16/2023)   Overall Financial Resource Strain (CARDIA)    Difficulty of Paying Living Expenses: Not hard at all  Food Insecurity: No Food Insecurity (06/16/2023)   Hunger Vital Sign    Worried About Running Out of Food in the Last Year: Never true    Ran Out of Food in the Last Year: Never true  Transportation Needs: No Transportation Needs (06/16/2023)   PRAPARE - Administrator, Civil Service (Medical): No    Lack of Transportation (Non-Medical): No  Physical Activity: Inactive (06/16/2023)   Exercise  Vital Sign    Days of Exercise per Week: 0 days    Minutes of Exercise per Session: 0 min  Stress: No Stress Concern Present (06/16/2023)   [employer] of Occupational Health - Occupational Stress Questionnaire    Feeling of Stress : Not at all  Recent Concern: Stress - Stress Concern Present (05/10/2023)   [employer] of Occupational Health - Occupational Stress Questionnaire    Feeling of Stress : To some extent  Social Connections: Socially Integrated (06/16/2023)   Social Connection and Isolation Panel [NHANES]    Frequency of Communication with Friends and Family: More than three times a week    Frequency of Social Gatherings with Friends and Family: More than three times a week    Attends Religious Services: More than 4 times per year    Active Member of [employer] or Organizations: Yes    Attends Engineer, structural: More than 4 times per year    Marital Status: Married    Tobacco Counseling Counseling given: Not Answered    Clinical Intake:  Pre-visit preparation completed: Yes  Pain : No/denies pain     BMI - recorded: 40.85 Nutritional Status: BMI > 30  Obese Nutritional Risks: None Diabetes: No  Lab Results  Component Value Date   HGBA1C 5.7 05/11/2023   HGBA1C 5.4 11/06/2010   HGBA1C 5.6 06/19/2010     How often do you need to have someone help you when you read instructions, pamphlets, or other written materials from your doctor or pharmacy?: 1 - Never  Interpreter Needed?: No  Information entered by :: Theresa Mulligan LPN   Activities of Daily Living      06/16/2023   11:48 AM  In your present state of health, do you have any difficulty performing the following activities:  Hearing? 0  Vision? 0  Difficulty concentrating or making decisions? 0  Walking or climbing stairs? 0  Dressing or bathing? 0  Doing errands,  shopping? 0  Preparing Food and eating ? N  Using the Toilet? N  In the past six months, have you accidently leaked  urine? N  Do you have problems with loss of bowel control? N  Managing your Medications? N  Managing your Finances? N  Housekeeping or managing your Housekeeping? N    Patient Care Team: Nelwyn Salisbury, MD as PCP - [facility] [place], [place], [facility] (Pharmacist)  Indicate any recent Medical Services you may have received from other than [facility] providers in the past year (date may be approximate).     Assessment:   This is a routine wellness examination for Lori Durham.  Hearing/Vision screen Hearing Screening - Comments:: Denies hearing difficulties   Vision Screening - Comments:: Wears rx glasses - up to date with routine eye exams with  [facility]   Goals Addressed               This Visit's Progress     Help with my Saccadds (pt-stated)         Depression Screen      06/16/2023   11:48 AM 05/11/2023    9:11 AM 10/26/2022    2:05 PM 04/28/2022   10:17 AM 11/05/2016    3:00 PM  PHQ 2/9 Scores  PHQ - 2 Score 0 0 3 0 1  PHQ- 9 Score 0 0 9 1     Fall Risk      06/16/2023   11:49 AM 05/11/2023    9:11 AM 10/26/2022    2:04 PM 04/28/2022   10:18 AM 11/05/2016    3:00 PM  Fall Risk   Falls in the past year? 0 0 0 1 No  Number falls in past yr: 0 0 0 0   Injury with Fall? 0 0 0 0   Risk for fall due to : No Fall Risks No Fall Risks No Fall Risks No Fall Risks   Follow up Falls prevention discussed;Falls evaluation completed Falls evaluation completed Follow up appointment Falls evaluation completed     MEDICARE RISK AT HOME:   Medicare Risk at Home Any stairs in or around the home?: No If so, are there any without handrails?: No Home free of loose throw rugs in walkways, pet beds, electrical cords, etc?: Yes Adequate lighting in your home to reduce risk of falls?: Yes Life alert?: No Use of a cane, walker or w/c?: No Grab bars in the bathroom?: Yes Shower chair or bench in shower?: Yes Elevated toilet seat or a handicapped toilet?: No  TIMED UP AND GO:  Was the  test performed?  No  Cognitive Function: 6CIT completed        06/16/2023   11:52 AM  6CIT Screen  What Year? 0 points  What month? 0 points  What time? 0 points  Count back from 20 0 points  Months in reverse 0 points  Repeat phrase 0 points  Total Score 0 points    Immunizations Immunization History  Administered Date(s) Administered   Influenza Split 12/25/2011   Influenza Whole 12/05/2009   Influenza, High Dose Seasonal PF 01/11/2019   Influenza,inj,Quad PF,6+ Mos 02/17/2013, 12/14/2013, 12/10/2017   Influenza-Unspecified 12/21/2014, 04/24/2016, 01/14/2017, 12/08/2022   PFIZER(Purple Top)SARS-COV-2 Vaccination 04/29/2019, 05/20/2019   Pneumococcal Polysaccharide-23 08/13/2017   Zoster Recombinant(Shingrix) 08/12/2017, 11/26/2017   Zoster, Live 02/23/2013    Screening Tests Health Maintenance  Topic Date Due   Hepatitis C Screening  Never done   DTaP/Tdap/Td (1 - Tdap) Never done  Pneumonia Vaccine 17+ Years old (2 of 2 - PCV) 08/14/2018   COVID-19 Vaccine (3 - Pfizer risk series) 06/17/2019   MAMMOGRAM  10/09/2019   Colonoscopy  12/06/2021   Medicare Annual Wellness (AWV)  06/15/2024   INFLUENZA VACCINE  Completed   DEXA SCAN  Completed   Zoster Vaccines- Shingrix  Completed   HPV VACCINES  Aged Out    Health Maintenance  Health Maintenance Due  Topic Date Due   Hepatitis C Screening  Never done   DTaP/Tdap/Td (1 - Tdap) Never done   Pneumonia Vaccine 13+ Years old (2 of 2 - PCV) 08/14/2018   COVID-19 Vaccine (3 - Pfizer risk series) 06/17/2019   MAMMOGRAM  10/09/2019   Colonoscopy  12/06/2021   Health Maintenance Items Addressed:    Additional Screening:  Vision Screening: Recommended annual ophthalmology exams for early detection of glaucoma and other disorders of the eye.  Dental Screening: Recommended annual dental exams for proper oral hygiene  Community Resource Referral / Chronic Care Management: CRR required this visit?  No   CCM  required this visit?  No     Plan:     I have personally reviewed and noted the following in the patient's chart:   Medical and social history Use of alcohol, tobacco or illicit drugs  Current medications and supplements including opioid prescriptions. Patient is not currently taking opioid prescriptions. Functional ability and status Nutritional status Physical activity Advanced directives List of other physicians Hospitalizations, surgeries, and ER visits in previous 12 months Vitals Screenings to include cognitive, depression, and falls Referrals and appointments  In addition, I have reviewed and discussed with patient certain preventive protocols, quality metrics, and best practice recommendations. A written personalized care plan for preventive services as well as general preventive health recommendations were provided to patient.     Tillie Rung, LPN   6/96/2952   After Visit Summary: (MyChart) Due to this being a telephonic visit, the after visit summary with patients personalized plan was offered to patient via MyChart   Notes: Nothing significant to report at this time.

## 2023-06-16 NOTE — Patient Instructions (Addendum)
 Lori Durham , Thank you for taking time to come for your Medicare Wellness Visit. I appreciate your ongoing commitment to your health goals. Please review the following plan we discussed and let me know if I can assist you in the future.   Referrals/Orders/Follow-Ups/Clinician Recommendations:   This is a list of the screening recommended for you and due dates:  Health Maintenance  Topic Date Due   Hepatitis C Screening  Never done   DTaP/Tdap/Td vaccine (1 - Tdap) Never done   Pneumonia Vaccine (2 of 2 - PCV) 08/14/2018   COVID-19 Vaccine (3 - Pfizer risk series) 06/17/2019   Mammogram  10/09/2019   Colon Cancer Screening  12/06/2021   Medicare Annual Wellness Visit  06/15/2024   Flu Shot  Completed   DEXA scan (bone density measurement)  Completed   Zoster (Shingles) Vaccine  Completed   HPV Vaccine  Aged Out    Advanced directives: (Copy Requested) Please bring a copy of your health care power of attorney and living will to the office to be added to your chart at your convenience. You can mail to [facility] [address] [place], [place] [address] or email to [email or web address]  Next Medicare Annual Wellness Visit scheduled for next year: Yes

## 2023-06-17 ENCOUNTER — Encounter: Payer: Self-pay | Admitting: Family Medicine

## 2023-06-17 ENCOUNTER — Ambulatory Visit (INDEPENDENT_AMBULATORY_CARE_PROVIDER_SITE_OTHER): Admitting: Family Medicine

## 2023-06-17 VITALS — BP 146/82 | HR 63 | Temp 98.4°F | Ht 63.5 in | Wt 238.0 lb

## 2023-06-17 DIAGNOSIS — H5581 Saccadic eye movements: Secondary | ICD-10-CM | POA: Diagnosis not present

## 2023-06-17 DIAGNOSIS — Z9884 Bariatric surgery status: Secondary | ICD-10-CM | POA: Insufficient documentation

## 2023-06-17 DIAGNOSIS — E66813 Obesity, class 3: Secondary | ICD-10-CM | POA: Diagnosis not present

## 2023-06-17 DIAGNOSIS — Z6841 Body Mass Index (BMI) 40.0 and over, adult: Secondary | ICD-10-CM

## 2023-06-17 DIAGNOSIS — K219 Gastro-esophageal reflux disease without esophagitis: Secondary | ICD-10-CM

## 2023-06-17 DIAGNOSIS — I2510 Atherosclerotic heart disease of native coronary artery without angina pectoris: Secondary | ICD-10-CM | POA: Diagnosis not present

## 2023-06-17 NOTE — Progress Notes (Signed)
 Office: [phone]  /  Fax: [phone]   Initial Visit  Lori Durham was seen in clinic today to evaluate for obesity. She is interested in losing weight to improve overall health and reduce the risk of weight related complications. She presents today to review program treatment options, initial physical assessment, and evaluation.     She was referred by: PCP  When asked what else they would like to accomplish? She states: Improve energy levels and physical activity, Improve existing medical conditions, and Improve quality of life  Weight history:  2003 started to gain weight when son got married 2011 VSG done felt good at 175.  Maintained about 3 years.  Pre op weight: 295 lb  Slowly crept up.   HH keeps her from overeating Has to chew meat well and pills get stuck   When asked how has your weight affected you? She states: Contributed to medical problems, Having fatigue, and Having poor endurance  Some associated conditions: Hyperlipidemia and ASCVD  Contributing factors: Moderate to high levels of stress and Reduced physical activity  Weight promoting medications identified: None  Current nutrition plan: None  Current level of physical activity: Limited due to chronic pain or orthopedic problems  Current or previous pharmacotherapy: GLP-1  Response to medication: Had side effects so it was discontinued Wegovy --> nausea  Past medical history includes:   Past Medical History:  Diagnosis Date   Asthma, mild intermittent    CAD (coronary artery disease) 04/09/2021   she had a NSTEMI, 2 stents were placed   GERD (gastroesophageal reflux disease)    watches diet   Heart attack (HCC)    History of colon polyps    History of diabetes mellitus    no issue since gastric sleeve surgery and lost wt.   History of exercise stress test    ETT--  06-17-2007--  NORMAL   History of hypertension    no issue since gastric sleeve surgery and lost wt.   Hyperlipidemia     Hypothyroidism, postsurgical    OSA (obstructive sleep apnea)    MODERATE OSA PER STUDY 09-25-2009--- no longer uses cpap,  no issue since gastric sleeve surgery and lost wt.   Osteoporosis    last DEXA on 11-20-10 was normal    PONV (postoperative nausea and vomiting)    Right knee meniscal tear    Varicose veins of both lower extremities with pain    Vertigo, central    Vitamin D deficiency    Wears glasses      Objective:   BP (!) 146/82   Pulse 63   Temp 98.4 F (36.9 C)   Ht 5' 3.5" (1.613 m)   Wt 238 lb (108 kg)   SpO2 97%   BMI 41.50 kg/m  She was weighed on the bioimpedance scale: Body mass index is 41.5 kg/m.  Peak Weight:238 , Body Fat%:53.2, Visceral Fat Rating:19, Weight trend over the last 12 months: Unchanged  General:  Alert, oriented and cooperative. Patient is in no acute distress.  Respiratory: Normal respiratory effort, no problems with respiration noted   Gait: able to ambulate independently  Mental Status: Normal mood and affect. Normal behavior. Normal judgment and thought content.   DIAGNOSTIC DATA REVIEWED:  BMET    Component Value Date/Time   NA 141 05/11/2023 1028   K 4.3 05/11/2023 1028   CL 103 05/11/2023 1028   CO2 29 05/11/2023 1028   GLUCOSE 111 (H) 05/11/2023 1028   BUN 13 05/11/2023  1028   CREATININE 0.70 05/11/2023 1028   CALCIUM 9.4 05/11/2023 1028   CALCIUM 9.8 06/19/2010 0924   GFRNONAA >60 01/28/2018 0432   GFRAA >60 01/28/2018 0432   Lab Results  Component Value Date   HGBA1C 5.7 05/11/2023   HGBA1C 6.0 10/10/2009   No results found for: "INSULIN" CBC    Component Value Date/Time   WBC 7.9 05/11/2023 1028   RBC 4.15 05/11/2023 1028   HGB 14.7 05/11/2023 1028   HCT 42.6 05/11/2023 1028   PLT 251.0 05/11/2023 1028   MCV 102.5 (H) 05/11/2023 1028   MCH 34.8 (H) 01/26/2018 1236   MCHC 34.5 05/11/2023 1028   RDW 11.9 05/11/2023 1028   Iron/TIBC/Ferritin/ %Sat    Component Value Date/Time   FERRITIN 220.0  06/19/2010 0924   Lipid Panel     Component Value Date/Time   CHOL 137 05/11/2023 1028   CHOL 163 10/20/2022 1058   TRIG 152.0 (H) 05/11/2023 1028   HDL 59.50 05/11/2023 1028   HDL 65 10/20/2022 1058   CHOLHDL 2 05/11/2023 1028   VLDL 30.4 05/11/2023 1028   LDLCALC 48 05/11/2023 1028   LDLCALC 70 10/20/2022 1058   LDLDIRECT 149.6 02/17/2013 0806   Hepatic Function Panel     Component Value Date/Time   PROT 7.2 05/11/2023 1028   PROT 6.7 10/20/2022 1058   ALBUMIN 4.2 05/11/2023 1028   ALBUMIN 4.6 10/20/2022 1058   AST 22 05/11/2023 1028   ALT 22 05/11/2023 1028   ALKPHOS 78 05/11/2023 1028   BILITOT 1.0 05/11/2023 1028   BILITOT 0.9 10/20/2022 1058   BILIDIR 0.2 05/11/2023 1028   BILIDIR 0.23 10/20/2022 1058      Component Value Date/Time   TSH 0.03 (L) 05/11/2023 1028     Assessment and Plan:   Coronary artery disease involving native coronary artery of native heart without angina pectoris Managed by Dr Jens Som She was tried on [place] last year given her hx of AMI She stopped due to nausea She denies angina  Continue current meds per cards  Class 3 severe obesity due to excess calories with serious comorbidity and body mass index (BMI) of 40.0 to 44.9 in adult (HCC)  Abnormal saccadic eye movement This has greatly limited her ability to exercise which has hurt her ability to exercise This has contributed to weight gain  S/P laparoscopic sleeve gastrectomy Reviewed her bariatric surgery hx with Dr Adolphus Birchwood She has some volume restriction with meals and a hiatal hernia that limits quantity of food She is not tracking calories or doing any exercise  Gastroesophageal reflux disease, unspecified whether esophagitis present Controlled on pantoprazole 40 mg daily with breakthrough with any missed doses       Obesity Treatment / Action Plan:  Patient will work on garnering support from family and friends to begin weight loss journey. Will work on eliminating  or reducing the presence of highly palatable, calorie dense foods in the home. Will complete provided nutritional and psychosocial assessment questionnaire before the next appointment. Will be scheduled for indirect calorimetry to determine resting energy expenditure in a fasting state.  This will allow [place] to create a reduced calorie, high-protein meal plan to promote loss of fat mass while preserving muscle mass. Will think about ideas on how to incorporate physical activity into their daily routine. Counseled on the health benefits of losing 5%-15% of total body weight. Was counseled on nutritional approaches to weight loss and benefits of reducing processed foods and consuming plant-based foods  and high quality protein as part of nutritional weight management. Was counseled on pharmacotherapy and role as an adjunct in weight management.   Obesity Education Performed Today:  She was weighed on the bioimpedance scale and results were discussed and documented in the synopsis.  We discussed obesity as a disease and the importance of a more detailed evaluation of all the factors contributing to the disease.  We discussed the importance of long term lifestyle changes which include nutrition, exercise and behavioral modifications as well as the importance of customizing this to her specific health and social needs.  We discussed the benefits of reaching a healthier weight to alleviate the symptoms of existing conditions and reduce the risks of the biomechanical, metabolic and psychological effects of obesity.  Lori Durham appears to be in the action stage of change and states they are ready to start intensive lifestyle modifications and behavioral modifications.  30 minutes was spent today on this visit including the above counseling, pre-visit chart review, and post-visit documentation.  Reviewed by clinician on day of visit: allergies, medications, problem list, medical history, surgical  history, family history, social history, and previous encounter notes pertinent to obesity diagnosis.    Seymour Bars, D.O. DABFM, [facility] [address] [place], [place] [address] [phone]

## 2023-06-18 NOTE — Progress Notes (Signed)
 Subjective:   Lori Durham is a 73 y.o. who presents for a Medicare Wellness preventive visit.  Visit Complete: Virtual I connected with  [name] on 06/18/23 by a audio enabled telemedicine application and verified that I am speaking with the correct person using two identifiers.  Patient Location: Home  Provider Location: Home Office  I discussed the limitations of evaluation and management by telemedicine. The patient expressed understanding and agreed to proceed.  Vital Signs: Because this visit was a virtual/telehealth visit, some criteria may be missing or patient reported. Any vitals not documented were not able to be obtained and vitals that have been documented are patient reported.  VideoDeclined- This patient declined Librarian, academic. Therefore the visit was completed with audio only.  Persons Participating in Visit: Patient.  AWV Questionnaire: No: Patient Medicare AWV questionnaire was not completed prior to this visit.  Cardiac Risk Factors include: advanced age (>37men, >10 women);hypertension     Objective:    Today's Vitals   06/16/23 1144  Weight: 238 lb (108 kg)  Height: 5\' 4"  (1.626 m)   Body mass index is 40.85 kg/m.     06/16/2023   11:52 AM 01/26/2018   10:30 AM 10/09/2016   11:14 AM 08/10/2014   12:02 PM  Advanced Directives  Does Patient Have a Medical Advance Directive? Yes No No Yes  Type of Estate agent of [place];Living will   Healthcare Power of [place];Living will  Copy of Healthcare Power of Attorney in Chart? No - copy requested   No - copy requested  Would patient like information on creating a medical advance directive?  No - Patient declined      Current Medications (verified) Outpatient Encounter Medications as of 06/16/2023  Medication Sig   acyclovir (ZOVIRAX) 800 MG tablet Take 1 tablet (800 mg total) by mouth 3 (three) times daily.   acyclovir ointment (ZOVIRAX) 5 %  Apply 1 Application topically every 3 (three) hours.   albuterol (VENTOLIN HFA) 108 (90 Base) MCG/ACT inhaler Inhale 2 puffs into the lungs every 4 (four) hours as needed for wheezing or shortness of breath.   ALPRAZolam (XANAX) 1 MG tablet TAKE ONE AND A HALF TABLETS BY MOUTH DAILY AS NEEDED   aspirin EC 81 MG tablet Take 1 tablet by mouth daily.   benzonatate (TESSALON) 200 MG capsule Take 1 capsule (200 mg total) by mouth every 6 (six) hours as needed for cough.   Cholecalciferol (VITAMIN D3) 1000 UNITS CAPS Take 1 capsule by mouth daily. Twice per week   Coenzyme Q10 (COQ-10 PO) Take by mouth every other day.   ezetimibe (ZETIA) 10 MG tablet Take 1 tablet (10 mg total) by mouth daily.   fish oil-omega-3 fatty acids 1000 MG capsule Take 2 g by mouth daily.   Glucosamine-Chondroit-Vit C-Mn (GLUCOSAMINE 1500 COMPLEX PO) Take 2 tablets by mouth daily.   ketoconazole (NIZORAL) 2 % cream Apply 1 application topically 2 (two) times daily.   levETIRAcetam (KEPPRA) 750 MG tablet TAKE 1 TABLET BY MOUTH TWICE A DAY   liothyronine (CYTOMEL) 5 MCG tablet TAKE 1 TABLET BY MOUTH DAILY   MAGNESIUM-ZINC PO Take by mouth.   metoprolol tartrate (LOPRESSOR) 25 MG tablet Take 25 mg by mouth 2 (two) times daily.   Multiple Vitamin (MULTIVITAMIN) tablet Take 1 tablet by mouth daily.   nystatin-triamcinolone ointment (MYCOLOG) Apply 1 Application topically 2 (two) times daily. BID until symptoms resolved   pantoprazole (PROTONIX) 40 MG  tablet Take 1 tablet (40 mg total) by mouth daily.   Probiotic Product (ALIGN PO) Take by mouth daily.   Probiotic Product (ALIGN) 4 MG CAPS Take 1 capsule by mouth daily.   raloxifene (EVISTA) 60 MG tablet TAKE 1 TABLET BY MOUTH DAILY   rosuvastatin (CRESTOR) 40 MG tablet Take 1 tablet (40 mg total) by mouth daily.   SYNTHROID 137 MCG tablet Take 1 tablet (137 mcg total) by mouth daily before breakfast.   No facility-administered encounter medications on file as of 06/16/2023.     Allergies (verified) Augmentin [amoxicillin-pot clavulanate], Duloxetine, Hydrocodone-acetaminophen, Iodinated contrast media, Amoxicillin, Latex, Doxycycline, and Hydrocodone   History: Past Medical History:  Diagnosis Date   Asthma, mild intermittent    CAD (coronary artery disease) 04/09/2021   she had a NSTEMI, 2 stents were placed   GERD (gastroesophageal reflux disease)    watches diet   Heart attack (HCC)    History of colon polyps    History of diabetes mellitus    no issue since gastric sleeve surgery and lost wt.   History of exercise stress test    ETT--  06-17-2007--  NORMAL   History of hypertension    no issue since gastric sleeve surgery and lost wt.   Hyperlipidemia    Hypothyroidism, postsurgical    OSA (obstructive sleep apnea)    MODERATE OSA PER STUDY 09-25-2009--- no longer uses cpap,  no issue since gastric sleeve surgery and lost wt.   Osteoporosis    last DEXA on 11-20-10 was normal    PONV (postoperative nausea and vomiting)    Right knee meniscal tear    Varicose veins of both lower extremities with pain    Vertigo, central    Vitamin D deficiency    Wears glasses    Past Surgical History:  Procedure Laterality Date   COLONOSCOPY  12/07/2011   per Dr. Rodena Piety at [place], clear (hx of adenomatous polyps), repeat in 5 yrs    CORONARY ANGIOPLASTY WITH STENT PLACEMENT  04/09/2021   done in [place]   KNEE ARTHROSCOPY Right 08/10/2014   Procedure: ARTHROSCOPY RIGHT KNEE WITH DEBRIDEMENT;  Surgeon: Durene Romans, MD;  Location: [facility];  Service: Orthopedics;  Laterality: Right;   LAPAROSCOPIC CHOLECYSTECTOMY  Nov 2012  [place]   LAPAROSCOPIC GASTRIC SLEEVE RESECTION  11-15-2009   [place]   THYROID LOBECTOMY Left 03/23/1989   benign   TONSILLECTOMY AND ADENOIDECTOMY  age 63   VAGINAL HYSTERECTOMY  03/23/1993   w/ ovaries intact   Family History  Problem Relation Age of Onset   Hypothyroidism Mother    Heart  disease Mother    Hypertension Mother    Alcohol abuse Father    Heart disease Maternal Grandmother    Alcohol abuse Paternal Grandfather    Alcohol abuse Brother    Heart disease Brother    Heart attack Brother 62   Alcohol abuse Brother    Osteoporosis Brother    Other Brother        varicose veins   Osteoporosis Brother    Arthritis Other        family hx of   Coronary artery disease Other        family history of   Hyperlipidemia Other        family history of   Hypertension Other        family history of   Social History   Socioeconomic History   Marital status:  Married    Spouse name: WR   Number of children: 1   Years of education: Not on file   Highest education level: Associate degree: academic program  Occupational History   Occupation: Field seismologist: Herbalist  AND  NURSERY    Comment: [employer]  Tobacco Use   Smoking status: Never   Smokeless tobacco: Never  Vaping Use   Vaping status: Never Used  Substance and Sexual Activity   Alcohol use: Yes    Comment: Occasional   Drug use: No   Sexual activity: Not on file  Other Topics Concern   Not on file  Social History Narrative   Patient is right-handed. She lives with her husband in a 2 level home. She drinks 1 cup of coffee a day. She does not exercise.. she is currently on STD with her job.   Social Drivers of Corporate investment banker Strain: Low Risk  (06/16/2023)   Overall Financial Resource Strain (CARDIA)    Difficulty of Paying Living Expenses: Not hard at all  Food Insecurity: No Food Insecurity (06/16/2023)   Hunger Vital Sign    Worried About Running Out of Food in the Last Year: Never true    Ran Out of Food in the Last Year: Never true  Transportation Needs: No Transportation Needs (06/16/2023)   PRAPARE - Administrator, Civil Service (Medical): No    Lack of Transportation (Non-Medical): No  Physical Activity: Inactive (06/16/2023)   Exercise  Vital Sign    Days of Exercise per Week: 0 days    Minutes of Exercise per Session: 0 min  Stress: No Stress Concern Present (06/16/2023)   [employer] of Occupational Health - Occupational Stress Questionnaire    Feeling of Stress : Not at all  Recent Concern: Stress - Stress Concern Present (05/10/2023)   [employer] of Occupational Health - Occupational Stress Questionnaire    Feeling of Stress : To some extent  Social Connections: Socially Integrated (06/16/2023)   Social Connection and Isolation Panel [NHANES]    Frequency of Communication with Friends and Family: More than three times a week    Frequency of Social Gatherings with Friends and Family: More than three times a week    Attends Religious Services: More than 4 times per year    Active Member of [employer] or Organizations: Yes    Attends Engineer, structural: More than 4 times per year    Marital Status: Married    Tobacco Counseling Counseling given: Not Answered    Clinical Intake:  Pre-visit preparation completed: Yes  Pain : No/denies pain     BMI - recorded: 40.85 Nutritional Status: BMI > 30  Obese Nutritional Risks: None Diabetes: No  Lab Results  Component Value Date   HGBA1C 5.7 05/11/2023   HGBA1C 5.4 11/06/2010   HGBA1C 5.6 06/19/2010     How often do you need to have someone help you when you read instructions, pamphlets, or other written materials from your doctor or pharmacy?: 1 - Never  Interpreter Needed?: No  Information entered by :: Theresa Mulligan LPN   Activities of Daily Living      06/16/2023   11:48 AM  In your present state of health, do you have any difficulty performing the following activities:  Hearing? 0  Vision? 0  Difficulty concentrating or making decisions? 0  Walking or climbing stairs? 0  Dressing or bathing? 0  Doing errands,  shopping? 0  Preparing Food and eating ? N  Using the Toilet? N  In the past six months, have you accidently leaked  urine? N  Do you have problems with loss of bowel control? N  Managing your Medications? N  Managing your Finances? N  Housekeeping or managing your Housekeeping? N    Patient Care Team: Nelwyn Salisbury, MD as PCP - [facility], [place], [facility] (Pharmacist)  Indicate any recent Medical Services you may have received from other than [facility] providers in the past year (date may be approximate).     Assessment:   This is a routine wellness examination for Charlaine.  Hearing/Vision screen Hearing Screening - Comments:: Denies hearing difficulties   Vision Screening - Comments:: Wears rx glasses - up to date with routine eye exams with  [facility]   Goals Addressed               This Visit's Progress     Help with my Saccadds (pt-stated)         Depression Screen      06/16/2023   11:48 AM 05/11/2023    9:11 AM 10/26/2022    2:05 PM 04/28/2022   10:17 AM 11/05/2016    3:00 PM  PHQ 2/9 Scores  PHQ - 2 Score 0 0 3 0 1  PHQ- 9 Score 0 0 9 1     Fall Risk      06/16/2023   11:49 AM 05/11/2023    9:11 AM 10/26/2022    2:04 PM 04/28/2022   10:18 AM 11/05/2016    3:00 PM  Fall Risk   Falls in the past year? 0 0 0 1 No  Number falls in past yr: 0 0 0 0   Injury with Fall? 0 0 0 0   Risk for fall due to : No Fall Risks No Fall Risks No Fall Risks No Fall Risks   Follow up Falls prevention discussed;Falls evaluation completed Falls evaluation completed Follow up appointment Falls evaluation completed     MEDICARE RISK AT HOME:   Medicare Risk at Home Any stairs in or around the home?: No If so, are there any without handrails?: No Home free of loose throw rugs in walkways, pet beds, electrical cords, etc?: Yes Adequate lighting in your home to reduce risk of falls?: Yes Life alert?: No Use of a cane, walker or w/c?: No Grab bars in the bathroom?: Yes Shower chair or bench in shower?: Yes Elevated toilet seat or a handicapped toilet?: No  TIMED UP AND GO:  Was the  test performed?  No  Cognitive Function: 6CIT completed        06/16/2023   11:52 AM  6CIT Screen  What Year? 0 points  What month? 0 points  What time? 0 points  Count back from 20 0 points  Months in reverse 0 points  Repeat phrase 0 points  Total Score 0 points    Immunizations Immunization History  Administered Date(s) Administered   Influenza Split 12/25/2011   Influenza Whole 12/05/2009   Influenza, High Dose Seasonal PF 01/11/2019   Influenza,inj,Quad PF,6+ Mos 02/17/2013, 12/14/2013, 12/10/2017   Influenza-Unspecified 12/21/2014, 04/24/2016, 01/14/2017, 12/08/2022   PFIZER(Purple Top)SARS-COV-2 Vaccination 04/29/2019, 05/20/2019   Pneumococcal Polysaccharide-23 08/13/2017   Zoster Recombinant(Shingrix) 08/12/2017, 11/26/2017   Zoster, Live 02/23/2013    Screening Tests Health Maintenance  Topic Date Due   Hepatitis C Screening  Never done   DTaP/Tdap/Td (1 - Tdap) Never done  Pneumonia Vaccine 17+ Years old (2 of 2 - PCV) 08/14/2018   COVID-19 Vaccine (3 - Pfizer risk series) 06/17/2019   MAMMOGRAM  10/09/2019   Colonoscopy  12/06/2021   Medicare Annual Wellness (AWV)  06/15/2024   INFLUENZA VACCINE  Completed   DEXA SCAN  Completed   Zoster Vaccines- Shingrix  Completed   HPV VACCINES  Aged Out    Health Maintenance  Health Maintenance Due  Topic Date Due   Hepatitis C Screening  Never done   DTaP/Tdap/Td (1 - Tdap) Never done   Pneumonia Vaccine 13+ Years old (2 of 2 - PCV) 08/14/2018   COVID-19 Vaccine (3 - Pfizer risk series) 06/17/2019   MAMMOGRAM  10/09/2019   Colonoscopy  12/06/2021   Health Maintenance Items Addressed:    Additional Screening:  Vision Screening: Recommended annual ophthalmology exams for early detection of glaucoma and other disorders of the eye.  Dental Screening: Recommended annual dental exams for proper oral hygiene  Community Resource Referral / Chronic Care Management: CRR required this visit?  No   CCM  required this visit?  No     Plan:     I have personally reviewed and noted the following in the patient's chart:   Medical and social history Use of alcohol, tobacco or illicit drugs  Current medications and supplements including opioid prescriptions. Patient is not currently taking opioid prescriptions. Functional ability and status Nutritional status Physical activity Advanced directives List of other physicians Hospitalizations, surgeries, and ER visits in previous 12 months Vitals Screenings to include cognitive, depression, and falls Referrals and appointments  In addition, I have reviewed and discussed with patient certain preventive protocols, quality metrics, and best practice recommendations. A written personalized care plan for preventive services as well as general preventive health recommendations were provided to patient.     Tillie Rung, LPN   6/96/2952   After Visit Summary: (MyChart) Due to this being a telephonic visit, the after visit summary with patients personalized plan was offered to patient via MyChart   Notes: Nothing significant to report at this time.

## 2023-07-19 ENCOUNTER — Ambulatory Visit: Admitting: Family Medicine

## 2023-07-28 ENCOUNTER — Encounter: Payer: Self-pay | Admitting: Family Medicine

## 2023-07-29 ENCOUNTER — Telehealth: Payer: Self-pay

## 2023-07-29 ENCOUNTER — Other Ambulatory Visit (HOSPITAL_COMMUNITY): Payer: Self-pay

## 2023-07-29 MED ORDER — TIRZEPATIDE-WEIGHT MANAGEMENT 2.5 MG/0.5ML ~~LOC~~ SOLN: 2.5000 mg | SUBCUTANEOUS | 2 refills | Status: AC

## 2023-07-29 NOTE — Telephone Encounter (Signed)
 I sent in some Zepbound for her to try

## 2023-07-29 NOTE — Telephone Encounter (Signed)
 Pharmacy Patient Advocate Encounter   Received notification from CoverMyMeds that prior authorization for  Zepbound 2.5MG /0.5ML pen-injectorsis required/requested.   Insurance verification completed.   The patient is insured through [facility] ADVANTAGE/RX ADVANCE .   Per test claim: PA required; PA submitted to above mentioned insurance via CoverMyMeds Key/confirmation #/EOC [id number] Status is pending

## 2023-07-29 NOTE — Telephone Encounter (Signed)
 Prior Authorization form/request asks a question that requires your assistance. Please see the question below and advise accordingly. The PA will not be submitted until the necessary information is received.  Please see patient media

## 2023-07-29 NOTE — Telephone Encounter (Signed)
 I sent in a RX for her to try Zepbound

## 2023-08-02 ENCOUNTER — Ambulatory Visit: Admitting: Family Medicine

## 2023-08-02 ENCOUNTER — Telehealth: Payer: Self-pay

## 2023-08-02 NOTE — Telephone Encounter (Signed)
 Pt notified of the denial for the Zepbound Rx

## 2023-08-04 DIAGNOSIS — D485 Neoplasm of uncertain behavior of skin: Secondary | ICD-10-CM | POA: Diagnosis not present

## 2023-08-04 DIAGNOSIS — D2339 Other benign neoplasm of skin of other parts of face: Secondary | ICD-10-CM | POA: Diagnosis not present

## 2023-08-04 DIAGNOSIS — L821 Other seborrheic keratosis: Secondary | ICD-10-CM | POA: Diagnosis not present

## 2023-08-04 DIAGNOSIS — L814 Other melanin hyperpigmentation: Secondary | ICD-10-CM | POA: Diagnosis not present

## 2023-08-04 DIAGNOSIS — L648 Other androgenic alopecia: Secondary | ICD-10-CM | POA: Diagnosis not present

## 2023-08-09 NOTE — Telephone Encounter (Signed)
 I recommend she talk to her insurance company to see if they will cover any of the GLP-1 medications

## 2023-08-10 DIAGNOSIS — H2513 Age-related nuclear cataract, bilateral: Secondary | ICD-10-CM | POA: Diagnosis not present

## 2023-08-18 ENCOUNTER — Encounter: Payer: Self-pay | Admitting: Family Medicine

## 2023-08-23 NOTE — Telephone Encounter (Signed)
 Possibly. Make an OV with me to discuss this

## 2023-08-25 ENCOUNTER — Ambulatory Visit (INDEPENDENT_AMBULATORY_CARE_PROVIDER_SITE_OTHER): Admitting: Family Medicine

## 2023-08-25 ENCOUNTER — Encounter: Payer: Self-pay | Admitting: Family Medicine

## 2023-08-25 VITALS — BP 120/64 | HR 67 | Temp 98.4°F | Wt 249.0 lb

## 2023-08-25 DIAGNOSIS — B009 Herpesviral infection, unspecified: Secondary | ICD-10-CM

## 2023-08-25 NOTE — Progress Notes (Signed)
   Subjective:    Patient ID: Lori Durham, female    DOB: 06-Feb-1951, 73 y.o.   MRN: [id number]  HPI Here to ask some questions about dementia and to say goodbye. She will be moving with her husband to [place], [place] in the next few months. She has dealt with episodes of fever blisters around her mouth since she was a child, and she has used Acyclovir  and Valacyclovir to treat them. She recently read about a cohort study out of the [place] [facility] [place]  that examined the electronic medical records of 120 million patients across the [place] . This study found that patients that had been diagnosed with herpes simplex virus (either type 1 or2) have a 2.44 times greater risk of developing dementia in their lifetime that patients without this diagnosis. She asks my opinion of this study.   Review of Systems  Constitutional: Negative.   Respiratory: Negative.    Cardiovascular: Negative.   Neurological: Negative.        Objective:   Physical Exam Constitutional:      Appearance: Normal appearance.  Cardiovascular:     Rate and Rhythm: Normal rate and regular rhythm.     Pulses: Normal pulses.     Heart sounds: Normal heart sounds.  Pulmonary:     Effort: Pulmonary effort is normal.     Breath sounds: Normal breath sounds.  Neurological:     Mental Status: She is alert and oriented to person, place, and time. Mental status is at baseline.           Assessment & Plan:  We discussed the possible links between herpes simplex infections and dementia. Of course, lots of research is ongoing into the causes of dementia. She will be moving away soon, but I told her we could still have virtual visits if needed until she gets established with a new PCP.  Corita Diego, MD

## 2023-09-08 ENCOUNTER — Other Ambulatory Visit (HOSPITAL_COMMUNITY): Payer: Self-pay

## 2023-09-08 NOTE — Telephone Encounter (Signed)
 Pharmacy Patient Advocate Encounter  Received notification from [employer]/RX ADVANCE that Prior Authorization for Zepbound  2.5MG /0.5ML pen-injectors  has been DENIED.  See denial reason below. No denial letter attached in CMM. Will attach denial letter to Media tab once received.  Your request was denied because it is being used for an indication which is NOT approved or medically-accepted: Class 3 severe obesity (BMI 40.0-44.9) with serious comorbidity and obstructive sleep apnea (OSA). Plan requirements for the approval

## 2023-10-04 ENCOUNTER — Encounter: Payer: Self-pay | Admitting: Cardiology

## 2023-10-04 MED ORDER — METOPROLOL TARTRATE 25 MG PO TABS: 25.0000 mg | ORAL_TABLET | Freq: Two times a day (BID) | ORAL | 3 refills | Status: AC

## 2023-10-04 MED ORDER — EZETIMIBE 10 MG PO TABS: 10.0000 mg | ORAL_TABLET | Freq: Every day | ORAL | 3 refills | Status: AC

## 2023-10-07 ENCOUNTER — Telehealth: Payer: Self-pay

## 2023-10-07 NOTE — Telephone Encounter (Signed)
 Spoke with Arland pharmacist with [employer] stated that pt has moved to the area and has transferred all prescriptions to this pharmacy, advise of the diagnoses code for Zepbound . Copied from CRM [id number]. Topic: Clinical - Medication Question >> Oct 05, 2023 12:51 PM Viola F wrote: Reason for CRM: Arland from [employer] called requesting a diagnosis for patients Zepbound . Please call her back at [phone]

## 2023-12-07 ENCOUNTER — Telehealth: Payer: Self-pay

## 2023-12-07 NOTE — Progress Notes (Signed)
   12/07/2023  Patient ID: Lori Durham, female   DOB: Nov 11, 1950, 73 y.o.   MRN: [id number]  Pharmacy Quality Measure Review  This patient is appearing on a report for being at risk of failing the adherence measure for cholesterol (statin) medications this calendar year.   Medication: Rosuvastatin  40mg  Last fill date: 07/14/23 for 90 day supply  Left voicemail for patient to return my call at their convenience.  Jon VEAR Lindau, PharmD Clinical Pharmacist [phone]

## 2024-06-21 ENCOUNTER — Ambulatory Visit
